# Patient Record
Sex: Female | Born: 1975 | Race: White | Hispanic: No | Marital: Single
Health system: Southern US, Academic
[De-identification: ages and names within clinical notes are randomized; demographics above are authoritative.]

## PROBLEM LIST (undated history)

## (undated) ENCOUNTER — Encounter: Attending: Infectious Disease | Primary: Infectious Disease

## (undated) ENCOUNTER — Ambulatory Visit: Payer: Medicaid (Managed Care)

## (undated) ENCOUNTER — Encounter

## (undated) ENCOUNTER — Ambulatory Visit: Payer: PRIVATE HEALTH INSURANCE | Attending: Internal Medicine | Primary: Internal Medicine

## (undated) ENCOUNTER — Ambulatory Visit

## (undated) ENCOUNTER — Ambulatory Visit: Attending: Social Worker | Primary: Social Worker

## (undated) ENCOUNTER — Encounter: Attending: Internal Medicine | Primary: Internal Medicine

## (undated) ENCOUNTER — Encounter
Attending: Student in an Organized Health Care Education/Training Program | Primary: Student in an Organized Health Care Education/Training Program

## (undated) ENCOUNTER — Ambulatory Visit
Attending: Rehabilitative and Restorative Service Providers" | Primary: Rehabilitative and Restorative Service Providers"

## (undated) ENCOUNTER — Ambulatory Visit: Attending: Foot & Ankle Surgery | Primary: Foot & Ankle Surgery

## (undated) ENCOUNTER — Ambulatory Visit: Attending: Internal Medicine | Primary: Internal Medicine

## (undated) ENCOUNTER — Telehealth: Attending: Internal Medicine | Primary: Internal Medicine

## (undated) ENCOUNTER — Telehealth

## (undated) ENCOUNTER — Ambulatory Visit: Attending: Registered" | Primary: Registered"

## (undated) ENCOUNTER — Ambulatory Visit: Payer: PRIVATE HEALTH INSURANCE | Attending: Adult Health | Primary: Adult Health

## (undated) ENCOUNTER — Encounter: Attending: Nurse Practitioner | Primary: Nurse Practitioner

## (undated) ENCOUNTER — Encounter: Attending: Registered" | Primary: Registered"

## (undated) ENCOUNTER — Telehealth
Attending: Student in an Organized Health Care Education/Training Program | Primary: Student in an Organized Health Care Education/Training Program

## (undated) ENCOUNTER — Ambulatory Visit: Attending: Vascular Surgery | Primary: Vascular Surgery

## (undated) ENCOUNTER — Encounter: Attending: Dermatology | Primary: Dermatology

## (undated) ENCOUNTER — Ambulatory Visit
Payer: Medicaid (Managed Care) | Attending: Rehabilitative and Restorative Service Providers" | Primary: Rehabilitative and Restorative Service Providers"

## (undated) ENCOUNTER — Ambulatory Visit: Payer: PRIVATE HEALTH INSURANCE

## (undated) ENCOUNTER — Encounter: Attending: Diagnostic Radiology | Primary: Diagnostic Radiology

## (undated) ENCOUNTER — Encounter
Attending: Rehabilitative and Restorative Service Providers" | Primary: Rehabilitative and Restorative Service Providers"

## (undated) ENCOUNTER — Encounter: Attending: Obstetrics & Gynecology | Primary: Obstetrics & Gynecology

## (undated) ENCOUNTER — Ambulatory Visit
Payer: BLUE CROSS/BLUE SHIELD | Attending: Student in an Organized Health Care Education/Training Program | Primary: Student in an Organized Health Care Education/Training Program

## (undated) ENCOUNTER — Ambulatory Visit
Payer: PRIVATE HEALTH INSURANCE | Attending: Rehabilitative and Restorative Service Providers" | Primary: Rehabilitative and Restorative Service Providers"

## (undated) ENCOUNTER — Encounter: Attending: Medical | Primary: Medical

## (undated) ENCOUNTER — Encounter: Payer: Medicaid (Managed Care) | Attending: Internal Medicine | Primary: Internal Medicine

## (undated) ENCOUNTER — Encounter: Attending: Adult Health | Primary: Adult Health

## (undated) ENCOUNTER — Encounter: Attending: Rheumatology | Primary: Rheumatology

## (undated) ENCOUNTER — Ambulatory Visit
Attending: Student in an Organized Health Care Education/Training Program | Primary: Student in an Organized Health Care Education/Training Program

## (undated) ENCOUNTER — Ambulatory Visit: Payer: BLUE CROSS/BLUE SHIELD | Attending: Internal Medicine | Primary: Internal Medicine

## (undated) ENCOUNTER — Ambulatory Visit: Payer: PRIVATE HEALTH INSURANCE | Attending: Obstetrics & Gynecology | Primary: Obstetrics & Gynecology

## (undated) ENCOUNTER — Ambulatory Visit
Payer: PRIVATE HEALTH INSURANCE | Attending: Student in an Organized Health Care Education/Training Program | Primary: Student in an Organized Health Care Education/Training Program

## (undated) ENCOUNTER — Ambulatory Visit: Payer: PRIVATE HEALTH INSURANCE | Attending: Rheumatology | Primary: Rheumatology

## (undated) ENCOUNTER — Inpatient Hospital Stay: Payer: Medicaid (Managed Care)

## (undated) ENCOUNTER — Ambulatory Visit: Attending: Rheumatology | Primary: Rheumatology

## (undated) ENCOUNTER — Institutional Professional Consult (permissible substitution): Attending: Internal Medicine | Primary: Internal Medicine

## (undated) ENCOUNTER — Ambulatory Visit: Payer: Medicaid (Managed Care) | Attending: Internal Medicine | Primary: Internal Medicine

## (undated) ENCOUNTER — Telehealth: Attending: Vascular Surgery | Primary: Vascular Surgery

## (undated) ENCOUNTER — Ambulatory Visit: Attending: Obstetrics & Gynecology | Primary: Obstetrics & Gynecology

## (undated) ENCOUNTER — Encounter: Attending: Psychiatry | Primary: Psychiatry

## (undated) ENCOUNTER — Encounter: Payer: Medicaid (Managed Care) | Attending: Psychiatry | Primary: Psychiatry

## (undated) ENCOUNTER — Ambulatory Visit
Payer: BLUE CROSS/BLUE SHIELD | Attending: Physical Medicine & Rehabilitation | Primary: Physical Medicine & Rehabilitation

## (undated) ENCOUNTER — Ambulatory Visit: Payer: BLUE CROSS/BLUE SHIELD

## (undated) ENCOUNTER — Inpatient Hospital Stay

## (undated) ENCOUNTER — Encounter: Attending: Vascular Surgery | Primary: Vascular Surgery

## (undated) ENCOUNTER — Telehealth: Attending: Ambulatory Care | Primary: Ambulatory Care

## (undated) DIAGNOSIS — K219 Gastro-esophageal reflux disease without esophagitis: Secondary | ICD-10-CM

## (undated) DIAGNOSIS — S83519A Sprain of anterior cruciate ligament of unspecified knee, initial encounter: Secondary | ICD-10-CM

## (undated) DIAGNOSIS — K529 Noninfective gastroenteritis and colitis, unspecified: Secondary | ICD-10-CM

## (undated) DIAGNOSIS — M359 Systemic involvement of connective tissue, unspecified: Secondary | ICD-10-CM

## (undated) DIAGNOSIS — R519 Headache, unspecified: Secondary | ICD-10-CM

## (undated) DIAGNOSIS — K5792 Diverticulitis of intestine, part unspecified, without perforation or abscess without bleeding: Secondary | ICD-10-CM

## (undated) DIAGNOSIS — R51 Headache: Secondary | ICD-10-CM

## (undated) DIAGNOSIS — F419 Anxiety disorder, unspecified: Secondary | ICD-10-CM

## (undated) DIAGNOSIS — S83209A Unspecified tear of unspecified meniscus, current injury, unspecified knee, initial encounter: Secondary | ICD-10-CM

## (undated) DIAGNOSIS — I7300 Raynaud's syndrome without gangrene: Secondary | ICD-10-CM

## (undated) DIAGNOSIS — F329 Major depressive disorder, single episode, unspecified: Secondary | ICD-10-CM

## (undated) DIAGNOSIS — M1990 Unspecified osteoarthritis, unspecified site: Secondary | ICD-10-CM

## (undated) DIAGNOSIS — Q796 Ehlers-Danlos syndrome, unspecified: Secondary | ICD-10-CM

## (undated) DIAGNOSIS — F32A Depression, unspecified: Secondary | ICD-10-CM

## (undated) DIAGNOSIS — I499 Cardiac arrhythmia, unspecified: Secondary | ICD-10-CM

## (undated) DIAGNOSIS — F410 Panic disorder [episodic paroxysmal anxiety] without agoraphobia: Secondary | ICD-10-CM

## (undated) HISTORY — PX: FOOT SURGERY: SHX648

## (undated) HISTORY — PX: TUBAL LIGATION: SHX77

---
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
## 1997-05-11 ENCOUNTER — Inpatient Hospital Stay (HOSPITAL_COMMUNITY): Admission: AD | Admit: 1997-05-11 | Discharge: 1997-05-11 | Payer: Self-pay | Admitting: Podiatrist

## 1997-06-02 ENCOUNTER — Other Ambulatory Visit: Admission: RE | Admit: 1997-06-02 | Discharge: 1997-06-02 | Payer: Self-pay | Admitting: Obstetrics and Gynecology

## 1997-10-14 ENCOUNTER — Emergency Department (HOSPITAL_COMMUNITY): Admission: EM | Admit: 1997-10-14 | Discharge: 1997-10-14 | Payer: Self-pay | Admitting: Emergency Medicine

## 1997-12-09 ENCOUNTER — Inpatient Hospital Stay (HOSPITAL_COMMUNITY): Admission: AD | Admit: 1997-12-09 | Discharge: 1997-12-09 | Payer: Self-pay | Admitting: Obstetrics and Gynecology

## 1997-12-16 ENCOUNTER — Inpatient Hospital Stay (HOSPITAL_COMMUNITY): Admission: AD | Admit: 1997-12-16 | Discharge: 1997-12-18 | Payer: Self-pay | Admitting: Obstetrics and Gynecology

## 1997-12-19 ENCOUNTER — Inpatient Hospital Stay (HOSPITAL_COMMUNITY): Admission: AD | Admit: 1997-12-19 | Discharge: 1997-12-19 | Payer: Self-pay | Admitting: Obstetrics and Gynecology

## 1998-01-29 ENCOUNTER — Other Ambulatory Visit: Admission: RE | Admit: 1998-01-29 | Discharge: 1998-01-29 | Payer: Self-pay | Admitting: Obstetrics and Gynecology

## 1999-02-18 ENCOUNTER — Other Ambulatory Visit: Admission: RE | Admit: 1999-02-18 | Discharge: 1999-02-18 | Payer: Self-pay | Admitting: Obstetrics and Gynecology

## 2007-07-09 ENCOUNTER — Inpatient Hospital Stay: Payer: Self-pay | Admitting: Internal Medicine

## 2007-08-08 ENCOUNTER — Emergency Department: Payer: Self-pay | Admitting: Emergency Medicine

## 2007-10-29 ENCOUNTER — Emergency Department: Payer: Self-pay | Admitting: Emergency Medicine

## 2008-04-04 ENCOUNTER — Emergency Department (HOSPITAL_COMMUNITY): Admission: EM | Admit: 2008-04-04 | Discharge: 2008-04-04 | Payer: Self-pay | Admitting: Emergency Medicine

## 2008-07-07 ENCOUNTER — Emergency Department: Payer: Self-pay | Admitting: Emergency Medicine

## 2009-01-31 ENCOUNTER — Emergency Department: Payer: Self-pay | Admitting: Emergency Medicine

## 2009-05-11 ENCOUNTER — Emergency Department: Payer: Self-pay | Admitting: Emergency Medicine

## 2010-02-18 ENCOUNTER — Emergency Department: Payer: Self-pay | Admitting: Emergency Medicine

## 2010-05-05 LAB — POCT I-STAT, CHEM 8
Glucose, Bld: 82 mg/dL (ref 70–99)
HCT: 44 % (ref 36.0–46.0)
Hemoglobin: 15 g/dL (ref 12.0–15.0)
Potassium: 3.9 mEq/L (ref 3.5–5.1)
Sodium: 140 mEq/L (ref 135–145)

## 2010-05-05 LAB — DIFFERENTIAL
Basophils Relative: 0 % (ref 0–1)
Eosinophils Absolute: 0.1 10*3/uL (ref 0.0–0.7)
Monocytes Relative: 5 % (ref 3–12)
Neutrophils Relative %: 76 % (ref 43–77)

## 2010-05-05 LAB — CBC
MCHC: 34.7 g/dL (ref 30.0–36.0)
MCV: 89.9 fL (ref 78.0–100.0)
Platelets: 168 10*3/uL (ref 150–400)
RDW: 12.7 % (ref 11.5–15.5)

## 2010-05-05 LAB — URINALYSIS, ROUTINE W REFLEX MICROSCOPIC
Bilirubin Urine: NEGATIVE
Ketones, ur: NEGATIVE mg/dL
Nitrite: NEGATIVE
Protein, ur: NEGATIVE mg/dL
Urobilinogen, UA: 1 mg/dL (ref 0.0–1.0)

## 2010-05-05 LAB — POCT CARDIAC MARKERS: CKMB, poc: <1 ng/mL — ABNORMAL LOW (ref 1.0–8.0)

## 2010-06-25 ENCOUNTER — Emergency Department: Payer: Self-pay | Admitting: Internal Medicine

## 2011-07-26 ENCOUNTER — Emergency Department: Payer: Self-pay

## 2012-08-16 ENCOUNTER — Emergency Department: Payer: Self-pay | Admitting: Emergency Medicine

## 2012-08-16 LAB — URINALYSIS, COMPLETE
Glucose,UR: NEGATIVE mg/dL (ref 0–75)
Leukocyte Esterase: NEGATIVE
Nitrite: NEGATIVE
Ph: 5 (ref 4.5–8.0)
Specific Gravity: 1.013 (ref 1.003–1.030)
Squamous Epithelial: 10

## 2012-08-16 LAB — CBC
HCT: 41.3 % (ref 35.0–47.0)
MCHC: 35 g/dL (ref 32.0–36.0)
MCV: 87 fL (ref 80–100)
RBC: 4.75 10*6/uL (ref 3.80–5.20)
RDW: 13.2 % (ref 11.5–14.5)
WBC: 14.6 10*3/uL — ABNORMAL HIGH (ref 3.6–11.0)

## 2012-08-16 LAB — COMPREHENSIVE METABOLIC PANEL
Alkaline Phosphatase: 106 U/L (ref 50–136)
BUN: 7 mg/dL (ref 7–18)
Bilirubin,Total: 0.7 mg/dL (ref 0.2–1.0)
Calcium, Total: 9.2 mg/dL (ref 8.5–10.1)
Chloride: 105 mmol/L (ref 98–107)
Co2: 29 mmol/L (ref 21–32)
Creatinine: 0.66 mg/dL (ref 0.60–1.30)
EGFR (African American): >60
EGFR (Non-African Amer.): >60
Glucose: 90 mg/dL (ref 65–99)
Potassium: 3.7 mmol/L (ref 3.5–5.1)
SGPT (ALT): 18 U/L (ref 12–78)
Sodium: 138 mmol/L (ref 136–145)

## 2012-11-17 ENCOUNTER — Emergency Department: Payer: Self-pay | Admitting: Emergency Medicine

## 2013-02-27 ENCOUNTER — Emergency Department: Payer: Self-pay | Admitting: Emergency Medicine

## 2013-02-27 LAB — COMPREHENSIVE METABOLIC PANEL
ALBUMIN: 3.9 g/dL (ref 3.4–5.0)
ALK PHOS: 101 U/L
Anion Gap: 5 — ABNORMAL LOW (ref 7–16)
BILIRUBIN TOTAL: 0.4 mg/dL (ref 0.2–1.0)
BUN: 10 mg/dL (ref 7–18)
CALCIUM: 8.9 mg/dL (ref 8.5–10.1)
CO2: 28 mmol/L (ref 21–32)
Chloride: 104 mmol/L (ref 98–107)
Creatinine: 0.65 mg/dL (ref 0.60–1.30)
EGFR (African American): >60
EGFR (Non-African Amer.): >60
Glucose: 89 mg/dL (ref 65–99)
OSMOLALITY: 272 (ref 275–301)
POTASSIUM: 4 mmol/L (ref 3.5–5.1)
SGOT(AST): 13 U/L — ABNORMAL LOW (ref 15–37)
SGPT (ALT): 19 U/L (ref 12–78)
SODIUM: 137 mmol/L (ref 136–145)
Total Protein: 7.7 g/dL (ref 6.4–8.2)

## 2013-02-27 LAB — CBC WITH DIFFERENTIAL/PLATELET
BASOS PCT: 0.7 %
Basophil #: 0.1 10*3/uL (ref 0.0–0.1)
EOS PCT: 1 %
Eosinophil #: 0.1 10*3/uL (ref 0.0–0.7)
HCT: 43.8 % (ref 35.0–47.0)
HGB: 14.8 g/dL (ref 12.0–16.0)
LYMPHS ABS: 2.5 10*3/uL (ref 1.0–3.6)
LYMPHS PCT: 25.1 %
MCH: 30.5 pg (ref 26.0–34.0)
MCHC: 33.9 g/dL (ref 32.0–36.0)
MCV: 90 fL (ref 80–100)
Monocyte #: 0.6 x10 3/mm (ref 0.2–0.9)
Monocyte %: 5.8 %
NEUTROS ABS: 6.7 10*3/uL — AB (ref 1.4–6.5)
NEUTROS PCT: 67.4 %
PLATELETS: 169 10*3/uL (ref 150–440)
RBC: 4.87 10*6/uL (ref 3.80–5.20)
RDW: 13.2 % (ref 11.5–14.5)
WBC: 9.9 10*3/uL (ref 3.6–11.0)

## 2013-02-27 LAB — URINALYSIS, COMPLETE
BLOOD: NEGATIVE
Bilirubin,UR: NEGATIVE
GLUCOSE, UR: NEGATIVE mg/dL (ref 0–75)
Ketone: NEGATIVE
NITRITE: NEGATIVE
PH: 5 (ref 4.5–8.0)
PROTEIN: NEGATIVE
RBC,UR: 3 /HPF (ref 0–5)
SPECIFIC GRAVITY: 1.009 (ref 1.003–1.030)

## 2013-04-18 ENCOUNTER — Encounter (HOSPITAL_COMMUNITY): Payer: Self-pay | Admitting: Emergency Medicine

## 2013-04-18 ENCOUNTER — Emergency Department (HOSPITAL_COMMUNITY)
Admission: EM | Admit: 2013-04-18 | Discharge: 2013-04-18 | Disposition: A | Discharge status: Home / Self Care | Payer: No Typology Code available for payment source | Source: Home / Self Care

## 2013-04-18 ENCOUNTER — Other Ambulatory Visit (HOSPITAL_COMMUNITY)
Admission: RE | Admit: 2013-04-18 | Discharge: 2013-04-18 | Disposition: A | Discharge status: Home / Self Care | Payer: No Typology Code available for payment source | Source: Ambulatory Visit | Attending: Emergency Medicine | Admitting: Emergency Medicine

## 2013-04-18 DIAGNOSIS — R109 Unspecified abdominal pain: Secondary | ICD-10-CM

## 2013-04-18 DIAGNOSIS — Z113 Encounter for screening for infections with a predominantly sexual mode of transmission: Secondary | ICD-10-CM | POA: Insufficient documentation

## 2013-04-18 HISTORY — DX: Ehlers-Danlos syndrome, unspecified: Q79.60

## 2013-04-18 HISTORY — DX: Diverticulitis of intestine, part unspecified, without perforation or abscess without bleeding: K57.92

## 2013-04-18 LAB — POCT I-STAT, CHEM 8
BUN: 13 mg/dL (ref 6–23)
CALCIUM ION: 1.06 mmol/L — AB (ref 1.12–1.23)
Chloride: 105 mEq/L (ref 96–112)
Creatinine, Ser: 0.6 mg/dL (ref 0.50–1.10)
GLUCOSE: 90 mg/dL (ref 70–99)
HEMATOCRIT: 44 % (ref 36.0–46.0)
HEMOGLOBIN: 15 g/dL (ref 12.0–15.0)
Potassium: 4 mEq/L (ref 3.7–5.3)
Sodium: 141 mEq/L (ref 137–147)
TCO2: 25 mmol/L (ref 0–100)

## 2013-04-18 LAB — POCT URINALYSIS DIP (DEVICE)
BILIRUBIN URINE: NEGATIVE
Glucose, UA: NEGATIVE mg/dL
KETONES UR: NEGATIVE mg/dL
LEUKOCYTES UA: NEGATIVE
NITRITE: NEGATIVE
PH: 5.5 (ref 5.0–8.0)
PROTEIN: NEGATIVE mg/dL
Specific Gravity, Urine: 1.025 (ref 1.005–1.030)
Urobilinogen, UA: 1 mg/dL (ref 0.0–1.0)

## 2013-04-18 LAB — POCT PREGNANCY, URINE: PREG TEST UR: NEGATIVE

## 2013-04-18 MED ORDER — CIPROFLOXACIN HCL 500 MG PO TABS: 500.0000 mg | ORAL_TABLET | Freq: Two times a day (BID) | ORAL | Status: DC

## 2013-04-18 MED ORDER — METRONIDAZOLE 500 MG PO TABS: 500.0000 mg | ORAL_TABLET | Freq: Three times a day (TID) | ORAL | Status: DC

## 2013-04-18 NOTE — Discharge Instructions (Signed)
Follow up with your primary care provider as needed for further evaluation and work up.

## 2013-04-18 NOTE — ED Notes (Signed)
Pt c/o lower abd pain/poss UTI onset 4 days Sxs include: constant abd pain, back pain, urinary freq and feeling nauseas Denies hematuria, vag d/c, inj/trauma, f/v/d Taking advil and tyle w/no relief Alert w/no signs of acute distress.

## 2013-04-18 NOTE — ED Provider Notes (Signed)
CSN: [id number]     Arrival date & time 04/18/13  1846 History   None    Chief Complaint  Patient presents with  . Abdominal Pain   (Consider location/radiation/quality/duration/timing/severity/associated sxs/prior Treatment)  HPI  The patient is a 38 year old female presenting tonight with reports of an uncomfortable or incomplete sensation following urination for the past few days. In addition the patient states that she has a history of diverticulitis.  Patient states that she is uncertain as to whether or not her discomfort is related to diverticulitis or the beginning of a urinary tract infection.  Past Medical History  Diagnosis Date  . Diverticulitis   . Ehlers-Danlos disease    Past Surgical History  Procedure Laterality Date  . Tubal ligation Bilateral   . Foot surgery     No family history on file. History  Substance Use Topics  . Smoking status: Never Smoker   . Smokeless tobacco: Not on file  . Alcohol Use: Yes   OB History   Grav Para Term Preterm Abortions TAB SAB Ect Mult Living                 Review of Systems  Constitutional: Negative.  Negative for fever, chills, diaphoresis and fatigue.  HENT: Negative.   Eyes: Negative.   Respiratory: Negative.  Negative for cough, chest tightness and shortness of breath.   Cardiovascular: Negative.  Negative for chest pain and leg swelling.  Gastrointestinal: Positive for nausea, abdominal pain, diarrhea and constipation. Negative for vomiting, blood in stool, abdominal distention and anal bleeding.  Endocrine: Negative.   Genitourinary: Positive for frequency. Negative for dysuria, urgency, hematuria, flank pain, decreased urine volume, vaginal bleeding, vaginal discharge, genital sores, vaginal pain, menstrual problem, pelvic pain and dyspareunia.       The patient reports intermittent burning sensation with urination at the end of voiding. In addition, the patient states that she has the sensation of incomplete  emptying at times.  Musculoskeletal: Negative.   Skin: Negative.   Allergic/Immunologic:       The patient states he was diagnosed with a connective tissue disorder at age 4.  Neurological: Negative.   Hematological: Negative.   Psychiatric/Behavioral: Negative.     Allergies  Septra  Home Medications   Current Outpatient Rx  Name  Route  Sig  Dispense  Refill  . omeprazole (PRILOSEC) 20 MG capsule   Oral   Take 20 mg by mouth daily.         . ciprofloxacin (CIPRO) 500 MG tablet   Oral   Take 1 tablet (500 mg total) by mouth 2 (two) times daily.   28 tablet   0   . metroNIDAZOLE (FLAGYL) 500 MG tablet   Oral   Take 1 tablet (500 mg total) by mouth 3 (three) times daily.   21 tablet   0    BP 156/96  Pulse 68  Temp(Src) 97.5 F (36.4 C) (Oral)  Resp 18  SpO2 98%  LMP 04/07/2013  Physical Exam  Nursing note and vitals reviewed. Constitutional: She appears well-developed and well-nourished. No distress.  Cardiovascular: Normal rate, normal heart sounds and intact distal pulses.  Exam reveals no gallop and no friction rub.   No murmur heard. The patient's rhythm is regularly irregular.  Pulmonary/Chest: Effort normal and breath sounds normal. No respiratory distress. She has no wheezes. She has no rales. She exhibits no tenderness.  Abdominal: Soft. Bowel sounds are normal. She exhibits no distension and no mass. There  is no tenderness. There is no rebound and no guarding.  No organomegaly noted upon examination. Examination limited by body habitus.  Genitourinary:  The patient denies vaginal discharge itching or discomfort.  The patient states she's had consensual sexual relationship but has not been tested for sexually-transmitted infection for 15 years.  Musculoskeletal:  See HPI.  Skin: Skin is warm and dry. No rash noted. She is not diaphoretic. No erythema. No pallor.  Skin appears thin and fragile.  Consistent with diagnosis of Ehler's Danlos.     ED  Course  Procedures (including critical care time) Labs Review Labs Reviewed  POCT URINALYSIS DIP (DEVICE) - Abnormal; Notable for the following:    Hgb urine dipstick TRACE (*)    All other components within normal limits  POCT I-STAT, CHEM 8 - Abnormal; Notable for the following:    Calcium, Ion 1.06 (*)    All other components within normal limits  POCT PREGNANCY, URINE  URINE CYTOLOGY ANCILLARY ONLY   Imaging Review No results found.  Gonorrhea and Chlamydia results are pending.  MDM   1. Abdominal pain    Meds ordered this encounter  Medications  . omeprazole (PRILOSEC) 20 MG capsule    Sig: Take 20 mg by mouth daily.  . ciprofloxacin (CIPRO) 500 MG tablet    Sig: Take 1 tablet (500 mg total) by mouth 2 (two) times daily.    Dispense:  28 tablet    Refill:  0  . metroNIDAZOLE (FLAGYL) 500 MG tablet    Sig: Take 1 tablet (500 mg total) by mouth 3 (three) times daily.    Dispense:  21 tablet    Refill:  0       Weber Cooksatherine Ruhama Lehew, NP 04/18/13 2155

## 2013-04-19 NOTE — ED Provider Notes (Signed)
Medical screening examination/treatment/procedure(s) were performed by non-physician practitioner and as supervising physician I was immediately available for consultation/collaboration.  Lillan Mccreadie, M.D.  Aleisha Paone C Vanessa Alesi, MD 04/19/13 0843 

## 2013-04-21 LAB — URINE CYTOLOGY ANCILLARY ONLY
CHLAMYDIA, DNA PROBE: NEGATIVE
Neisseria Gonorrhea: NEGATIVE

## 2014-01-30 ENCOUNTER — Emergency Department: Payer: Self-pay | Admitting: Emergency Medicine

## 2014-01-30 LAB — COMPREHENSIVE METABOLIC PANEL
ALBUMIN: 3.6 g/dL (ref 3.4–5.0)
ALK PHOS: 96 U/L
Anion Gap: 6 — ABNORMAL LOW (ref 7–16)
BUN: 5 mg/dL — AB (ref 7–18)
Bilirubin,Total: 0.5 mg/dL (ref 0.2–1.0)
CALCIUM: 8.9 mg/dL (ref 8.5–10.1)
Chloride: 107 mmol/L (ref 98–107)
Co2: 27 mmol/L (ref 21–32)
Creatinine: 0.77 mg/dL (ref 0.60–1.30)
EGFR (African American): >60
EGFR (Non-African Amer.): >60
Glucose: 98 mg/dL (ref 65–99)
OSMOLALITY: 277 (ref 275–301)
POTASSIUM: 4.2 mmol/L (ref 3.5–5.1)
SGOT(AST): 24 U/L (ref 15–37)
SGPT (ALT): 28 U/L
Sodium: 140 mmol/L (ref 136–145)
TOTAL PROTEIN: 7.3 g/dL (ref 6.4–8.2)

## 2014-01-30 LAB — URINALYSIS, COMPLETE
BILIRUBIN, UR: NEGATIVE
Bacteria: NONE SEEN
Glucose,UR: NEGATIVE mg/dL (ref 0–75)
Ketone: NEGATIVE
NITRITE: NEGATIVE
PH: 7 (ref 4.5–8.0)
PROTEIN: NEGATIVE
RBC,UR: <1 /HPF (ref 0–5)
Specific Gravity: 1.002 (ref 1.003–1.030)
WBC UR: 1 /HPF (ref 0–5)

## 2014-01-30 LAB — CBC WITH DIFFERENTIAL/PLATELET
BASOS ABS: 0 10*3/uL (ref 0.0–0.1)
Basophil %: 0.6 %
Eosinophil #: 0.1 10*3/uL (ref 0.0–0.7)
Eosinophil %: 0.8 %
HCT: 44.3 % (ref 35.0–47.0)
HGB: 14.8 g/dL (ref 12.0–16.0)
LYMPHS ABS: 1.7 10*3/uL (ref 1.0–3.6)
LYMPHS PCT: 26.3 %
MCH: 30.3 pg (ref 26.0–34.0)
MCHC: 33.4 g/dL (ref 32.0–36.0)
MCV: 91 fL (ref 80–100)
MONOS PCT: 6.2 %
Monocyte #: 0.4 x10 3/mm (ref 0.2–0.9)
Neutrophil #: 4.3 10*3/uL (ref 1.4–6.5)
Neutrophil %: 66.1 %
PLATELETS: 197 10*3/uL (ref 150–440)
RBC: 4.9 10*6/uL (ref 3.80–5.20)
RDW: 12.9 % (ref 11.5–14.5)
WBC: 6.5 10*3/uL (ref 3.6–11.0)

## 2014-01-30 LAB — LIPASE, BLOOD: Lipase: 116 U/L (ref 73–393)

## 2014-02-01 LAB — URINE CULTURE

## 2015-07-13 ENCOUNTER — Emergency Department: Payer: No Typology Code available for payment source

## 2015-07-13 ENCOUNTER — Encounter: Payer: Self-pay | Admitting: Emergency Medicine

## 2015-07-13 ENCOUNTER — Emergency Department
Admission: EM | Admit: 2015-07-13 | Discharge: 2015-07-13 | Disposition: A | Discharge status: Home / Self Care | Payer: No Typology Code available for payment source | Attending: Emergency Medicine | Admitting: Emergency Medicine

## 2015-07-13 DIAGNOSIS — I890 Lymphedema, not elsewhere classified: Secondary | ICD-10-CM

## 2015-07-13 DIAGNOSIS — R609 Edema, unspecified: Secondary | ICD-10-CM

## 2015-07-13 DIAGNOSIS — M7989 Other specified soft tissue disorders: Secondary | ICD-10-CM | POA: Insufficient documentation

## 2015-07-13 DIAGNOSIS — L02415 Cutaneous abscess of right lower limb: Secondary | ICD-10-CM | POA: Insufficient documentation

## 2015-07-13 DIAGNOSIS — Z7982 Long term (current) use of aspirin: Secondary | ICD-10-CM | POA: Insufficient documentation

## 2015-07-13 LAB — BASIC METABOLIC PANEL
Anion gap: 10 (ref 5–15)
BUN: 13 mg/dL (ref 6–20)
CALCIUM: 8.8 mg/dL — AB (ref 8.9–10.3)
CHLORIDE: 103 mmol/L (ref 101–111)
CO2: 24 mmol/L (ref 22–32)
CREATININE: 0.76 mg/dL (ref 0.44–1.00)
GFR calc non Af Amer: >60 mL/min (ref >60)
Glucose, Bld: 108 mg/dL — ABNORMAL HIGH (ref 65–99)
Potassium: 3.3 mmol/L — ABNORMAL LOW (ref 3.5–5.1)
SODIUM: 137 mmol/L (ref 135–145)

## 2015-07-13 LAB — CBC
HCT: 39.3 % (ref 35.0–47.0)
Hemoglobin: 13.6 g/dL (ref 12.0–16.0)
MCH: 31.2 pg (ref 26.0–34.0)
MCHC: 34.6 g/dL (ref 32.0–36.0)
MCV: 90.3 fL (ref 80.0–100.0)
PLATELETS: 199 10*3/uL (ref 150–440)
RBC: 4.36 MIL/uL (ref 3.80–5.20)
RDW: 14.1 % (ref 11.5–14.5)
WBC: 14.3 10*3/uL — AB (ref 3.6–11.0)

## 2015-07-13 MED ORDER — OXYCODONE-ACETAMINOPHEN 5-325 MG PO TABS
2.0000 | ORAL_TABLET | Freq: Once | ORAL | Status: AC
  Administered 2015-07-13: 2 via ORAL
  Filled 2015-07-13: qty 2

## 2015-07-13 MED ORDER — LIDOCAINE HCL (PF) 1 % IJ SOLN
INTRAMUSCULAR | Status: AC
  Filled 2015-07-13: qty 10

## 2015-07-13 MED ORDER — LIDOCAINE HCL (PF) 1 % IJ SOLN: 5.0000 mL | Freq: Once | INTRAMUSCULAR | Status: DC

## 2015-07-13 MED ORDER — LIDOCAINE HCL 2 % IJ SOLN
10.0000 mL | Freq: Once | INTRAMUSCULAR | Status: AC
  Administered 2015-07-13: 200 mg via INTRADERMAL
  Filled 2015-07-13: qty 10

## 2015-07-13 MED ORDER — LIDOCAINE HCL 2 % IJ SOLN
10.0000 mL | Freq: Once | INTRAMUSCULAR | Status: DC
  Filled 2015-07-13: qty 10

## 2015-07-13 NOTE — ED Notes (Signed)
Patient transported to X-ray 

## 2015-07-13 NOTE — Discharge Instructions (Signed)
Keep leg elevated.   Wear compression stockings.   Expect drainage from the wound, change dressing when it gets soaked   Return to ED for wound check in 2 days in the morning.   Return to ED sooner if you have severe pain, fevers, purulent discharge from wound

## 2015-07-13 NOTE — ED Provider Notes (Signed)
CSN: [id number]     Arrival date & time 07/13/15  0557 History   First MD Initiated Contact with Patient 07/13/15 0759     Chief Complaint  Patient presents with  . Leg Swelling     (Consider location/radiation/quality/duration/timing/severity/associated sxs/prior Treatment) The history is provided by the patient.  Felicity Coyermanda P Wingard is a 40 y.o. female hx of ehler's danlos syndrome, diverticulitis here with R leg swelling. States that she fell and hit her leg about 5 weeks ago. For the last week she noticed worsening swelling of the right lower leg. She states that since yesterday the leg swelling has been much worse. She has been on her feet a lot. She states that there is an area that became more red and swollen right below the knee. She denies any knee pain or any knee infections the past. She states that she does have a history of cellulitis in the past But does not remember having MRSA. Denies any fevers or chills. States that she has been walking on it.    Past Medical History  Diagnosis Date  . Diverticulitis   . Ehlers-Danlos disease    Past Surgical History  Procedure Laterality Date  . Tubal ligation Bilateral   . Foot surgery     History reviewed. No pertinent family history. Social History  Substance Use Topics  . Smoking status: Never Smoker   . Smokeless tobacco: None  . Alcohol Use: Yes   OB History    No data available     Review of Systems  Musculoskeletal:       R leg pain   All other systems reviewed and are negative.     Allergies  Morphine and related and Septra  Home Medications   Prior to Admission medications   Medication Sig Start Date End Date Taking? Authorizing Provider  aspirin EC 325 MG tablet Take 325 mg by mouth at bedtime as needed for mild pain or moderate pain.   Yes Historical Provider, MD  ibuprofen (ADVIL,MOTRIN) 200 MG tablet Take 400 mg by mouth every 6 (six) hours as needed for mild pain or moderate pain.   Yes Historical  Provider, MD  omeprazole (PRILOSEC) 20 MG capsule Take 20 mg by mouth 2 (two) times daily.    Yes Historical Provider, MD   BP 134/90 mmHg  Pulse 108  Temp(Src) 97.7 F (36.5 C) (Oral)  Resp 18  Ht 5\' 6"  (1.676 m)  Wt 321 lb (145.605 kg)  BMI 51.84 kg/m2  SpO2 97%  LMP 07/09/2015 (Exact Date) Physical Exam  Constitutional: She is oriented to person, place, and time. She appears well-developed and well-nourished.  Overweight   HENT:  Head: Normocephalic.  Mouth/Throat: Oropharynx is clear and moist.  Eyes: Conjunctivae are normal. Pupils are equal, round, and reactive to light.  Neck: Normal range of motion. Neck supple.  Cardiovascular: Normal rate, regular rhythm and normal heart sounds.   Pulmonary/Chest: Effort normal and breath sounds normal. No respiratory distress. She has no wheezes. She has no rales.  Abdominal: Soft. Bowel sounds are normal. She exhibits no distension. There is no tenderness. There is no rebound.  Musculoskeletal: Normal range of motion.  R anterior tibia with mild erythema and swelling, + fluctuance. Nl ROM R knee. 2+ pulses    Neurological: She is alert and oriented to person, place, and time. No cranial nerve deficit. Coordination normal.  Skin: Skin is warm and dry. There is erythema.  Psychiatric: She has a normal mood and  affect. Her behavior is normal. Judgment and thought content normal.  Nursing note and vitals reviewed.   ED Course  Procedures (including critical care time)  INCISION AND DRAINAGE Performed by: Silverio Lay, Carlas Vandyne Consent: Verbal consent obtained. Risks and benefits: risks, benefits and alternatives were discussed Type: abscess  Body area: R shin  Anesthesia: local infiltration  Incision was made with a scalpel.  Local anesthetic: lidocaine 2 % no epinephrine  Anesthetic total: 10 ml  Complexity: complex Blunt dissection  Drainage: serosanguinous  Drainage amount: copious (about 30 cc)  Packing material:  none  Patient tolerance: Patient tolerated the procedure well with no immediate complications.     Labs Review Labs Reviewed  BASIC METABOLIC PANEL - Abnormal; Notable for the following:    Potassium 3.3 (*)    Glucose, Bld 108 (*)    Calcium 8.8 (*)    All other components within normal limits  CBC - Abnormal; Notable for the following:    WBC 14.3 (*)    All other components within normal limits    Imaging Review Dg Tibia/fibula Right  07/13/2015  CLINICAL DATA:  Fall. EXAM: RIGHT TIBIA AND FIBULA - 2 VIEW COMPARISON:  No recent . FINDINGS: No acute bony or joint abnormality. No focal abnormality. Venous calcifications noted. IMPRESSION: No acute bony or joint abnormality.  Venous calcifications noted . Electronically Signed   By: Maisie Fus  Register   On: 07/13/2015 08:59   US Venous Img Lower Unilateral Right  07/13/2015  CLINICAL DATA:  Right lower extremity swelling and redness. EXAM: RIGHT LOWER EXTREMITY VENOUS DOPPLER ULTRASOUND TECHNIQUE: Gray-scale sonography with graded compression, as well as color Doppler and duplex ultrasound were performed to evaluate the lower extremity deep venous systems from the level of the common femoral vein and including the common femoral, femoral, profunda femoral, popliteal and calf veins including the posterior tibial, peroneal and gastrocnemius veins when visible. The superficial great saphenous vein was also interrogated. Spectral Doppler was utilized to evaluate flow at rest and with distal augmentation maneuvers in the common femoral, femoral and popliteal veins. COMPARISON:  04/05/2010 FINDINGS: Contralateral Common Femoral Vein: Respiratory phasicity is normal and symmetric with the symptomatic side. No evidence of thrombus. Normal compressibility. Common Femoral Vein: No evidence of thrombus. Normal compressibility, respiratory phasicity and response to augmentation. Saphenofemoral Junction: No evidence of thrombus. Normal compressibility and  flow on color Doppler imaging. Profunda Femoral Vein: No evidence of thrombus. Normal compressibility and flow on color Doppler imaging. Femoral Vein: No evidence of thrombus. Normal compressibility, respiratory phasicity and response to augmentation. Popliteal Vein: No evidence of thrombus. Normal compressibility, respiratory phasicity and response to augmentation. Calf Veins: No evidence of thrombus. Normal compressibility and flow on color Doppler imaging. Superficial Great Saphenous Vein: No evidence of thrombus. Normal compressibility and flow on color Doppler imaging. Venous Reflux:  None. Other Findings: Within the area of redness and swelling in the right anterior upper calf, there is a subcutaneous edema with large pockets of fluid seen in the subcutaneous tissue and along the fascial planes. IMPRESSION: No evidence of deep venous thrombosis. Right anterior calf subcutaneous edema with large pockets of fluid in the subcutaneous tissue and along the fascial planes. This may represent posttraumatic changes or, in the absence of history of trauma, may be seen with cellulitis/fasciitis. Electronically Signed   By: Ted Mcalpine M.D.   On: 07/13/2015 07:36   I have personally reviewed and evaluated these images and lab results as part of my medical decision-making.  EKG Interpretation None      MDM   Final diagnoses:  Swelling   TENASIA AULL is a 40 y.o. female here with R anterior tibial swelling and erythema. Consider abscess vs cellulitis vs lymphedema. Will get DVT study, superficial [place].   9:53 AM US showed no DVT but there is subcutaneous edema with large pockets of fluid. WBC 14. I and D performed and large amount serosanguinous drainage came out and no purulent discharge came out. I think likely dependent lymphedema. I applied compression dressing. Told her to wear compression stockings and keep leg elevated. Discussed empiric abx vs 2 day follow up in the ED and she rather hold  abx and come for wound check in 2 days. Stable for dc.     Richardean Canal, MD 07/13/15 [phone]

## 2015-07-13 NOTE — ED Notes (Signed)
Pt arrived to the ED accompanied by her daughter for complaints of right leg selling. Pt is AOx4 in no apparent distress with noticeable right leg swelling and redness.

## 2015-07-13 NOTE — ED Notes (Signed)
Pt returned from X-ray.  

## 2015-07-14 ENCOUNTER — Encounter: Payer: Self-pay | Admitting: Emergency Medicine

## 2015-07-14 ENCOUNTER — Emergency Department
Admission: EM | Admit: 2015-07-14 | Discharge: 2015-07-14 | Disposition: A | Discharge status: Home / Self Care | Payer: Self-pay | Attending: Emergency Medicine | Admitting: Emergency Medicine

## 2015-07-14 DIAGNOSIS — Z7982 Long term (current) use of aspirin: Secondary | ICD-10-CM | POA: Insufficient documentation

## 2015-07-14 DIAGNOSIS — L03115 Cellulitis of right lower limb: Secondary | ICD-10-CM | POA: Insufficient documentation

## 2015-07-14 DIAGNOSIS — I890 Lymphedema, not elsewhere classified: Secondary | ICD-10-CM | POA: Insufficient documentation

## 2015-07-14 MED ORDER — CEPHALEXIN 500 MG PO CAPS: 500.0000 mg | ORAL_CAPSULE | Freq: Three times a day (TID) | ORAL | Status: DC

## 2015-07-14 NOTE — ED Provider Notes (Signed)
[facility] Emergency Department Provider Note  ____________________________________________  Time seen: Approximately 11:08 AM  I have reviewed the triage vital signs and the nursing notes.   HISTORY  Chief Complaint Wound Check    HPI Barbara Hawkins is a 40 y.o. female , NAD, presents to the emergency department for follow-up of an incision and drainage that occurred yesterday. Patient was seen in this emergency department yesterday and reports to right leg swelling and pain that she's had for approximately 5 weeks. DVT study was completed and was negative for DVT but increased fluid was noted about the lower leg. It was deemed that the patient had lymphedema and she would stay in compression wrapping and elevate the leg to decrease the swelling. Patient notes that today there has been redness about the lower leg and increasing pain around the incision and drainage site. Denies any abdominal pain, nausea, vomiting, fever, chills, body aches, chest pain, shortness breath or difficulty breathing. Has been following directives in regards to no weightbearing and keeping the leg elevated since being discharged from this emergency department yesterday.   Past Medical History  Diagnosis Date  . Diverticulitis   . Ehlers-Danlos disease     There are no active problems to display for this patient.   Past Surgical History  Procedure Laterality Date  . Tubal ligation Bilateral   . Foot surgery      Current Outpatient Rx  Name  Route  Sig  Dispense  Refill  . aspirin EC 325 MG tablet   Oral   Take 325 mg by mouth at bedtime as needed for mild pain or moderate pain.         . cephALEXin (KEFLEX) 500 MG capsule   Oral   Take 1 capsule (500 mg total) by mouth 3 (three) times daily.   21 capsule   0   . ibuprofen (ADVIL,MOTRIN) 200 MG tablet   Oral   Take 400 mg by mouth every 6 (six) hours as needed for mild pain or moderate pain.         Marland Kitchen. omeprazole  (PRILOSEC) 20 MG capsule   Oral   Take 20 mg by mouth 2 (two) times daily.            Allergies Morphine and related and Septra  No family history on file.  Social History Social History  Substance Use Topics  . Smoking status: Never Smoker   . Smokeless tobacco: None  . Alcohol Use: Yes     Review of Systems  Constitutional: No fever/chills, fatigue Eyes: No visual changes.  Cardiovascular: No chest pain. Respiratory: No cough. No shortness of breath. No wheezing.  Gastrointestinal: No abdominal pain.  No nausea, vomiting.   Musculoskeletal: Positive right lower leg pain. Negative for back pain.  Skin: Postive redness right lower leg.  Negative for rash. Neurological: Negative for headaches, focal weakness or numbness. No tingling.  10-point ROS otherwise negative.   ____________________________________________   PHYSICAL EXAM:  VITAL SIGNS: ED Triage Vitals  Enc Vitals Group     BP 07/14/15 1051 125/90 mmHg     Pulse Rate 07/14/15 1051 89     Resp --      Temp 07/14/15 1051 98.5 F (36.9 C)     Temp Source 07/14/15 1051 Oral     SpO2 07/14/15 1051 95 %     Weight 07/14/15 1051 320 lb (145.151 kg)     Height 07/14/15 1051 5\' 6"  (1.676 m)  Head Cir --      Peak Flow --      Pain Score 07/14/15 1052 7     Pain Loc --      Pain Edu? --      Excl. in GC? --      Constitutional: Alert and oriented. Well appearing and in no acute distress. Obese.  Eyes: Conjunctivae are normal.  Head: Atraumatic. Cardiovascular:  Good peripheral circulation with 2+ pulses noted in the right lower extremity. Respiratory: Normal respiratory effort without tachypnea or retractions.  Musculoskeletal: Mild tenderness to palpation medial to the incision site noted about the superior, anterior lower leg. No active oozing or weeping is noted. Good granulation tissue noted underneath the incision site. Trace surrounding erythema is noted about the anterior right lower leg.  Compartments are soft. No lower extremity tenderness nor edema.  No joint effusions. Neurologic:  Normal speech and language. No gross focal neurologic deficits are appreciated.  Skin:  Skin is warm, dry and intact. No rash noted. Psychiatric: Mood and affect are normal. Speech and behavior are normal. Patient exhibits appropriate insight and judgement.   ____________________________________________   LABS  None ____________________________________________  EKG  None ____________________________________________  RADIOLOGY  None ____________________________________________    PROCEDURES  Procedure(s) performed: None      Medications - No data to display   ____________________________________________   INITIAL IMPRESSION / ASSESSMENT AND PLAN / ED COURSE  Pertinent labs & imaging results that were available during my care of the patient were reviewed by me and considered in my medical decision making (see chart for details).  Labs, imaging reports as well as provider notes were reviewed from the patient's ER visit 07/13/2015.  Patient's diagnosis is consistent with lymphedema and cellulitis of right lower extremity. Patient will be discharged home with prescriptions for Keflex to take as directed. Patient is to continue to follow the guidelines as discussed from her visit yesterday to include limited weightbearing about the right lower extremity as well as keeping the right lower extremity elevated to decrease swelling. Patient is to follow up with one of the local outpatient community clinics in 48 hours for wound recheck. If she is unable to gain an appointment for recheck she may return to this emergency department. Patient is given ED precautions to return to the ED for any worsening or new symptoms.    ____________________________________________  FINAL CLINICAL IMPRESSION(S) / ED DIAGNOSES  Final diagnoses:  Lymphedema  Cellulitis of right lower extremity       NEW MEDICATIONS STARTED DURING THIS VISIT:  New Prescriptions   CEPHALEXIN (KEFLEX) 500 MG CAPSULE    Take 1 capsule (500 mg total) by mouth 3 (three) times daily.         Hope Pigeon, PA-C 07/14/15 1117

## 2015-07-14 NOTE — Discharge Instructions (Signed)
Lymphedema Lymphedema is swelling that is caused by the abnormal collection of lymph under the skin. Lymph is fluid from the tissues in your body that travels in the lymphatic system. This system is part of the immune system and includes lymph nodes and lymph vessels. The lymph vessels collect and carry the excess fluid, fats, proteins, and wastes from the tissues of the body to the bloodstream. This system also works to clean and remove bacteria and waste products from the body. Lymphedema occurs when the lymphatic system is blocked. When the lymph vessels or lymph nodes are blocked or damaged, lymph does not drain properly, causing an abnormal buildup of lymph. This leads to swelling in the arms or legs. Lymphedema cannot be cured by medicines, but various methods can be used to help reduce the swelling. CAUSES There are two types of lymphedema. Primary lymphedema is caused by the absence or abnormality of the lymph vessel at birth. Secondary lymphedema is more common. It occurs when the lymph vessel is damaged or blocked. Common causes of lymph vessel blockage include:  Skin infection, such as cellulitis.  Infection by parasites (filariasis).  Injury.  Cancer.  Radiation therapy.  Formation of scar tissue.  Surgery. SYMPTOMS Symptoms of this condition include:  Swelling of the arm or leg.  A heavy or tight feeling in the arm or leg.  Swelling of the feet, toes, or fingers. Shoes or rings may fit more tightly than before.  Redness of the skin over the affected area.  Limited movement of the affected limb.  Sensitivity to touch or discomfort in the affected limb. DIAGNOSIS This condition may be diagnosed with:  A physical exam.  Medical history.  Imaging tests, such as:  Lymphoscintigraphy. In this test, a low dose of a radioactive substance is injected to trace the flow of lymph through the lymph vessels.  MRI.  CT scan.  Duplex ultrasound. This test uses sound waves  to produce images of the vessels and the blood flow on a screen.  Lymphangiography. In this test, a contrast dye is injected into the lymph vessel to help show blockages. TREATMENT Treatment for this condition may depend on the cause. Treatment may include:  Exercise. Certain exercises can help fluid move out of the affected limb.  Massage. Gentle massage of the affected limb can help move the fluid out of the area.  Compression. Various methods may be used to apply pressure to the affected limb in order to reduce the swelling.  Wearing compression stockings or sleeves on the affected limb.  Bandaging the affected limb.  Using an external pump that is attached to a sleeve that alternates between applying pressure and releasing pressure.  Surgery. This is usually only done for severe cases. For example, surgery may be done if you have trouble moving the limb or if the swelling does not get better with other treatments. If an underlying condition is causing the lymphedema, treatment for that condition is needed. For example, antibiotic medicines may be used to treat an infection. HOME CARE INSTRUCTIONS Activities  Exercise regularly as directed by your health care provider.  Do not sit with your legs crossed.  When possible, keep the affected limb raised (elevated) above the level of your heart.  Avoid carrying things with an arm that is affected by lymphedema.  Remember that the affected area is more likely to become injured or infected.  Take these steps to help prevent infection:  Keep the affected area clean and dry.  Protect  your skin from cuts. For example, you should use gloves while cooking or gardening. Do not walk barefoot. If you shave the affected area, use an Neurosurgeon. General Instructions  Take medicines only as directed by your health care provider.  Eat a healthy diet that includes a lot of fruits and vegetables.  Do not wear tight clothes, shoes, or  jewelry.  Do not use heating pads over the affected area.  Avoid having blood pressure checked on the affected limb.  Keep all follow-up visits as directed by your health care provider. This is important. SEEK MEDICAL CARE IF:  You continue to have swelling in your limb.  You have a fever.  You have a cut that does not heal.  You have redness or pain in the affected area.  You have new swelling in your limb that comes on suddenly.  You develop purplish spots or sores (lesions) on your limb. SEEK IMMEDIATE MEDICAL CARE IF:  You have a skin rash.  You have chills or sweats.  You have shortness of breath.   This information is not intended to replace advice given to you by your health care provider. Make sure you discuss any questions you have with your health care provider.   Document Released: 11/06/2006 Document Revised: 05/26/2014 Document Reviewed: 12/17/2013 Elsevier Interactive Patient Education 2016 [employer]  Cellulitis Cellulitis is an infection of the skin and the tissue under the skin. The infected area is usually red and tender. This happens most often in the arms and lower legs. HOME CARE   Take your antibiotic medicine as told. Finish the medicine even if you start to feel better.  Keep the infected arm or leg raised (elevated).  Put a warm cloth on the area up to 4 times per day.  Only take medicines as told by your doctor.  Keep all doctor visits as told. GET HELP IF:  You see red streaks on the skin coming from the infected area.  Your red area gets bigger or turns a dark color.  Your bone or joint under the infected area is painful after the skin heals.  Your infection comes back in the same area or different area.  You have a puffy (swollen) bump in the infected area.  You have new symptoms.  You have a fever. GET HELP RIGHT AWAY IF:   You feel very sleepy.  You throw up (vomit) or have watery poop (diarrhea).  You feel sick and  have muscle aches and pains.   This information is not intended to replace advice given to you by your health care provider. Make sure you discuss any questions you have with your health care provider.   Document Released: 06/28/2007 Document Revised: 09/30/2014 Document Reviewed: 03/27/2011 Elsevier Interactive Patient Education [employer]

## 2015-07-14 NOTE — ED Notes (Signed)
Patient presents to the ED with right leg redness and pain since Monday.  Patient was seen in ED yesterday and states redness has increased since then.  Patient was not started on antibiotics yesterday.  Patient states, "the doctor said he was thinking about putting me on antibiotics but decided not to, I think maybe I need them."  Patient is in no obvious distress at this time.  Leg is wrapped in an ace bandage.  Patient has had swelling and pain to her right leg since falling about 1 month ago.

## 2015-07-21 ENCOUNTER — Emergency Department
Admission: EM | Admit: 2015-07-21 | Discharge: 2015-07-21 | Disposition: A | Discharge status: Home / Self Care | Payer: Self-pay | Attending: Emergency Medicine | Admitting: Emergency Medicine

## 2015-07-21 ENCOUNTER — Encounter: Payer: Self-pay | Admitting: Emergency Medicine

## 2015-07-21 DIAGNOSIS — Z7982 Long term (current) use of aspirin: Secondary | ICD-10-CM | POA: Insufficient documentation

## 2015-07-21 DIAGNOSIS — L03115 Cellulitis of right lower limb: Secondary | ICD-10-CM | POA: Insufficient documentation

## 2015-07-21 DIAGNOSIS — R600 Localized edema: Secondary | ICD-10-CM

## 2015-07-21 DIAGNOSIS — I890 Lymphedema, not elsewhere classified: Secondary | ICD-10-CM | POA: Insufficient documentation

## 2015-07-21 MED ORDER — DOXYCYCLINE HYCLATE 50 MG PO CAPS: 100.0000 mg | ORAL_CAPSULE | Freq: Two times a day (BID) | ORAL | Status: DC

## 2015-07-21 MED ORDER — OXYCODONE-ACETAMINOPHEN 5-325 MG PO TABS
1.0000 | ORAL_TABLET | ORAL | Status: DC | PRN
Start: 2015-07-21 — End: 2015-09-28

## 2015-07-21 MED ORDER — IBUPROFEN 800 MG PO TABS: 800.0000 mg | ORAL_TABLET | Freq: Three times a day (TID) | ORAL | Status: DC | PRN

## 2015-07-21 MED ORDER — OXYCODONE-ACETAMINOPHEN 5-325 MG PO TABS
1.0000 | ORAL_TABLET | Freq: Once | ORAL | Status: AC
  Administered 2015-07-21: 1 via ORAL
  Filled 2015-07-21: qty 1

## 2015-07-21 MED ORDER — DOXYCYCLINE HYCLATE 100 MG PO TABS
100.0000 mg | ORAL_TABLET | Freq: Once | ORAL | Status: AC
  Administered 2015-07-21: 100 mg via ORAL
  Filled 2015-07-21: qty 1

## 2015-07-21 NOTE — ED Notes (Signed)
Pt in with co abscess to right lower leg. Was here last week for the same and had it drained and put on antibiotics. States area of redness has decreased but feels like it needs to be drained again due to increased swelling.,

## 2015-07-21 NOTE — ED Provider Notes (Signed)
[facility] Emergency Department Provider Note   ____________________________________________  Time seen: Approximately 5:46 AM  I have reviewed the triage vital signs and the nursing notes.   HISTORY  Chief Complaint Abscess    HPI Barbara Hawkins is a 40 y.o. female who returns to the ED from home with a chief complain of pain, redness and swelling to her right lower leg. Patient fell 6 weeks ago, striking her right leg and left thigh. In that time she has had several ED visits for pain, redness and swelling about her right lower leg.On her 6/20 visit she had an ultrasound demonstrating pockets of subcutaneous edema. On that visit she had incision and drainage performed with serosanguineous drainage but no purulent discharge. 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 Patient returned for wound check the following day and was placed on Keflex for cellulitis. She is finishing her course of Keflex today and feels that the fluid has reaccumulated along with the redness. She could not find compression stockings so has not been compressing the area. Denies associated fever, chills, chest pain, shortness of breath, abdominal pain, nausea, vomiting, or diarrhea. Nothing makes her symptoms better or worse.   Past Medical History  Diagnosis Date  . Diverticulitis   . Ehlers-Danlos disease     There are no active problems to display for this patient.   Past Surgical History  Procedure Laterality Date  . Tubal ligation Bilateral   . Foot surgery      Current Outpatient Rx  Name  Route  Sig  Dispense  Refill  . aspirin EC 325 MG tablet   Oral   Take 325 mg by mouth at bedtime as needed for mild pain or moderate pain.         . cephALEXin (KEFLEX) 500 MG capsule   Oral   Take 1 capsule (500 mg total) by mouth 3 (three) times daily.   21 capsule   0   . ibuprofen (ADVIL,MOTRIN) 200 MG  tablet   Oral   Take 400 mg by mouth every 6 (six) hours as needed for mild pain or moderate pain.         Marland Kitchen. omeprazole (PRILOSEC) 20 MG capsule   Oral   Take 20 mg by mouth 2 (two) times daily.            Allergies Morphine and related and Septra  History reviewed. No pertinent family history.  Social History Social History  Substance Use Topics  . Smoking status: Never Smoker   . Smokeless tobacco: None  . Alcohol Use: Yes    Review of Systems  Constitutional: No fever/chills. Eyes: No visual changes. ENT: No sore throat. Cardiovascular: Denies chest pain. Respiratory: Denies shortness of breath. Gastrointestinal: No abdominal pain.  No nausea, no vomiting.  No diarrhea.  No constipation. Genitourinary: Negative for dysuria. Musculoskeletal: Positive for right leg pain and swelling. Negative for back pain. Skin: Negative for rash. Neurological: Negative for headaches, focal weakness or numbness.  10-point ROS otherwise negative.  ____________________________________________   PHYSICAL EXAM:  VITAL SIGNS: ED Triage Vitals  Enc Vitals Group     BP 07/21/15 0153 124/81 mmHg     Pulse Rate 07/21/15 0153 89     Resp 07/21/15 0153 18     Temp 07/21/15 0153 98.3 F (36.8 C)     Temp Source 07/21/15 0153 Oral     SpO2 07/21/15 0153  98 %     Weight 07/21/15 0153 319 lb (144.697 kg)     Height 07/21/15 0153 5\' 6"  (1.676 m)     Head Cir --      Peak Flow --      Pain Score 07/21/15 0154 4     Pain Loc --      Pain Edu? --      Excl. in GC? --     Constitutional: Alert and oriented. Well appearing and in no acute distress. Eyes: Conjunctivae are normal. PERRL. EOMI. Head: Atraumatic. Nose: No congestion/rhinnorhea. Mouth/Throat: Mucous membranes are moist.  Oropharynx non-erythematous. Neck: No stridor.   Cardiovascular: Normal rate, regular rhythm. Grossly normal heart sounds.  Good peripheral circulation. Respiratory: Normal respiratory effort.  No  retractions. Lungs CTAB. Gastrointestinal: Morbidly obese. Soft and nontender. No distention. No abdominal bruits. No CVA tenderness. Musculoskeletal: Edematous legs bilaterally. Right anterior tibia below knee with palm-sized area of boggy swelling. There is an overlying scab where she had I&D performed previously. Area is mildly red without warmth. Full range of motion knee without pain. Calf is supple without evidence for compartment syndrome. Symmetrically warm limb without evidence for ischemia. 2+ distal pulses. Brisk, less than 5 second capillary refill. Neurologic:  Normal speech and language. No gross focal neurologic deficits are appreciated.  Skin:  Skin is warm, dry and intact. No rash noted. Psychiatric: Mood and affect are normal. Speech and behavior are normal.  ____________________________________________   LABS (all labs ordered are listed, but only abnormal results are displayed)  Labs Reviewed - No data to display ____________________________________________  EKG  None ____________________________________________  RADIOLOGY  None ____________________________________________   PROCEDURES  Procedure(s) performed: None  Critical Care performed: No  ____________________________________________   INITIAL IMPRESSION / ASSESSMENT AND PLAN / ED COURSE  Pertinent labs & imaging results that were available during my care of the patient were reviewed by me and considered in my medical decision making (see chart for details).  40 year old female who returns for increased swelling, pain and redness to right leg. I have reviewed the patient's prior visits. Feel the reaccumulation of fluid is secondary to subcutaneous edema and I have low suspicion for abscess. I did offer to aspirate the fluid but discussed with patient that I anticipate it would re-collect. She decided to hold off on aspiration. Will place patient on doxycycline for MRSA coverage and apply a compressive  dressing. Will refer patient to wound care clinic to evaluate lymphedema as patient also has pockets of fluid to her left thigh. Will also refer patient to general surgery for evaluation of potential fluid evacuation. Strict return precautions given. Patient verbalizes understanding and agrees with plan of care. ____________________________________________   FINAL CLINICAL IMPRESSION(S) / ED DIAGNOSES  Final diagnoses:  Localized edema  Cellulitis of right lower extremity  Lymphedema      NEW MEDICATIONS STARTED DURING THIS VISIT:  New Prescriptions   No medications on file     Note:  This document was prepared using Dragon voice recognition software and may include unintentional dictation errors.    Irean HongJade J Sung, MD 07/21/15 [phone]

## 2015-07-21 NOTE — Discharge Instructions (Signed)
1. Take antibiotic as prescribed (doxycycline 100 mg twice daily 10 days). 2. You may take pain medicines as needed (Motrin/Percocet). 3. You may remove compressive dressing to bathe or sleep. 4. Return to the ER for worsening symptoms, fever, persistent vomiting or other concerns.  Lymphedema Lymphedema is swelling that is caused by the abnormal collection of lymph under the skin. Lymph is fluid from the tissues in your body that travels in the lymphatic system. This system is part of the immune system and includes lymph nodes and lymph vessels. The lymph vessels collect and carry the excess fluid, fats, proteins, and wastes from the tissues of the body to the bloodstream. This system also works to clean and remove bacteria and waste products from the body. Lymphedema occurs when the lymphatic system is blocked. When the lymph vessels or lymph nodes are blocked or damaged, lymph does not drain properly, causing an abnormal buildup of lymph. This leads to swelling in the arms or legs. Lymphedema cannot be cured by medicines, but various methods can be used to help reduce the swelling. CAUSES There are two types of lymphedema. Primary lymphedema is caused by the absence or abnormality of the lymph vessel at birth. Secondary lymphedema is more common. It occurs when the lymph vessel is damaged or blocked. Common causes of lymph vessel blockage include:  Skin infection, such as cellulitis.  Infection by parasites (filariasis).  Injury.  Cancer.  Radiation therapy.  Formation of scar tissue.  Surgery. SYMPTOMS Symptoms of this condition include:  Swelling of the arm or leg.  A heavy or tight feeling in the arm or leg.  Swelling of the feet, toes, or fingers. Shoes or rings may fit more tightly than before.  Redness of the skin over the affected area.  Limited movement of the affected limb.  Sensitivity to touch or discomfort in the affected limb. DIAGNOSIS This condition may be  diagnosed with:  A physical exam.  Medical history.  Imaging tests, such as:  Lymphoscintigraphy. In this test, a low dose of a radioactive substance is injected to trace the flow of lymph through the lymph vessels.  MRI.  CT scan.  Duplex ultrasound. This test uses sound waves to produce images of the vessels and the blood flow on a screen.  Lymphangiography. In this test, a contrast dye is injected into the lymph vessel to help show blockages. TREATMENT Treatment for this condition may depend on the cause. Treatment may include:  Exercise. Certain exercises can help fluid move out of the affected limb.  Massage. Gentle massage of the affected limb can help move the fluid out of the area.  Compression. Various methods may be used to apply pressure to the affected limb in order to reduce the swelling.  Wearing compression stockings or sleeves on the affected limb.  Bandaging the affected limb.  Using an external pump that is attached to a sleeve that alternates between applying pressure and releasing pressure.  Surgery. This is usually only done for severe cases. For example, surgery may be done if you have trouble moving the limb or if the swelling does not get better with other treatments. If an underlying condition is causing the lymphedema, treatment for that condition is needed. For example, antibiotic medicines may be used to treat an infection. HOME CARE INSTRUCTIONS Activities  Exercise regularly as directed by your health care provider.  Do not sit with your legs crossed.  When possible, keep the affected limb raised (elevated) above the level of  your heart.  Avoid carrying things with an arm that is affected by lymphedema.  Remember that the affected area is more likely to become injured or infected.  Take these steps to help prevent infection:  Keep the affected area clean and dry.  Protect your skin from cuts. For example, you should use gloves while  cooking or gardening. Do not walk barefoot. If you shave the affected area, use an Neurosurgeonelectric razor. General Instructions  Take medicines only as directed by your health care provider.  Eat a healthy diet that includes a lot of fruits and vegetables.  Do not wear tight clothes, shoes, or jewelry.  Do not use heating pads over the affected area.  Avoid having blood pressure checked on the affected limb.  Keep all follow-up visits as directed by your health care provider. This is important. SEEK MEDICAL CARE IF:  You continue to have swelling in your limb.  You have a fever.  You have a cut that does not heal.  You have redness or pain in the affected area.  You have new swelling in your limb that comes on suddenly.  You develop purplish spots or sores (lesions) on your limb. SEEK IMMEDIATE MEDICAL CARE IF:  You have a skin rash.  You have chills or sweats.  You have shortness of breath.   This information is not intended to replace advice given to you by your health care provider. Make sure you discuss any questions you have with your health care provider.   Document Released: 11/06/2006 Document Revised: 05/26/2014 Document Reviewed: 12/17/2013 Elsevier Interactive Patient Education 2016 [employer]  Cellulitis Cellulitis is an infection of the skin and the tissue beneath it. The infected area is usually red and tender. Cellulitis occurs most often in the arms and lower legs.  CAUSES  Cellulitis is caused by bacteria that enter the skin through cracks or cuts in the skin. The most common types of bacteria that cause cellulitis are staphylococci and streptococci. SIGNS AND SYMPTOMS   Redness and warmth.  Swelling.  Tenderness or pain.  Fever. DIAGNOSIS  Your health care provider can usually determine what is wrong based on a physical exam. Blood tests may also be done. TREATMENT  Treatment usually involves taking an antibiotic medicine. HOME CARE INSTRUCTIONS     Take your antibiotic medicine as directed by your health care provider. Finish the antibiotic even if you start to feel better.  Keep the infected arm or leg elevated to reduce swelling.  Apply a warm cloth to the affected area up to 4 times per day to relieve pain.  Take medicines only as directed by your health care provider.  Keep all follow-up visits as directed by your health care provider. SEEK MEDICAL CARE IF:   You notice red streaks coming from the infected area.  Your red area gets larger or turns dark in color.  Your bone or joint underneath the infected area becomes painful after the skin has healed.  Your infection returns in the same area or another area.  You notice a swollen bump in the infected area.  You develop new symptoms.  You have a fever. SEEK IMMEDIATE MEDICAL CARE IF:   You feel very sleepy.  You develop vomiting or diarrhea.  You have a general ill feeling (malaise) with muscle aches and pains.   This information is not intended to replace advice given to you by your health care provider. Make sure you discuss any questions you have with your  health care provider.   Document Released: 10/19/2004 Document Revised: 09/30/2014 Document Reviewed: 03/27/2011 Elsevier Interactive Patient Education [employer]

## 2015-08-16 ENCOUNTER — Emergency Department (HOSPITAL_COMMUNITY): Payer: Self-pay

## 2015-08-16 ENCOUNTER — Emergency Department (HOSPITAL_COMMUNITY)
Admission: EM | Admit: 2015-08-16 | Discharge: 2015-08-16 | Disposition: A | Discharge status: Home / Self Care | Payer: Self-pay | Attending: Emergency Medicine | Admitting: Emergency Medicine

## 2015-08-16 ENCOUNTER — Encounter (HOSPITAL_COMMUNITY): Payer: Self-pay

## 2015-08-16 ENCOUNTER — Emergency Department
Admission: EM | Admit: 2015-08-16 | Discharge: 2015-08-16 | Disposition: A | Discharge status: Other Acute Inpatient Hospital | Payer: Self-pay | Attending: Emergency Medicine | Admitting: Emergency Medicine

## 2015-08-16 DIAGNOSIS — S0012XA Contusion of left eyelid and periocular area, initial encounter: Secondary | ICD-10-CM | POA: Insufficient documentation

## 2015-08-16 DIAGNOSIS — Y999 Unspecified external cause status: Secondary | ICD-10-CM | POA: Insufficient documentation

## 2015-08-16 DIAGNOSIS — Y939 Activity, unspecified: Secondary | ICD-10-CM | POA: Insufficient documentation

## 2015-08-16 DIAGNOSIS — Z7982 Long term (current) use of aspirin: Secondary | ICD-10-CM | POA: Insufficient documentation

## 2015-08-16 DIAGNOSIS — S060X9A Concussion with loss of consciousness of unspecified duration, initial encounter: Secondary | ICD-10-CM | POA: Insufficient documentation

## 2015-08-16 DIAGNOSIS — T07XXXA Unspecified multiple injuries, initial encounter: Secondary | ICD-10-CM

## 2015-08-16 DIAGNOSIS — S8012XA Contusion of left lower leg, initial encounter: Secondary | ICD-10-CM | POA: Insufficient documentation

## 2015-08-16 DIAGNOSIS — S8010XA Contusion of unspecified lower leg, initial encounter: Secondary | ICD-10-CM | POA: Insufficient documentation

## 2015-08-16 DIAGNOSIS — Y92410 Unspecified street and highway as the place of occurrence of the external cause: Secondary | ICD-10-CM | POA: Insufficient documentation

## 2015-08-16 DIAGNOSIS — Z791 Long term (current) use of non-steroidal anti-inflammatories (NSAID): Secondary | ICD-10-CM | POA: Insufficient documentation

## 2015-08-16 DIAGNOSIS — S060X1A Concussion with loss of consciousness of 30 minutes or less, initial encounter: Secondary | ICD-10-CM | POA: Insufficient documentation

## 2015-08-16 DIAGNOSIS — S0083XA Contusion of other part of head, initial encounter: Secondary | ICD-10-CM | POA: Insufficient documentation

## 2015-08-16 DIAGNOSIS — R410 Disorientation, unspecified: Secondary | ICD-10-CM | POA: Insufficient documentation

## 2015-08-16 DIAGNOSIS — Z79899 Other long term (current) drug therapy: Secondary | ICD-10-CM | POA: Insufficient documentation

## 2015-08-16 DIAGNOSIS — Y9389 Activity, other specified: Secondary | ICD-10-CM | POA: Insufficient documentation

## 2015-08-16 DIAGNOSIS — R112 Nausea with vomiting, unspecified: Secondary | ICD-10-CM | POA: Insufficient documentation

## 2015-08-16 LAB — I-STAT BETA HCG BLOOD, ED (MC, WL, AP ONLY)

## 2015-08-16 LAB — COMPREHENSIVE METABOLIC PANEL
ALK PHOS: 60 U/L (ref 38–126)
ALT: 13 U/L — ABNORMAL LOW (ref 14–54)
ANION GAP: 9 (ref 5–15)
AST: 37 U/L (ref 15–41)
Albumin: 3.6 g/dL (ref 3.5–5.0)
BILIRUBIN TOTAL: 0.4 mg/dL (ref 0.3–1.2)
BUN: 11 mg/dL (ref 6–20)
CALCIUM: 8.7 mg/dL — AB (ref 8.9–10.3)
CO2: 21 mmol/L — ABNORMAL LOW (ref 22–32)
Chloride: 104 mmol/L (ref 101–111)
Creatinine, Ser: 0.86 mg/dL (ref 0.44–1.00)
GLUCOSE: 121 mg/dL — AB (ref 65–99)
POTASSIUM: 3.5 mmol/L (ref 3.5–5.1)
Sodium: 134 mmol/L — ABNORMAL LOW (ref 135–145)
TOTAL PROTEIN: 6.4 g/dL — AB (ref 6.5–8.1)

## 2015-08-16 LAB — CBC WITH DIFFERENTIAL/PLATELET
BASOS ABS: 0.1 10*3/uL (ref 0–0.1)
BASOS PCT: 0 %
EOS PCT: 0 %
Eosinophils Absolute: 0 10*3/uL (ref 0–0.7)
HEMATOCRIT: 32.2 % — AB (ref 35.0–47.0)
Hemoglobin: 10.7 g/dL — ABNORMAL LOW (ref 12.0–16.0)
LYMPHS PCT: 12 %
Lymphs Abs: 1.6 10*3/uL (ref 1.0–3.6)
MCH: 29.8 pg (ref 26.0–34.0)
MCHC: 33.2 g/dL (ref 32.0–36.0)
MCV: 89.9 fL (ref 80.0–100.0)
MONO ABS: 0.9 10*3/uL (ref 0.2–0.9)
Monocytes Relative: 6 %
NEUTROS ABS: 10.9 10*3/uL — AB (ref 1.4–6.5)
Neutrophils Relative %: 82 %
PLATELETS: 177 10*3/uL (ref 150–440)
RBC: 3.58 MIL/uL — AB (ref 3.80–5.20)
RDW: 14.1 % (ref 11.5–14.5)
WBC: 13.5 10*3/uL — AB (ref 3.6–11.0)

## 2015-08-16 LAB — PROTIME-INR
INR: 1.09
Prothrombin Time: 14.3 seconds (ref 11.4–15.0)

## 2015-08-16 MED ORDER — HYDROCODONE-ACETAMINOPHEN 5-325 MG PO TABS: 1.0000 | ORAL_TABLET | Freq: Four times a day (QID) | ORAL | 0 refills | Status: DC | PRN

## 2015-08-16 MED ORDER — CYCLOBENZAPRINE HCL 10 MG PO TABS: 10.0000 mg | ORAL_TABLET | Freq: Two times a day (BID) | ORAL | 0 refills | Status: DC | PRN
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
## 2015-08-16 NOTE — ED Notes (Signed)
EDP at bedside  
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
## 2015-08-16 NOTE — ED Notes (Signed)
[facility] at bedside 
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
## 2015-08-16 NOTE — ED Notes (Signed)
Report given to Dustin at [facility] 

## 2015-08-16 NOTE — ED Notes (Signed)
Pt. Wheeled to bathroom. Pt. Tolerated well.
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
## 2015-09-10 ENCOUNTER — Emergency Department: Payer: Self-pay

## 2015-09-10 ENCOUNTER — Emergency Department
Admission: EM | Admit: 2015-09-10 | Discharge: 2015-09-10 | Disposition: A | Discharge status: Home / Self Care | Payer: Self-pay | Attending: Emergency Medicine | Admitting: Emergency Medicine

## 2015-09-10 DIAGNOSIS — L539 Erythematous condition, unspecified: Secondary | ICD-10-CM

## 2015-09-10 DIAGNOSIS — L03116 Cellulitis of left lower limb: Secondary | ICD-10-CM | POA: Insufficient documentation

## 2015-09-10 DIAGNOSIS — M79659 Pain in unspecified thigh: Secondary | ICD-10-CM

## 2015-09-10 DIAGNOSIS — Z7982 Long term (current) use of aspirin: Secondary | ICD-10-CM | POA: Insufficient documentation

## 2015-09-10 DIAGNOSIS — L97129 Non-pressure chronic ulcer of left thigh with unspecified severity: Secondary | ICD-10-CM | POA: Insufficient documentation

## 2015-09-10 LAB — COMPREHENSIVE METABOLIC PANEL
ALT: 22 U/L (ref 14–54)
AST: 25 U/L (ref 15–41)
Albumin: 3.3 g/dL — ABNORMAL LOW (ref 3.5–5.0)
Alkaline Phosphatase: 104 U/L (ref 38–126)
Anion gap: 8 (ref 5–15)
BUN: 9 mg/dL (ref 6–20)
CHLORIDE: 105 mmol/L (ref 101–111)
CO2: 25 mmol/L (ref 22–32)
CREATININE: 0.82 mg/dL (ref 0.44–1.00)
Calcium: 8.6 mg/dL — ABNORMAL LOW (ref 8.9–10.3)
GFR calc Af Amer: >60 mL/min (ref >60)
GFR calc non Af Amer: >60 mL/min (ref >60)
Glucose, Bld: 99 mg/dL (ref 65–99)
Potassium: 3.6 mmol/L (ref 3.5–5.1)
SODIUM: 138 mmol/L (ref 135–145)
Total Bilirubin: 0.8 mg/dL (ref 0.3–1.2)
Total Protein: 7.3 g/dL (ref 6.5–8.1)

## 2015-09-10 LAB — CBC
HCT: 30.6 % — ABNORMAL LOW (ref 35.0–47.0)
Hemoglobin: 10.1 g/dL — ABNORMAL LOW (ref 12.0–16.0)
MCH: 28.8 pg (ref 26.0–34.0)
MCHC: 32.9 g/dL (ref 32.0–36.0)
MCV: 87.4 fL (ref 80.0–100.0)
PLATELETS: 326 10*3/uL (ref 150–440)
RBC: 3.5 MIL/uL — AB (ref 3.80–5.20)
RDW: 16 % — ABNORMAL HIGH (ref 11.5–14.5)
WBC: 5.6 10*3/uL (ref 3.6–11.0)

## 2015-09-10 MED ORDER — CLINDAMYCIN PHOSPHATE 900 MG/50ML IV SOLN
900.0000 mg | Freq: Once | INTRAVENOUS | Status: AC
  Administered 2015-09-10: 900 mg via INTRAVENOUS

## 2015-09-10 MED ORDER — CLINDAMYCIN HCL 300 MG PO CAPS: 300.0000 mg | ORAL_CAPSULE | Freq: Three times a day (TID) | ORAL | 0 refills | Status: AC

## 2015-09-10 MED ORDER — CLINDAMYCIN PHOSPHATE 900 MG/50ML IV SOLN
INTRAVENOUS | Status: AC
  Filled 2015-09-10: qty 50

## 2015-09-10 NOTE — ED Notes (Signed)
Patient transported to Ultrasound 

## 2015-09-10 NOTE — ED Triage Notes (Signed)
Patient ambulatory to triage with steady gait, without difficulty or distress noted; pt reports "sore to back of left thigh" for last couple weeks

## 2015-09-10 NOTE — ED Notes (Signed)
Nurse attempted to start IV twice with no success. Butch, RN attempting IV at this time.

## 2015-09-10 NOTE — ED Notes (Addendum)
Pt wound bed measuring 3 cm proximal, 4 cm distal, 5 cm lateral, 4 cm medial Area of redness surrounding wound measuring 6 cm proximal, 7 cm distal, 7 lateral, 6 medial Necrotic tissue noted on wound.

## 2015-09-10 NOTE — ED Notes (Signed)
"  I had a blister that popped." Pt stating that the area was looking good until yesterday.  Pt stating that she bumped it and bloody/clear liquid came out. Pt stating she used neosporin and bandaging on the site to her left posterior thigh. Area is red, pussy, and deeper per pt and now has a smell.
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
## 2015-09-10 NOTE — ED Notes (Signed)
Non-stick dressing applied to posterior left thigh. Secured with tape.

## 2015-09-13 ENCOUNTER — Encounter: Payer: No Typology Code available for payment source | Attending: Surgery | Admitting: Surgery

## 2015-09-13 DIAGNOSIS — Q796 Ehlers-Danlos syndrome: Secondary | ICD-10-CM | POA: Insufficient documentation

## 2015-09-13 DIAGNOSIS — L97112 Non-pressure chronic ulcer of right thigh with fat layer exposed: Secondary | ICD-10-CM | POA: Diagnosis not present

## 2015-09-13 DIAGNOSIS — S71112A Laceration without foreign body, left thigh, initial encounter: Secondary | ICD-10-CM | POA: Diagnosis present

## 2015-09-13 DIAGNOSIS — S71102A Unspecified open wound, left thigh, initial encounter: Secondary | ICD-10-CM | POA: Diagnosis not present

## 2015-09-13 DIAGNOSIS — Z6841 Body Mass Index (BMI) 40.0 and over, adult: Secondary | ICD-10-CM | POA: Insufficient documentation

## 2015-09-13 DIAGNOSIS — R600 Localized edema: Secondary | ICD-10-CM | POA: Insufficient documentation

## 2015-09-13 NOTE — Progress Notes (Signed)
DEZTINEE, LOHMEYER ([id number]) Visit Report for 09/13/2015 Allergy List Details Patient Name: Barbara Hawkins, Barbara Hawkins. Date of Service: 09/13/2015 2:30 PM Medical Record Number: [id number] Patient Account Number: [id number] Date of Birth/Sex: 1975-10-27 (40 y.o. Female) Treating RN: Carolyne Fiscal, Debi Primary Care Physician: PATIENT, NO Other Clinician: Referring Physician: Treating Physician/Extender: Frann Rider in Treatment: 0 Allergies Active Allergies Septra Reaction: swelling, itching, hives, SOB Severity: Severe morphine Reaction: itching, phebitis Severity: Severe Allergy Notes Electronic Signature(s) Signed: 09/13/2015 4:58:13 PM By: Alric Quan Entered By: Alric Quan on 09/13/2015 15:18:43 Mccauslin, Korianna Hawkins. ([id number]) -------------------------------------------------------------------------------- Arrival Information Details Patient Name: Barbara Spruce Hawkins. Date of Service: 09/13/2015 2:30 PM Medical Record Number: [id number] Patient Account Number: [id number] Date of Birth/Sex: 1975-06-06 (40 y.o. Female) Treating RN: Carolyne Fiscal, Debi Primary Care Physician: PATIENT, NO Other Clinician: Referring Physician: Treating Physician/Extender: Frann Rider in Treatment: 0 Visit Information Patient Arrived: Ambulatory Arrival Time: 14:56 Accompanied By: self Transfer Assistance: None Patient Identification Verified: Yes Secondary Verification Process Yes Completed: Patient Requires Transmission-Based No Precautions: Patient Has Alerts: No Electronic Signature(s) Signed: 09/13/2015 4:58:13 PM By: Alric Quan Entered By: Alric Quan on 09/13/2015 14:56:17 Barbara Hawkins, Barbara Hawkins. ([id number]) -------------------------------------------------------------------------------- Clinic Level of Care Assessment Details Patient Name: Barbara Spruce Hawkins. Date of Service: 09/13/2015 2:30 PM Medical Record Number: [id number] Patient Account Number: [id number] Date of  Birth/Sex: February 01, 1975 (40 y.o. Female) Treating RN: Carolyne Fiscal, Debi Primary Care Physician: PATIENT, NO Other Clinician: Referring Physician: Treating Physician/Extender: Frann Rider in Treatment: 0 Clinic Level of Care Assessment Items TOOL 1 Quantity Score X - Use when EandM and Procedure is performed on INITIAL visit 1 0 ASSESSMENTS - Nursing Assessment / Reassessment X - General Physical Exam (combine w/ comprehensive assessment (listed just 1 20 below) when performed on new pt. evals) X - Comprehensive Assessment (HX, ROS, Risk Assessments, Wounds Hx, etc.) 1 25 ASSESSMENTS - Wound and Skin Assessment / Reassessment _0  - Dermatologic / Skin Assessment (not related to wound area) 0 ASSESSMENTS - Ostomy and/or Continence Assessment and Care _1  - Incontinence Assessment and Management 0 _2  - Ostomy Care Assessment and Management (repouching, etc.) 0 PROCESS - Coordination of Care _3  - Simple Patient / Family Education for ongoing care 0 X - Complex (extensive) Patient / Family Education for ongoing care 1 20 X - Staff obtains Programmer, systems, Records, Test Results / Process Orders 1 10 _4  - Staff telephones HHA, Nursing Homes / Clarify orders / etc 0 _5  - Routine Transfer to another Facility (non-emergent condition) 0 _6  - Routine Hospital Admission (non-emergent condition) 0 X - New Admissions / Biomedical engineer / Ordering NPWT, Apligraf, etc. 1 15 _7  - Emergency Hospital Admission (emergent condition) 0 PROCESS - Special Needs _8  - Pediatric / Minor Patient Management 0 _9  - Isolation Patient Management 0 Barbara Hawkins, Barbara Hawkins. ([id number]) _10  - Hearing / Language / Visual special needs 0 _11  - Assessment of Community assistance (transportation, D/C planning, etc.) 0 _12  - Additional assistance / Altered mentation 0 _13  - Support Surface(s) Assessment (bed, cushion, seat, etc.) 0 INTERVENTIONS - Miscellaneous _14  - External ear exam 0 _15  - Patient Transfer (multiple staff /  Civil Service fast streamer / Similar devices) 0 _16  - Simple Staple / Suture removal (25 or less) 0 _17  - Complex Staple / Suture removal (26 or more) 0 _18  - Hypo/Hyperglycemic Management (do not check if billed separately) 0 _19  - Ankle / Brachial Index (ABI) - do not check if billed separately 0 Has the patient been seen at the hospital within the  last three years: Yes Total Score: 90 Level Of Care: New/Established - Level 3 Electronic Signature(s) Signed: 09/13/2015 4:58:13 PM By: Alric Quan Entered By: Alric Quan on 09/13/2015 16:22:58 Barbara Hawkins, Barbara Hawkins. ([id number]) -------------------------------------------------------------------------------- Lower Extremity Assessment Details Patient Name: Barbara Hawkins, Barbara Hawkins. Date of Service: 09/13/2015 2:30 PM Medical Record Number: [id number] Patient Account Number: [id number] Date of Birth/Sex: 22-Jun-1975 (40 y.o. Female) Treating RN: Carolyne Fiscal, Debi Primary Care Physician: PATIENT, NO Other Clinician: Referring Physician: Treating Physician/Extender: Frann Rider in Treatment: 0 Vascular Assessment Pulses: Posterior Tibial Dorsalis Pedis Palpable: [Left:Yes] Extremity colors, hair growth, and conditions: Extremity Color: [Left:Mottled] Temperature of Extremity: [Left:Warm] Capillary Refill: [Left:< 3 seconds] Toe Nail Assessment Left: Right: Thick: No Discolored: No Deformed: No Improper Length and Hygiene: No Electronic Signature(s) Signed: 09/13/2015 4:58:13 PM By: Alric Quan Entered By: Alric Quan on 09/13/2015 15:18:38 Barbara Hawkins, Barbara Hawkins. ([id number]) -------------------------------------------------------------------------------- Multi Wound Chart Details Patient Name: Barbara Spruce Hawkins. Date of Service: 09/13/2015 2:30 PM Medical Record Number: [id number] Patient Account Number: [id number] Date of Birth/Sex: 14-Jan-1976 (39 y.o. Female) Treating RN: Carolyne Fiscal, Debi Primary Care Physician: PATIENT, NO Other  Clinician: Referring Physician: Treating Physician/Extender: Frann Rider in Treatment: 0 Vital Signs Height(in): 66 Pulse(bpm): 104 Weight(lbs): 303.4 Blood Pressure 123/104 (mmHg): Body Mass Index(BMI): 49 Temperature(F): 97.4 Respiratory Rate 20 (breaths/min): Photos: [1:No Photos] [N/A:N/A] Wound Location: [1:Left Upper Leg - Anterior] [N/A:N/A] Wounding Event: [1:Gradually Appeared] [N/A:N/A] Primary Etiology: [1:To be determined] [N/A:N/A] Date Acquired: [1:08/22/2015] [N/A:N/A] Weeks of Treatment: [1:0] [N/A:N/A] Wound Status: [1:Open] [N/A:N/A] Measurements L x W x D 4x4x2 [N/A:N/A] (cm) Area (cm) : [1:12.566] [N/A:N/A] Volume (cm) : [1:25.133] [N/A:N/A] Classification: [1:Partial Thickness] [N/A:N/A] Exudate Amount: [1:Large] [N/A:N/A] Exudate Type: [1:Purulent] [N/A:N/A] Exudate Color: [1:yellow, brown, green] [N/A:N/A] Wound Margin: [1:Distinct, outline attached] [N/A:N/A] Granulation Amount: [1:None Present (0%)] [N/A:N/A] Necrotic Amount: [1:Large (67-100%)] [N/A:N/A] Necrotic Tissue: [1:Eschar, Adherent Slough] [N/A:N/A] Exposed Structures: [1:Fascia: No Fat: No Tendon: No Muscle: No Joint: No Bone: No Limited to Skin Breakdown] [N/A:N/A] Periwound Skin Texture: Edema: Yes [N/A:N/A] Periwound Skin [1:Moist: Yes] [N/A:N/A] Moisture: Barbara Hawkins, Barbara Hawkins. ([id number]) Periwound Skin Color: No Abnormalities Noted N/A N/A Temperature: No Abnormality N/A N/A Tenderness on Yes N/A N/A Palpation: Wound Preparation: Ulcer Cleansing: N/A N/A Rinsed/Irrigated with Saline Topical Anesthetic Applied: Other: lidocaine 4% Treatment Notes Electronic Signature(s) Signed: 09/13/2015 4:58:13 PM By: Alric Quan Entered By: Alric Quan on 09/13/2015 15:29:38 Barbara Hawkins, Barbara Hawkins ([id number]) -------------------------------------------------------------------------------- [place] Details Patient Name: Barbara Spruce Hawkins. Date of Service:  09/13/2015 2:30 PM Medical Record Number: [id number] Patient Account Number: [id number] Date of Birth/Sex: 03/07/1975 (40 y.o. Female) Treating RN: Carolyne Fiscal, Debi Primary Care Physician: PATIENT, NO Other Clinician: Referring Physician: Treating Physician/Extender: Frann Rider in Treatment: 0 Active Inactive Abuse / Safety / Falls / Self Care Management Nursing Diagnoses: Potential for falls Goals: Patient will remain injury free Date Initiated: 09/13/2015 Goal Status: Active Interventions: Assess fall risk on admission and as needed Notes: Nutrition Nursing Diagnoses: Imbalanced nutrition Goals: Patient/caregiver agrees to and verbalizes understanding of need to use nutritional supplements and/or vitamins as prescribed Date Initiated: 09/13/2015 Goal Status: Active Interventions: Assess patient nutrition upon admission and as needed per policy Notes: Orientation to the Wound Care Program Nursing Diagnoses: Knowledge deficit related to the wound healing center program Goals: Patient/caregiver will verbalize understanding of the Barbara Hawkins, Barbara Crystal. ([id number]) Date Initiated: 09/13/2015 Goal Status: Active Interventions: Provide education on orientation to the wound center Notes: Pain, Acute or Chronic Nursing Diagnoses: Pain, acute or chronic:  actual or potential Potential alteration in comfort, pain Goals: Patient/caregiver will verbalize adequate pain control between visits Date Initiated: 09/13/2015 Goal Status: Active Patient/caregiver will verbalize comfort level met Date Initiated: 09/13/2015 Goal Status: Active Interventions: Assess comfort goal upon admission Notes: Wound/Skin Impairment Nursing Diagnoses: Impaired tissue integrity Goals: Ulcer/skin breakdown will have a volume reduction of 30% by week 4 Date Initiated: 09/13/2015 Goal Status: Active Ulcer/skin breakdown will have a volume reduction of 50% by week  8 Date Initiated: 09/13/2015 Goal Status: Active Ulcer/skin breakdown will have a volume reduction of 80% by week 12 Date Initiated: 09/13/2015 Goal Status: Active Interventions: Assess patient/caregiver ability to obtain necessary supplies Assess ulceration(s) every visit Barbara Hawkins, Barbara Hawkins ([id number]) Notes: Electronic Signature(s) Signed: 09/13/2015 4:58:13 PM By: Alric Quan Entered By: Alric Quan on 09/13/2015 15:29:28 Barbara Hawkins, Barbara Hawkins. ([id number]) -------------------------------------------------------------------------------- Pain Assessment Details Patient Name: Barbara Spruce Hawkins. Date of Service: 09/13/2015 2:30 PM Medical Record Number: [id number] Patient Account Number: [id number] Date of Birth/Sex: 04-09-1975 (40 y.o. Female) Treating RN: Carolyne Fiscal, Debi Primary Care Physician: PATIENT, NO Other Clinician: Referring Physician: Treating Physician/Extender: Frann Rider in Treatment: 0 Active Problems Location of Pain Severity and Description of Pain Patient Has Paino Yes Site Locations Pain Location: Pain in Ulcers With Dressing Change: Yes Duration of the Pain. Constant / Intermittento Constant Rate the pain. Current Pain Level: 3 Barbara Hawkins Pain Level: 7 Least Pain Level: 1 Character of Pain Describe the Pain: Burning, Shooting Pain Management and Medication Current Pain Management: Electronic Signature(s) Signed: 09/13/2015 4:58:13 PM By: Alric Quan Entered By: Alric Quan on 09/13/2015 14:56:48 Barbara Hawkins, Barbara Hawkins. ([id number]) -------------------------------------------------------------------------------- Wound Assessment Details Patient Name: Cwik, Lilley Hawkins. Date of Service: 09/13/2015 2:30 PM Medical Record Number: [id number] Patient Account Number: [id number] Date of Birth/Sex: 1975-01-27 (40 y.o. Female) Treating RN: Carolyne Fiscal, Debi Primary Care Physician: PATIENT, NO Other Clinician: Referring Physician: Treating Physician/Extender:  Frann Rider in Treatment: 0 Wound Status Wound Number: 1 Primary Etiology: To be determined Wound Location: Left Upper Leg - Anterior Wound Status: Open Wounding Event: Gradually Appeared Date Acquired: 08/22/2015 Weeks Of Treatment: 0 Clustered Wound: No Photos Photo Uploaded By: Alric Quan on 09/13/2015 15:52:19 Wound Measurements Length: (cm) 4 % Reduction Width: (cm) 4 % Reduction Depth: (cm) 2 Area: (cm) 12.566 Volume: (cm) 25.133 in Area: in Volume: Wound Description Classification: Partial Thickness Wound Margin: Distinct, outline attached Exudate Amount: Large Exudate Type: Purulent Exudate Color: yellow, brown, green Foul Odor After Cleansing: No Wound Bed Granulation Amount: None Present (0%) Exposed Structure Necrotic Amount: Large (67-100%) Fascia Exposed: No Necrotic Quality: Eschar, Adherent Slough Fat Layer Exposed: No Tendon Exposed: No Strike, Mella Hawkins. ([id number]) Muscle Exposed: No Joint Exposed: No Bone Exposed: No Limited to Skin Breakdown Periwound Skin Texture Texture Color No Abnormalities Noted: No No Abnormalities Noted: No Localized Edema: Yes Temperature / Pain Moisture Temperature: No Abnormality No Abnormalities Noted: No Tenderness on Palpation: Yes Moist: Yes Wound Preparation Ulcer Cleansing: Rinsed/Irrigated with Saline Topical Anesthetic Applied: Other: lidocaine 4%, Electronic Signature(s) Signed: 09/13/2015 4:58:13 PM By: Alric Quan Entered By: Alric Quan on 09/13/2015 15:16:53 Goers, Azarria Hawkins. ([id number]) -------------------------------------------------------------------------------- Vitals Details Patient Name: Barbara Spruce Hawkins. Date of Service: 09/13/2015 2:30 PM Medical Record Number: [id number] Patient Account Number: [id number] Date of Birth/Sex: 15-Jul-1975 (40 y.o. Female) Treating RN: Carolyne Fiscal, Debi Primary Care Physician: PATIENT, NO Other Clinician: Referring Physician: Treating  Physician/Extender: Frann Rider in Treatment: 0 Vital Signs Time Taken: 14:56 Temperature (F): 97.4 Height (in): 66 Pulse (bpm): 104 Source: Stated  Respiratory Rate (breaths/min): 20 Weight (lbs): 303.4 Blood Pressure (mmHg): 123/104 Source: Measured Reference Range: 80 - 120 mg / dl Body Mass Index (BMI): 49 Electronic Signature(s) Signed: 09/13/2015 4:58:13 PM By: Alric Quan Entered By: Alric Quan on 09/13/2015 14:59:41

## 2015-09-13 NOTE — Progress Notes (Signed)
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 S71.102A Unspecified open wound, left thigh, initial encount Modifier: 25 l encounter er Quantity: 1 CPT4 Code: [id number] Ald[place], Kasidy Description: 11042 - WC PHYS SUBQ TISS 20 SQ CM ICD-10 Description Diagnosis E66.01 Morbid (severe) obesity due to excess calories S71.112A Laceration without foreign body, left thigh, initia S71.102A Unspecified open wound, left thigh, initial encount  P. ([id number]) Modifier: l encounter er Quantity: 1 Electronic Signature(s) Signed: 09/13/2015 4:58:13 PM By: Alejandro Mulling Previous Signature: 09/13/2015 4:04:27 PM Version By: Evlyn Kanner MD, FACS Previous Signature: 09/13/2015 4:04:17 PM Version By: Evlyn Kanner MD, FACS Entered By: Alejandro Mulling on 09/13/2015 16:23:09

## 2015-09-13 NOTE — Progress Notes (Signed)
Barbara Hawkins, Barbara P. ([id number]) Visit Report for 09/13/2015 Abuse/Suicide Risk Screen Details Patient Name: Barbara Hawkins, Barbara P. Date of Service: 09/13/2015 2:30 PM Medical Record Number: [id number] Patient Account Number: [id number] Date of Birth/Sex: 01/25/1975 (40 y.o. Female) Treating RN: Ashok CordiaPinkerton, Debi Primary Care Physician: PATIENT, NO Other Clinician: Referring Physician: Treating Physician/Extender: Rudene ReBritto, Errol Weeks in Treatment: 0 Abuse/Suicide Risk Screen Items Answer ABUSE/SUICIDE RISK SCREEN: Has anyone close to you tried to hurt or harm you recentlyo No Do you feel uncomfortable with anyone in your familyo No Has anyone forced you do things that you didnot want to doo No Do you have any thoughts of harming yourselfo No Patient displays signs or symptoms of abuse and/or neglect. No Electronic Signature(s) Signed: 09/13/2015 4:58:13 PM By: Alejandro MullingPinkerton, Debra Entered By: Alejandro MullingPinkerton, Debra on 09/13/2015 15:06:50 Barbara Hawkins, Barbara Barbara P. ([id number]) -------------------------------------------------------------------------------- Activities of Daily Living Details Patient Name: [name], [name] Date of Service: 09/13/2015 2:30 PM Medical Record Number: [id number] Patient Account Number: [id number] Date of Birth/Sex: 01/25/1975 (40 y.o. Female) Treating RN: Ashok CordiaPinkerton, Debi Primary Care Physician: PATIENT, NO Other Clinician: Referring Physician: Treating Physician/Extender: Rudene ReBritto, Errol Weeks in Treatment: 0 Activities of Daily Living Items Answer Activities of Daily Living (Please select one for each item) Drive Automobile Not Able Take Medications Completely Able Use Telephone Completely Able Care for Appearance Completely Able Use Toilet Completely Able Bath / Shower Completely Able Dress Self Completely Able Feed Self Completely Able Walk Completely Able Get In / Out Bed Completely Able Housework Completely Able Prepare Meals Completely Able Handle Money Completely  Able Shop for Self Completely Able Electronic Signature(s) Signed: 09/13/2015 4:58:13 PM By: Alejandro MullingPinkerton, Debra Entered By: Alejandro MullingPinkerton, Debra on 09/13/2015 15:07:17 Barbara Hawkins, Barbara P. ([id number]) -------------------------------------------------------------------------------- Education Assessment Details Patient Name: Barbara Hawkins, Barbara P. Date of Service: 09/13/2015 2:30 PM Medical Record Number: [id number] Patient Account Number: [id number] Date of Birth/Sex: 01/25/1975 (40 y.o. Female) Treating RN: Ashok CordiaPinkerton, Debi Primary Care Physician: PATIENT, NO Other Clinician: Referring Physician: Treating Physician/Extender: Rudene ReBritto, Errol Weeks in Treatment: 0 Primary Learner Assessed: Patient Learning Preferences/Education Level/Primary Language Learning Preference: Explanation, Printed Material Highest Education Level: High School Preferred Language: English Cognitive Barrier Assessment/Beliefs Language Barrier: No Translator Needed: No Memory Deficit: No Emotional Barrier: No Cultural/Religious Beliefs Affecting Medical No Care: Physical Barrier Assessment Impaired Vision: Yes Glasses Impaired Hearing: No Decreased Hand dexterity: No Knowledge/Comprehension Assessment Knowledge Level: High Comprehension Level: High Ability to understand written High instructions: Ability to understand verbal High instructions: Motivation Assessment Anxiety Level: Calm Cooperation: Cooperative Education Importance: Acknowledges Need Interest in Health Problems: Asks Questions Perception: Coherent Willingness to Engage in Self- High Management Activities: Readiness to Engage in Self- High Management Activities: Electronic Signature(s) Barbara Hawkins, Barbara P. ([id number]) Signed: 09/13/2015 4:58:13 PM By: Alejandro MullingPinkerton, Debra Entered By: Alejandro MullingPinkerton, Debra on 09/13/2015 15:07:45 Barbara Hawkins, Barbara Demetrius CharityP. ([id number]) -------------------------------------------------------------------------------- Fall Risk Assessment  Details Patient Name: Barbara Hawkins, Barbara P. Date of Service: 09/13/2015 2:30 PM Medical Record Number: [id number] Patient Account Number: [id number] Date of Birth/Sex: 01/25/1975 (40 y.o. Female) Treating RN: Ashok CordiaPinkerton, Debi Primary Care Physician: PATIENT, NO Other Clinician: Referring Physician: Treating Physician/Extender: Rudene ReBritto, Errol Weeks in Treatment: 0 Fall Risk Assessment Items Have you had 2 or more falls in the last 12 monthso 0 Yes Have you had any fall that resulted in injury in the last 12 monthso 0 No FALL RISK ASSESSMENT: History of falling - immediate or within 3 months 25 Yes Secondary diagnosis 15 Yes Ambulatory aid None/bed rest/wheelchair/nurse 0 No Crutches/cane/walker 0 No Furniture 0 No IV Access/Saline Lock 0 No  Gait/Training Normal/bed rest/immobile 0 No Weak 10 Yes Impaired 0 No Mental Status Oriented to own ability 0 Yes Electronic Signature(s) Signed: 09/13/2015 4:58:13 PM By: Alejandro MullingPinkerton, Debra Entered By: Alejandro MullingPinkerton, Debra on 09/13/2015 15:08:51 Barbara Hawkins, Barbara P. ([id number]) -------------------------------------------------------------------------------- Nutrition Risk Assessment Details Patient Name: Barbara Hawkins, Barbara P. Date of Service: 09/13/2015 2:30 PM Medical Record Number: [id number] Patient Account Number: [id number] Date of Birth/Sex: 07-May-1975 (40 y.o. Female) Treating RN: Ashok CordiaPinkerton, Debi Primary Care Physician: PATIENT, NO Other Clinician: Referring Physician: Treating Physician/Extender: Rudene ReBritto, Errol Weeks in Treatment: 0 Height (in): 66 Weight (lbs): 303.4 Body Mass Index (BMI): 49 Nutrition Risk Assessment Items NUTRITION RISK SCREEN: I have an illness or condition that made me change the kind and/or 0 No amount of food I eat I eat fewer than two meals per day 0 No I eat few fruits and vegetables, or milk products 0 No I have three or more drinks of beer, liquor or wine almost every day 0 No I have tooth or mouth problems that make it  hard for me to eat 0 No I don't always have enough money to buy the food I need 0 No I eat alone most of the time 0 No I take three or more different prescribed or over-the-counter drugs a 1 Yes day Without wanting to, I have lost or gained 10 pounds in the last six 0 No months I am not always physically able to shop, cook and/or feed myself 0 No Nutrition Protocols Good Risk Protocol Moderate Risk Protocol Electronic Signature(s) Signed: 09/13/2015 4:58:13 PM By: Alejandro MullingPinkerton, Debra Entered By: Alejandro MullingPinkerton, Debra on 09/13/2015 15:09:34

## 2015-09-20 ENCOUNTER — Encounter: Payer: No Typology Code available for payment source | Admitting: Surgery

## 2015-09-20 DIAGNOSIS — S71112A Laceration without foreign body, left thigh, initial encounter: Secondary | ICD-10-CM | POA: Diagnosis not present

## 2015-09-20 NOTE — Progress Notes (Signed)
JASHA, HODZIC ([id number]) Visit Report for 09/20/2015 Arrival Information Details Patient Name: Barbara Hawkins, Barbara Hawkins. Date of Service: 09/20/2015 12:45 PM Medical Record Number: [id number] Patient Account Number: [id number] Date of Birth/Sex: 11/14/1975 (40 y.o. Female) Treating RN: Carolyne Fiscal, Debi Primary Care Physician: PATIENT, NO Other Clinician: Referring Physician: Treating Physician/Extender: Frann Rider in Treatment: 1 Visit Information History Since Last Visit All ordered tests and consults were completed: No Patient Arrived: Ambulatory Added or deleted any medications: No Arrival Time: 13:00 Any new allergies or adverse reactions: No Accompanied By: daughter Had a fall or experienced change in No Transfer Assistance: None activities of daily living that may affect Patient Identification Verified: Yes risk of falls: Secondary Verification Process Yes Signs or symptoms of abuse/neglect since last No Completed: visito Patient Requires Transmission-Based No Hospitalized since last visit: No Precautions: Pain Present Now: Yes Patient Has Alerts: No Electronic Signature(s) Signed: 09/20/2015 4:32:54 PM By: Alric Quan Entered By: Alric Quan on 09/20/2015 13:03:16 Vandyke, Irlene P. ([id number]) -------------------------------------------------------------------------------- Clinic Level of Care Assessment Details Patient Name: Barbara Spruce P. Date of Service: 09/20/2015 12:45 PM Medical Record Number: [id number] Patient Account Number: [id number] Date of Birth/Sex: Sep 09, 1975 (40 y.o. Female) Treating RN: Carolyne Fiscal, Debi Primary Care Physician: PATIENT, NO Other Clinician: Referring Physician: Treating Physician/Extender: Frann Rider in Treatment: 1 Clinic Level of Care Assessment Items TOOL 4 Quantity Score X - Use when only an EandM is performed on FOLLOW-UP visit 1 0 ASSESSMENTS - Nursing Assessment / Reassessment X - Reassessment of  Co-morbidities (includes updates in patient status) 1 10 X - Reassessment of Adherence to Treatment Plan 1 5 ASSESSMENTS - Wound and Skin Assessment / Reassessment []  - Simple Wound Assessment / Reassessment - one wound 0 X - Complex Wound Assessment / Reassessment - multiple wounds 1 5 []  - Dermatologic / Skin Assessment (not related to wound area) 0 ASSESSMENTS - Focused Assessment []  - Circumferential Edema Measurements - multi extremities 0 []  - Nutritional Assessment / Counseling / Intervention 0 []  - Lower Extremity Assessment (monofilament, tuning fork, pulses) 0 []  - Peripheral Arterial Disease Assessment (using hand held doppler) 0 ASSESSMENTS - Ostomy and/or Continence Assessment and Care []  - Incontinence Assessment and Management 0 []  - Ostomy Care Assessment and Management (repouching, etc.) 0 PROCESS - Coordination of Care []  - Simple Patient / Family Education for ongoing care 0 X - Complex (extensive) Patient / Family Education for ongoing care 1 20 X - Staff obtains Programmer, systems, Records, Test Results / Process Orders 1 10 []  - Staff telephones HHA, Nursing Homes / Clarify orders / etc 0 []  - Routine Transfer to another Facility (non-emergent condition) 0 Folger, Mayumi P. ([id number]) []  - Routine Hospital Admission (non-emergent condition) 0 []  - New Admissions / Biomedical engineer / Ordering NPWT, Apligraf, etc. 0 []  - Emergency Hospital Admission (emergent condition) 0 X - Simple Discharge Coordination 1 10 []  - Complex (extensive) Discharge Coordination 0 PROCESS - Special Needs []  - Pediatric / Minor Patient Management 0 []  - Isolation Patient Management 0 []  - Hearing / Language / Visual special needs 0 []  - Assessment of Community assistance (transportation, D/C planning, etc.) 0 []  - Additional assistance / Altered mentation 0 []  - Support Surface(s) Assessment (bed, cushion, seat, etc.) 0 INTERVENTIONS - Wound Cleansing / Measurement []  - Simple Wound  Cleansing - one wound 0 X - Complex Wound Cleansing - multiple wounds 1 5 X - Wound Imaging (photographs - any number of wounds) 1 5 []  - Wound Tracing (  instead of photographs) 0 []  - Simple Wound Measurement - one wound 0 X - Complex Wound Measurement - multiple wounds 1 5 INTERVENTIONS - Wound Dressings []  - Small Wound Dressing one or multiple wounds 0 X - Medium Wound Dressing one or multiple wounds 1 15 []  - Large Wound Dressing one or multiple wounds 0 X - Application of Medications - topical 1 5 []  - Application of Medications - injection 0 INTERVENTIONS - Miscellaneous []  - External ear exam 0 Chappelle, Yitta P. ([id number]) []  - Specimen Collection (cultures, biopsies, blood, body fluids, etc.) 0 []  - Specimen(s) / Culture(s) sent or taken to Lab for analysis 0 []  - Patient Transfer (multiple staff / Harrel Lemon Lift / Similar devices) 0 []  - Simple Staple / Suture removal (25 or less) 0 []  - Complex Staple / Suture removal (26 or more) 0 []  - Hypo / Hyperglycemic Management (close monitor of Blood Glucose) 0 []  - Ankle / Brachial Index (ABI) - do not check if billed separately 0 X - Vital Signs 1 5 Has the patient been seen at the hospital within the last three years: Yes Total Score: 100 Level Of Care: New/Established - Level 3 Electronic Signature(s) Signed: 09/20/2015 4:32:54 PM By: Alric Quan Entered By: Alric Quan on 09/20/2015 16:11:45 Gipson, Geralda P. ([id number]) -------------------------------------------------------------------------------- Encounter Discharge Information Details Patient Name: Barbara Spruce P. Date of Service: 09/20/2015 12:45 PM Medical Record Number: [id number] Patient Account Number: [id number] Date of Birth/Sex: 1975/05/25 (40 y.o. Female) Treating RN: Carolyne Fiscal, Debi Primary Care Physician: PATIENT, NO Other Clinician: Referring Physician: Treating Physician/Extender: Frann Rider in Treatment: 1 Encounter Discharge Information  Items Discharge Pain Level: 0 Discharge Condition: Stable Ambulatory Status: Ambulatory Discharge Destination: Home Transportation: Private Auto Accompanied By: daughter Schedule Follow-up Appointment: Yes Medication Reconciliation completed and provided to Patient/Care Yes Ashleymarie Granderson: Provided on Clinical Summary of Care: 09/20/2015 Form Type Recipient Paper Patient AW Electronic Signature(s) Signed: 09/20/2015 1:35:31 PM By: Ruthine Dose Entered By: Ruthine Dose on 09/20/2015 13:35:31 Semrad, Adahlia P. ([id number]) -------------------------------------------------------------------------------- Lower Extremity Assessment Details Patient Name: Barbara Spruce P. Date of Service: 09/20/2015 12:45 PM Medical Record Number: [id number] Patient Account Number: [id number] Date of Birth/Sex: 13-Jan-1976 (40 y.o. Female) Treating RN: Ahmed Prima Primary Care Physician: PATIENT, NO Other Clinician: Referring Physician: Treating Physician/Extender: Frann Rider in Treatment: 1 Electronic Signature(s) Signed: 09/20/2015 4:32:54 PM By: Alric Quan Entered By: Alric Quan on 09/20/2015 13:05:25 Gulas, Veola P. ([id number]) -------------------------------------------------------------------------------- Multi Wound Chart Details Patient Name: Barbara Spruce P. Date of Service: 09/20/2015 12:45 PM Medical Record Number: [id number] Patient Account Number: [id number] Date of Birth/Sex: September 28, 1975 (40 y.o. Female) Treating RN: Carolyne Fiscal, Debi Primary Care Physician: PATIENT, NO Other Clinician: Referring Physician: Treating Physician/Extender: Frann Rider in Treatment: 1 Vital Signs Height(in): 66 Pulse(bpm): 93 Weight(lbs): 303.4 Blood Pressure 154/88 (mmHg): Body Mass Index(BMI): 49 Temperature(F): 97.8 Respiratory Rate 20 (breaths/min): Photos: [1:No Photos] [N/A:N/A] Wound Location: [1:Left Upper Leg - Anterior] [N/A:N/A] Wounding Event: [1:Gradually  Appeared] [N/A:N/A] Primary Etiology: [1:To be determined] [N/A:N/A] Date Acquired: [1:08/22/2015] [N/A:N/A] Weeks of Treatment: [1:1] [N/A:N/A] Wound Status: [1:Open] [N/A:N/A] Measurements L x W x D 5.3x6.2x0.7 [N/A:N/A] (cm) Area (cm) : [1:25.808] [N/A:N/A] Volume (cm) : [1:18.066] [N/A:N/A] % Reduction in Area: [1:-105.40%] [N/A:N/A] % Reduction in Volume: 28.10% [N/A:N/A] Classification: [1:Partial Thickness] [N/A:N/A] Exudate Amount: [1:Large] [N/A:N/A] Exudate Type: [1:Serous] [N/A:N/A] Exudate Color: [1:amber] [N/A:N/A] Wound Margin: [1:Distinct, outline attached] [N/A:N/A] Granulation Amount: [1:None Present (0%)] [N/A:N/A] Necrotic Amount: [1:Large (67-100%)] [N/A:N/A] Necrotic Tissue: [1:Eschar, Adherent Slough] [N/A:N/A] Exposed  Structures: [1:Fascia: No Fat: No Tendon: No Muscle: No Joint: No Bone: No Limited to Skin Breakdown] [N/A:N/A] Epithelialization: [1:None] [N/A:N/A] Periwound Skin Texture: Edema: Yes N/A N/A Periwound Skin Moist: Yes N/A N/A Moisture: Periwound Skin Color: No Abnormalities Noted N/A N/A Temperature: No Abnormality N/A N/A Tenderness on Yes N/A N/A Palpation: Wound Preparation: Ulcer Cleansing: N/A N/A Rinsed/Irrigated with Saline Topical Anesthetic Applied: Other: lidocaine 4% Treatment Notes Electronic Signature(s) Signed: 09/20/2015 4:32:54 PM By: Alric Quan Entered By: Alric Quan on 09/20/2015 13:11:27 Placzek, Shanvi Mamie Nick ([id number]) -------------------------------------------------------------------------------- [place] Details Patient Name: Barbara Spruce P. Date of Service: 09/20/2015 12:45 PM Medical Record Number: [id number] Patient Account Number: [id number] Date of Birth/Sex: 1975-10-13 (40 y.o. Female) Treating RN: Carolyne Fiscal, Debi Primary Care Physician: PATIENT, NO Other Clinician: Referring Physician: Treating Physician/Extender: Frann Rider in Treatment: 1 Active  Inactive Abuse / Safety / Falls / Self Care Management Nursing Diagnoses: Potential for falls Goals: Patient will remain injury free Date Initiated: 09/13/2015 Goal Status: Active Interventions: Assess fall risk on admission and as needed Notes: Nutrition Nursing Diagnoses: Imbalanced nutrition Goals: Patient/caregiver agrees to and verbalizes understanding of need to use nutritional supplements and/or vitamins as prescribed Date Initiated: 09/13/2015 Goal Status: Active Interventions: Assess patient nutrition upon admission and as needed per policy Notes: Orientation to the Wound Care Program Nursing Diagnoses: Knowledge deficit related to the wound healing center program Goals: Patient/caregiver will verbalize understanding of the [place], [place]. ([id number]) Date Initiated: 09/13/2015 Goal Status: Active Interventions: Provide education on orientation to the wound center Notes: Pain, Acute or Chronic Nursing Diagnoses: Pain, acute or chronic: actual or potential Potential alteration in comfort, pain Goals: Patient/caregiver will verbalize adequate pain control between visits Date Initiated: 09/13/2015 Goal Status: Active Patient/caregiver will verbalize comfort level met Date Initiated: 09/13/2015 Goal Status: Active Interventions: Assess comfort goal upon admission Notes: Wound/Skin Impairment Nursing Diagnoses: Impaired tissue integrity Goals: Ulcer/skin breakdown will have a volume reduction of 30% by week 4 Date Initiated: 09/13/2015 Goal Status: Active Ulcer/skin breakdown will have a volume reduction of 50% by week 8 Date Initiated: 09/13/2015 Goal Status: Active Ulcer/skin breakdown will have a volume reduction of 80% by week 12 Date Initiated: 09/13/2015 Goal Status: Active Interventions: Assess patient/caregiver ability to obtain necessary supplies Assess ulceration(s) every visit ELIORA, NIENHUIS  ([id number]) Notes: Electronic Signature(s) Signed: 09/20/2015 4:32:54 PM By: Alric Quan Entered By: Alric Quan on 09/20/2015 13:11:22 Omara, Faviola P. ([id number]) -------------------------------------------------------------------------------- Pain Assessment Details Patient Name: Barbara Spruce P. Date of Service: 09/20/2015 12:45 PM Medical Record Number: [id number] Patient Account Number: [id number] Date of Birth/Sex: [birth year] (40 y.o. Female) Treating RN: Carolyne Fiscal, Debi Primary Care Physician: PATIENT, NO Other Clinician: Referring Physician: Treating Physician/Extender: Frann Rider in Treatment: 1 Active Problems Location of Pain Severity and Description of Pain Patient Has Paino Yes Site Locations Pain Location: Pain in Ulcers With Dressing Change: Yes Duration of the Pain. Constant / Intermittento Constant Rate the pain. Current Pain Level: 4 Worst Pain Level: 10 Least Pain Level: 2 Character of Pain Describe the Pain: Burning, Tender Pain Management and Medication Current Pain Management: Electronic Signature(s) Signed: 09/20/2015 4:32:54 PM By: Alric Quan Entered By: Alric Quan on 09/20/2015 13:04:41 Brunette, Magdeline P. ([id number]) -------------------------------------------------------------------------------- Patient/Caregiver Education Details Patient Name: Barbara Spruce P. Date of Service: 09/20/2015 12:45 PM Medical Record Number: [id number] Patient Account Number: [id number] Date of Birth/Gender: 04/01/1975 (40 y.o. Female) Treating RN: Carolyne Fiscal, Debi Primary Care Physician: PATIENT, NO Other Clinician: Referring Physician: Treating  Physician/Extender: Frann Rider in Treatment: 1 Education Assessment Education Provided To: Patient Education Topics Provided Wound/Skin Impairment: Handouts: Other: change dressing as ordered Methods: Demonstration, Explain/Verbal Responses: State content correctly Electronic  Signature(s) Signed: 09/20/2015 4:32:54 PM By: Alric Quan Entered By: Alric Quan on 09/20/2015 13:20:05 Meetze, Rickie P. ([id number]) -------------------------------------------------------------------------------- Wound Assessment Details Patient Name: Northway, Lanaysia P. Date of Service: 09/20/2015 12:45 PM Medical Record Number: [id number] Patient Account Number: [id number] Date of Birth/Sex: [birth year] (40 y.o. Female) Treating RN: Carolyne Fiscal, Debi Primary Care Physician: PATIENT, NO Other Clinician: Referring Physician: Treating Physician/Extender: Frann Rider in Treatment: 1 Wound Status Wound Number: 1 Primary Etiology: To be determined Wound Location: Left Upper Leg - Anterior Wound Status: Open Wounding Event: Gradually Appeared Date Acquired: 08/22/2015 Weeks Of Treatment: 1 Clustered Wound: No Photos Photo Uploaded By: Alric Quan on 09/20/2015 15:41:13 Wound Measurements Length: (cm) 5.3 Width: (cm) 6.2 Depth: (cm) 0.7 Area: (cm) 25.808 Volume: (cm) 18.066 % Reduction in Area: -105.4% % Reduction in Volume: 28.1% Epithelialization: None Tunneling: No Undermining: No Wound Description Classification: Partial Thickness Wound Margin: Distinct, outline attached Exudate Amount: Large Exudate Type: Serous Exudate Color: amber Foul Odor After Cleansing: No Wound Bed Granulation Amount: None Present (0%) Exposed Structure Necrotic Amount: Large (67-100%) Fascia Exposed: No Necrotic Quality: Eschar, Adherent Slough Fat Layer Exposed: No Tendon Exposed: No Apperson, Gricel P. ([id number]) Muscle Exposed: No Joint Exposed: No Bone Exposed: No Limited to Skin Breakdown Periwound Skin Texture Texture Color No Abnormalities Noted: No No Abnormalities Noted: No Localized Edema: Yes Temperature / Pain Moisture Temperature: No Abnormality No Abnormalities Noted: No Tenderness on Palpation: Yes Moist: Yes Wound Preparation Ulcer Cleansing:  Rinsed/Irrigated with Saline Topical Anesthetic Applied: Other: lidocaine 4%, Treatment Notes Wound #1 (Left, Anterior Upper Leg) 1. Cleansed with: Clean wound with Normal Saline 2. Anesthetic Topical Lidocaine 4% cream to wound bed prior to debridement 3. Peri-wound Care: Skin Prep 4. Dressing Applied: Aquacel Ag 5. Secondary Dressing Applied Bordered Foam Dressing Electronic Signature(s) Signed: 09/20/2015 4:32:54 PM By: Alric Quan Entered By: Alric Quan on 09/20/2015 13:09:47 Agredano, Marlaine PMarland Kitchen ([id number]) -------------------------------------------------------------------------------- Vitals Details Patient Name: Barbara Spruce P. Date of Service: 09/20/2015 12:45 PM Medical Record Number: [id number] Patient Account Number: [id number] Date of Birth/Sex: 1975-09-04 (40 y.o. Female) Treating RN: Carolyne Fiscal, Debi Primary Care Physician: PATIENT, NO Other Clinician: Referring Physician: Treating Physician/Extender: Frann Rider in Treatment: 1 Vital Signs Time Taken: 13:04 Temperature (F): 97.8 Height (in): 66 Pulse (bpm): 93 Weight (lbs): 303.4 Respiratory Rate (breaths/min): 20 Body Mass Index (BMI): 49 Blood Pressure (mmHg): 154/88 Reference Range: 80 - 120 mg / dl Electronic Signature(s) Signed: 09/20/2015 4:32:54 PM By: Alric Quan Entered By: Alric Quan on 09/20/2015 13:05:19
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
## 2015-09-26 ENCOUNTER — Inpatient Hospital Stay: Payer: Self-pay | Admitting: Anesthesiology

## 2015-09-26 ENCOUNTER — Encounter: Payer: Self-pay | Admitting: Emergency Medicine

## 2015-09-26 ENCOUNTER — Inpatient Hospital Stay
Admission: EM | Admit: 2015-09-26 | Discharge: 2015-09-28 | DRG: 571 | Disposition: A | Discharge status: Home / Self Care | Payer: Self-pay | Attending: General Surgery | Admitting: General Surgery

## 2015-09-26 ENCOUNTER — Emergency Department: Payer: Self-pay

## 2015-09-26 ENCOUNTER — Encounter
Admission: EM | Disposition: A | Discharge status: Home / Self Care | Payer: Self-pay | Source: Home / Self Care | Attending: General Surgery

## 2015-09-26 DIAGNOSIS — I96 Gangrene, not elsewhere classified: Secondary | ICD-10-CM | POA: Diagnosis present

## 2015-09-26 DIAGNOSIS — Q796 Ehlers-Danlos syndrome: Secondary | ICD-10-CM

## 2015-09-26 DIAGNOSIS — L02419 Cutaneous abscess of limb, unspecified: Principal | ICD-10-CM | POA: Diagnosis present

## 2015-09-26 DIAGNOSIS — D649 Anemia, unspecified: Secondary | ICD-10-CM | POA: Diagnosis present

## 2015-09-26 DIAGNOSIS — L0390 Cellulitis, unspecified: Secondary | ICD-10-CM

## 2015-09-26 DIAGNOSIS — T148XXA Other injury of unspecified body region, initial encounter: Secondary | ICD-10-CM

## 2015-09-26 DIAGNOSIS — L03116 Cellulitis of left lower limb: Secondary | ICD-10-CM

## 2015-09-26 DIAGNOSIS — Z9889 Other specified postprocedural states: Secondary | ICD-10-CM

## 2015-09-26 DIAGNOSIS — Z885 Allergy status to narcotic agent status: Secondary | ICD-10-CM

## 2015-09-26 DIAGNOSIS — L0291 Cutaneous abscess, unspecified: Secondary | ICD-10-CM

## 2015-09-26 DIAGNOSIS — Z881 Allergy status to other antibiotic agents status: Secondary | ICD-10-CM

## 2015-09-26 DIAGNOSIS — I10 Essential (primary) hypertension: Secondary | ICD-10-CM | POA: Diagnosis present

## 2015-09-26 DIAGNOSIS — Z823 Family history of stroke: Secondary | ICD-10-CM

## 2015-09-26 DIAGNOSIS — L089 Local infection of the skin and subcutaneous tissue, unspecified: Secondary | ICD-10-CM

## 2015-09-26 DIAGNOSIS — Z9851 Tubal ligation status: Secondary | ICD-10-CM

## 2015-09-26 DIAGNOSIS — Z79899 Other long term (current) drug therapy: Secondary | ICD-10-CM

## 2015-09-26 DIAGNOSIS — S71102A Unspecified open wound, left thigh, initial encounter: Secondary | ICD-10-CM | POA: Diagnosis present

## 2015-09-26 DIAGNOSIS — Z7982 Long term (current) use of aspirin: Secondary | ICD-10-CM

## 2015-09-26 DIAGNOSIS — S71102D Unspecified open wound, left thigh, subsequent encounter: Secondary | ICD-10-CM

## 2015-09-26 HISTORY — PX: APPLICATION OF WOUND VAC: SHX5189

## 2015-09-26 HISTORY — PX: INCISION AND DRAINAGE ABSCESS: SHX5864

## 2015-09-26 LAB — COMPREHENSIVE METABOLIC PANEL
ALT: 12 U/L — ABNORMAL LOW (ref 14–54)
ANION GAP: 7 (ref 5–15)
AST: 17 U/L (ref 15–41)
Albumin: 3.7 g/dL (ref 3.5–5.0)
Alkaline Phosphatase: 76 U/L (ref 38–126)
BILIRUBIN TOTAL: 0.6 mg/dL (ref 0.3–1.2)
BUN: 12 mg/dL (ref 6–20)
CHLORIDE: 107 mmol/L (ref 101–111)
CO2: 22 mmol/L (ref 22–32)
Calcium: 9 mg/dL (ref 8.9–10.3)
Creatinine, Ser: 0.69 mg/dL (ref 0.44–1.00)
Glucose, Bld: 96 mg/dL (ref 65–99)
POTASSIUM: 3.9 mmol/L (ref 3.5–5.1)
Sodium: 136 mmol/L (ref 135–145)
TOTAL PROTEIN: 7.2 g/dL (ref 6.5–8.1)

## 2015-09-26 LAB — CBC WITH DIFFERENTIAL/PLATELET
BASOS ABS: 0.1 10*3/uL (ref 0–0.1)
Basophils Relative: 1 %
Eosinophils Absolute: 0.1 10*3/uL (ref 0–0.7)
Eosinophils Relative: 1 %
HCT: 34.6 % — ABNORMAL LOW (ref 35.0–47.0)
HEMOGLOBIN: 11.5 g/dL — AB (ref 12.0–16.0)
LYMPHS ABS: 1.9 10*3/uL (ref 1.0–3.6)
LYMPHS PCT: 22 %
MCH: 28.4 pg (ref 26.0–34.0)
MCHC: 33.3 g/dL (ref 32.0–36.0)
MCV: 85.4 fL (ref 80.0–100.0)
Monocytes Absolute: 0.6 10*3/uL (ref 0.2–0.9)
Monocytes Relative: 6 %
NEUTROS PCT: 70 %
Neutro Abs: 6.3 10*3/uL (ref 1.4–6.5)
PLATELETS: 269 10*3/uL (ref 150–440)
RBC: 4.05 MIL/uL (ref 3.80–5.20)
RDW: 16.3 % — ABNORMAL HIGH (ref 11.5–14.5)
WBC: 8.9 10*3/uL (ref 3.6–11.0)

## 2015-09-26 LAB — POCT PREGNANCY, URINE: PREG TEST UR: NEGATIVE

## 2015-09-26 SURGERY — INCISION AND DRAINAGE, ABSCESS
Anesthesia: General | Laterality: Left

## 2015-09-26 MED ORDER — PHENYLEPHRINE HCL 10 MG/ML IJ SOLN
INTRAMUSCULAR | Status: DC | PRN
  Administered 2015-09-26 (×7): 100 ug via INTRAVENOUS

## 2015-09-26 MED ORDER — MIDAZOLAM HCL 2 MG/2ML IJ SOLN
INTRAMUSCULAR | Status: DC | PRN
  Administered 2015-09-26: 3 mg via INTRAVENOUS

## 2015-09-26 MED ORDER — FENTANYL CITRATE (PF) 100 MCG/2ML IJ SOLN
INTRAMUSCULAR | Status: AC
  Administered 2015-09-26: 25 ug via INTRAVENOUS
  Filled 2015-09-26: qty 2

## 2015-09-26 MED ORDER — ONDANSETRON HCL 4 MG/2ML IJ SOLN
INTRAMUSCULAR | Status: DC | PRN
  Administered 2015-09-26: 4 mg via INTRAVENOUS

## 2015-09-26 MED ORDER — VANCOMYCIN HCL IN DEXTROSE 1-5 GM/200ML-% IV SOLN
1000.0000 mg | Freq: Once | INTRAVENOUS | Status: AC
  Administered 2015-09-26: 1000 mg via INTRAVENOUS
  Filled 2015-09-26: qty 200

## 2015-09-26 MED ORDER — LABETALOL HCL 5 MG/ML IV SOLN
10.0000 mg | Freq: Once | INTRAVENOUS | Status: AC
  Administered 2015-09-26: 10 mg via INTRAVENOUS
  Filled 2015-09-26: qty 4

## 2015-09-26 MED ORDER — OXYCODONE HCL 5 MG/5ML PO SOLN: 5.0000 mg | Freq: Once | ORAL | Status: DC | PRN

## 2015-09-26 MED ORDER — LACTATED RINGERS IV SOLN
INTRAVENOUS | Status: DC | PRN
  Administered 2015-09-26: 11:00:00 via INTRAVENOUS

## 2015-09-26 MED ORDER — OXYCODONE HCL 5 MG PO TABS
5.0000 mg | ORAL_TABLET | ORAL | Status: DC | PRN
  Administered 2015-09-26 – 2015-09-28 (×9): 5 mg via ORAL
  Filled 2015-09-26 (×9): qty 1

## 2015-09-26 MED ORDER — WHITE PETROLATUM GEL
Status: AC
  Administered 2015-09-26: 13:00:00
  Filled 2015-09-26: qty 5

## 2015-09-26 MED ORDER — ROCURONIUM BROMIDE 100 MG/10ML IV SOLN
INTRAVENOUS | Status: DC | PRN
  Administered 2015-09-26: 40 mg via INTRAVENOUS
  Administered 2015-09-26: 10 mg via INTRAVENOUS

## 2015-09-26 MED ORDER — PANTOPRAZOLE SODIUM 40 MG PO TBEC
40.0000 mg | DELAYED_RELEASE_TABLET | Freq: Every day | ORAL | Status: DC
  Administered 2015-09-26 – 2015-09-28 (×3): 40 mg via ORAL
  Filled 2015-09-26 (×3): qty 1

## 2015-09-26 MED ORDER — LACTATED RINGERS IV SOLN: INTRAVENOUS | Status: DC

## 2015-09-26 MED ORDER — PIPERACILLIN-TAZOBACTAM 3.375 G IVPB 30 MIN: 3.3750 g | Freq: Three times a day (TID) | INTRAVENOUS | Status: DC

## 2015-09-26 MED ORDER — PROMETHAZINE HCL 25 MG/ML IJ SOLN: 6.2500 mg | INTRAMUSCULAR | Status: DC | PRN

## 2015-09-26 MED ORDER — LACTATED RINGERS IV SOLN
INTRAVENOUS | Status: DC
  Administered 2015-09-26: 15:00:00 via INTRAVENOUS

## 2015-09-26 MED ORDER — DEXAMETHASONE SODIUM PHOSPHATE 10 MG/ML IJ SOLN
INTRAMUSCULAR | Status: DC | PRN
  Administered 2015-09-26: 10 mg via INTRAVENOUS

## 2015-09-26 MED ORDER — FENTANYL CITRATE (PF) 100 MCG/2ML IJ SOLN
25.0000 ug | INTRAMUSCULAR | Status: DC | PRN
  Administered 2015-09-26 (×2): 25 ug via INTRAVENOUS

## 2015-09-26 MED ORDER — OXYCODONE HCL 5 MG PO TABS: 5.0000 mg | ORAL_TABLET | Freq: Once | ORAL | Status: DC | PRN

## 2015-09-26 MED ORDER — LIDOCAINE HCL (CARDIAC) 20 MG/ML IV SOLN
INTRAVENOUS | Status: DC | PRN
  Administered 2015-09-26: 100 mg via INTRAVENOUS

## 2015-09-26 MED ORDER — FENTANYL CITRATE (PF) 100 MCG/2ML IJ SOLN
INTRAMUSCULAR | Status: DC | PRN
  Administered 2015-09-26: 2 ug via INTRAVENOUS
  Administered 2015-09-26: 1 ug via INTRAVENOUS

## 2015-09-26 MED ORDER — SUCCINYLCHOLINE CHLORIDE 20 MG/ML IJ SOLN
INTRAMUSCULAR | Status: DC | PRN
  Administered 2015-09-26: 120 mg via INTRAVENOUS

## 2015-09-26 MED ORDER — PROPOFOL 10 MG/ML IV BOLUS
INTRAVENOUS | Status: DC | PRN
  Administered 2015-09-26: 180 mg via INTRAVENOUS

## 2015-09-26 MED ORDER — PIPERACILLIN-TAZOBACTAM 3.375 G IVPB
3.3750 g | Freq: Three times a day (TID) | INTRAVENOUS | Status: DC
  Administered 2015-09-26 – 2015-09-28 (×6): 3.375 g via INTRAVENOUS
  Filled 2015-09-26 (×8): qty 50

## 2015-09-26 MED ORDER — EPHEDRINE SULFATE 50 MG/ML IJ SOLN
INTRAMUSCULAR | Status: DC | PRN
  Administered 2015-09-26 (×2): 10 mg via INTRAVENOUS

## 2015-09-26 MED ORDER — MEPERIDINE HCL 25 MG/ML IJ SOLN: 6.2500 mg | INTRAMUSCULAR | Status: DC | PRN

## 2015-09-26 MED ORDER — IOPAMIDOL (ISOVUE-300) INJECTION 61%
100.0000 mL | Freq: Once | INTRAVENOUS | Status: AC | PRN
  Administered 2015-09-26: 100 mL via INTRAVENOUS

## 2015-09-26 SURGICAL SUPPLY — 35 items
BANDAGE ELASTIC 6 CLIP NS LF (GAUZE/BANDAGES/DRESSINGS) IMPLANT
BLADE SURG 15 STRL SS SAFETY (BLADE) ×2 IMPLANT
BNDG GAUZE 4.5X4.1 6PLY STRL (MISCELLANEOUS) IMPLANT
CANISTER SUCT 1200ML W/VALVE (MISCELLANEOUS) ×2 IMPLANT
CHLORAPREP W/TINT 26ML (MISCELLANEOUS) ×2 IMPLANT
DRAIN PENROSE 1/4X12 LTX (DRAIN) ×2 IMPLANT
DRAPE LAPAROTOMY 100X77 ABD (DRAPES) ×2 IMPLANT
DRESSING TELFA 4X3 1S ST N-ADH (GAUZE/BANDAGES/DRESSINGS) IMPLANT
DRSG TEGADERM 4X4.75 (GAUZE/BANDAGES/DRESSINGS) IMPLANT
ELECT REM PT RETURN 9FT ADLT (ELECTROSURGICAL) ×2
ELECTRODE REM PT RTRN 9FT ADLT (ELECTROSURGICAL) ×1 IMPLANT
GAUZE SPONGE 4X4 12PLY STRL (GAUZE/BANDAGES/DRESSINGS) ×1 IMPLANT
GLOVE BIO SURGEON STRL SZ7 (GLOVE) ×2 IMPLANT
GOWN STRL REUS W/ TWL LRG LVL3 (GOWN DISPOSABLE) ×2 IMPLANT
GOWN STRL REUS W/TWL LRG LVL3 (GOWN DISPOSABLE) ×4
KIT DRSG VAC SLVR GRANUFM (MISCELLANEOUS) ×1 IMPLANT
KIT RM TURNOVER STRD PROC AR (KITS) ×2 IMPLANT
LABEL OR SOLS (LABEL) ×2 IMPLANT
NDL SAFETY 22GX1.5 (NEEDLE) ×2 IMPLANT
NDL SAFETY 25GX1.5 (NEEDLE) ×2 IMPLANT
NS IRRIG 1000ML POUR BTL (IV SOLUTION) ×2 IMPLANT
PACK BASIN MINOR ARMC (MISCELLANEOUS) ×2 IMPLANT
SOL PREP PVP 2OZ (MISCELLANEOUS) ×2
SOLUTION PREP PVP 2OZ (MISCELLANEOUS) ×1 IMPLANT
STRIP CLOSURE SKIN 1/2X4 (GAUZE/BANDAGES/DRESSINGS) IMPLANT
SUT ETHILON 3-0 FS-10 30 BLK (SUTURE)
SUT VIC AB 2-0 CT1 27 (SUTURE)
SUT VIC AB 2-0 CT1 TAPERPNT 27 (SUTURE) IMPLANT
SUT VIC AB 2-0 CT2 27 (SUTURE) IMPLANT
SUT VIC AB 4-0 FS2 27 (SUTURE) IMPLANT
SUTURE EHLN 3-0 FS-10 30 BLK (SUTURE) IMPLANT
SWAB CULTURE AMIES ANAERIB BLU (MISCELLANEOUS) ×2 IMPLANT
SWABSTK COMLB BENZOIN TINCTURE (MISCELLANEOUS) ×2 IMPLANT
SYR CONTROL 10ML (SYRINGE) ×2 IMPLANT
WND VAC CANISTER 500ML (MISCELLANEOUS) ×1 IMPLANT

## 2015-09-26 NOTE — ED Notes (Signed)
Dr. Zenda AlpersWebster in to examine pt.

## 2015-09-26 NOTE — ED Notes (Signed)
Patient daughter- contact information  [name]- [phone]

## 2015-09-26 NOTE — Anesthesia Postprocedure Evaluation (Signed)
Anesthesia Post Note  Patient: Barbara Hawkins  Procedure(s) Performed: Procedure(s) (LRB): INCISION AND DRAINAGE ABSCESS (Left) APPLICATION OF WOUND VAC (Left)  Patient location during evaluation: PACU Anesthesia Type: General Level of consciousness: awake and alert and oriented Pain management: pain level controlled Vital Signs Assessment: post-procedure vital signs reviewed and stable Respiratory status: spontaneous breathing, nonlabored ventilation and respiratory function stable Cardiovascular status: blood pressure returned to baseline and stable Postop Assessment: no signs of nausea or vomiting Anesthetic complications: no    Last Vitals:  Vitals:   09/26/15 0839 09/26/15 0930  BP: (!) 153/90 (!) 149/79  Pulse: 96 94  Resp:    Temp:      Last Pain:  Vitals:   09/26/15 1230  TempSrc:   PainSc: 4                  Korinne Greenstein

## 2015-09-26 NOTE — ED Notes (Signed)
Report to angela, rn. 

## 2015-09-26 NOTE — Brief Op Note (Signed)
09/26/2015  12:51 PM  PATIENT:  Barbara Hawkins  40 y.o. female  PRE-OPERATIVE DIAGNOSIS:  necrotic wound left thigh,fluid pockets both legs  POST-OPERATIVE DIAGNOSIS:  same  PROCEDURE:  Procedure(s): INCISION AND DRAINAGE ABSCESS (Left) APPLICATION OF WOUND VAC (Left)  SURGEON:  Surgeon(s) and Role:    * Seeplaputhur Wynona LunaG Sankar, MD - Primary  PHYSICIAN ASSISTANT:   ASSISTANTS: none   ANESTHESIA:   general  EBL:  Total I/O In: 500 [I.V.:500] Out: 10 [Blood:10]  BLOOD ADMINISTERED:none  DRAINS: wound vac   LOCAL MEDICATIONS USED:  NONE  SPECIMEN:  No Specimen  DISPOSITION OF SPECIMEN:  N/A  COUNTS:  YES  TOURNIQUET:  * No tourniquets in log *  DICTATION: .Dragon Dictation  PLAN OF CARE: Admit to inpatient   PATIENT DISPOSITION:  med surg   Delay start of Pharmacological VTE agent (>24hrs) due to surgical blood loss or risk of bleeding: not applicable

## 2015-09-26 NOTE — ED Provider Notes (Signed)
[facility] Emergency Department Provider Note   ____________________________________________   First MD Initiated Contact with Patient 09/26/15 [phone]     (approximate)  I have reviewed the triage vital signs and the nursing notes.   HISTORY  Chief Complaint Wound Check    HPI Barbara Hawkins is a 40 y.o. female who comes into the hospital today with a wound on the back of her leg. She reports that she is been getting treatment at the wound care center for this wound.The patient reports that her daughter cares for it between the time she go to the wound center. She says it is been looking bigger and more bruised. It also started draining quite a bit today. This wound started as a blister on the back of her left leg and then a scab. She noticed it 3 weeks ago. She was seen here in the emergency department and given antibiotics. The patient was then referred to the wound care center who debridement the area and has been caring for it weekly. The patient reports that it seemed to be getting more red and more bruised on Monday but the wound care center said there was nothing that they could do about it. It seems to have a big difference to it today as is been draining significantly and bleeding. The patient reports that she's had some increased pain with some burning around the edges of the wound. She also has a knot above the wound that hurts. She denies any fevers, nausea, vomiting and rates her pain a 3-4 out of 10 in intensity currently. The patient reports that she had not initially finished her clindamycin but she did resume taking clindamycin for her wound and finished it. The patient is here for evaluation. She was told that she may need surgery but initially did not consent due to her lack of insurance. She reports though that she just wants this taken care of.   Past Medical History:  Diagnosis Date  . Diverticulitis   . Ehlers-Danlos disease     There are  no active problems to display for this patient.   Past Surgical History:  Procedure Laterality Date  . FOOT SURGERY    . TUBAL LIGATION Bilateral     Prior to Admission medications   Medication Sig Start Date End Date Taking? Authorizing Provider  aspirin EC 81 MG tablet Take 81 mg by mouth as needed for mild pain or moderate pain.    Historical Provider, MD  cephALEXin (KEFLEX) 500 MG capsule Take 1 capsule (500 mg total) by mouth 3 (three) times daily. 07/14/15   Jami L Hagler, PA-C  cyclobenzaprine (FLEXERIL) 10 MG tablet Take 1 tablet (10 mg total) by mouth 2 (two) times daily as needed for muscle spasms. 08/16/15   Gwyneth SproutWhitney Plunkett, MD  doxycycline (VIBRAMYCIN) 50 MG capsule Take 2 capsules (100 mg total) by mouth 2 (two) times daily. 07/21/15   Irean HongJade J Sung, MD  HYDROcodone-acetaminophen (NORCO/VICODIN) 5-325 MG tablet Take 1-2 tablets by mouth every 6 (six) hours as needed. 08/16/15   Gwyneth SproutWhitney Plunkett, MD  ibuprofen (ADVIL,MOTRIN) 800 MG tablet Take 1 tablet (800 mg total) by mouth every 8 (eight) hours as needed for moderate pain. 07/21/15   Irean HongJade J Sung, MD  omeprazole (PRILOSEC) 20 MG capsule Take 20 mg by mouth daily.     Historical Provider, MD  oxyCODONE-acetaminophen (ROXICET) 5-325 MG tablet Take 1 tablet by mouth every 4 (four) hours as needed for severe pain. 07/21/15  Irean Hong, MD    Allergies Morphine and related and Septra [sulfamethoxazole-trimethoprim]  No family history on file.  Social History Social History  Substance Use Topics  . Smoking status: Never Smoker  . Smokeless tobacco: Never Used  . Alcohol use Yes    Review of Systems Constitutional: No fever/chills Eyes: No visual changes. ENT: No sore throat. Cardiovascular: Denies chest pain. Respiratory: Denies shortness of breath. Gastrointestinal: No abdominal pain.  No nausea, no vomiting.  No diarrhea.  No constipation. Genitourinary: Negative for dysuria. Musculoskeletal: Negative for back  pain. Skin:draining and open wound Neurological: Negative for headaches, focal weakness or numbness.  10-point ROS otherwise negative.  ____________________________________________   PHYSICAL EXAM:  VITAL SIGNS: ED Triage Vitals  Enc Vitals Group     BP 09/26/15 0105 (!) 151/104     Pulse Rate 09/26/15 0105 91     Resp 09/26/15 0105 18     Temp 09/26/15 0105 98.2 F (36.8 C)     Temp Source 09/26/15 0105 Oral     SpO2 09/26/15 0105 98 %     Weight 09/26/15 0105 (!) 304 lb (137.9 kg)     Height 09/26/15 0105 5\' 6"  (1.676 m)     Head Circumference --      Peak Flow --      Pain Score 09/26/15 0106 6     Pain Loc --      Pain Edu? --      Excl. in GC? --     Constitutional: Alert and oriented. Well appearing and in moderate distress. Eyes: Conjunctivae are normal. PERRL. EOMI. Head: Atraumatic. Nose: No congestion/rhinnorhea. Mouth/Throat: Mucous membranes are moist.  Oropharynx non-erythematous. Cardiovascular: Normal rate, regular rhythm. Grossly normal heart sounds.  Good peripheral circulation. Respiratory: Normal respiratory effort.  No retractions. Lungs CTAB. Gastrointestinal: Soft and nontender. No distention. Positive bowel sounds Musculoskeletal: No lower extremity tenderness nor edema.  Neurologic:  Normal speech and language.  Skin:  Erythema to left posterior thigh large open wound approximately 7 x 6 cm with some drainage noted to the distal posterior thigh tenderness to palpation Psychiatric: Mood and affect are normal.   ____________________________________________   LABS (all labs ordered are listed, but only abnormal results are displayed)  Labs Reviewed  COMPREHENSIVE METABOLIC PANEL - Abnormal; Notable for the following:       Result Value   ALT 12 (*)    All other components within normal limits  CBC WITH DIFFERENTIAL/PLATELET - Abnormal; Notable for the following:    Hemoglobin 11.5 (*)    HCT 34.6 (*)    RDW 16.3 (*)    All other  components within normal limits   ____________________________________________  EKG  none ____________________________________________  RADIOLOGY  CT left lower extremity ____________________________________________   PROCEDURES  Procedure(s) performed: None  Procedures  Critical Care performed: No  ____________________________________________   INITIAL IMPRESSION / ASSESSMENT AND PLAN / ED COURSE  Pertinent labs & imaging results that were available during my care of the patient were reviewed by me and considered in my medical decision making (see chart for details).  This is a 40 year old female who comes into the hospital today with draining wound. I will give the patient a dose of vancomycin and I will send the patient for a CT scan to evaluate for possible necrotizing fasciitis or deeper infection. The patient will be reassessed once I received the results of the CT.  Clinical Course  Value Comment By Time  CT FEMUR LEFT  W CONTRAST 1. Multiple pockets of fluid within the proximal lateral thigh, and lateral to the left hip, and also along the soft tissues of the posterior lower thigh, with diffuse surrounding soft tissue inflammation. Posterior fluid collections demonstrate peripheral enhancement, concerning for evolving abscess, with prominent overlying soft tissue defect at the posterior lower thigh, and mild associated soft tissue air. Would correlate for any evidence of infection with a gas producing organism. 2. Prominent 7.2 cm collection of fluid along the posterior proximal lower leg, incompletely imaged on this study. 3. Left femur appears intact.   Rebecka Apley, MD 09/03 [phone]   I contacted Dr. Evette Cristal who reports that he will come into the hospital for evaluation of the patient's wound. The patient's care will be signed out the Dr. Roxan Hockey who will follow up surgery's recommendations  ____________________________________________   FINAL  CLINICAL IMPRESSION(S) / ED DIAGNOSES  Final diagnoses:  Cellulitis  Cellulitis of left lower extremity  Wound infection (HCC)  Abscess      NEW MEDICATIONS STARTED DURING THIS VISIT:  New Prescriptions   No medications on file     Note:  This document was prepared using Dragon voice recognition software and may include unintentional dictation errors.    Rebecka Apley, MD 09/26/15 [phone]
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
## 2015-09-26 NOTE — Consult Note (Signed)
[facility] - [place] at [facility]   PATIENT NAME: Barbara Hawkins    MR#:  [id number]  DATE OF BIRTH:  [birth year]  DATE OF ADMISSION:  09/26/2015  PRIMARY CARE PHYSICIAN: No PCP Per Patient   CONSULT REQUESTING/REFERRING PHYSICIAN: Dr. Evette Cristal  REASON FOR CONSULT: Elevated blood pressure  CHIEF COMPLAINT:   Chief Complaint  Patient presents with  . Wound Check    HISTORY OF PRESENT ILLNESS:  Barbara Hawkins  is a 40 y.o. female with a known history of Ehler Danlos syndrome presents to the emergency room due to worsening swelling and pain in her left thigh and leg. Patient has had a wound for 4 weeks and was seen at the wound care center and treated with antibiotics. Due to no improvement patient presented to the emergency room. Here CT scan of the left lower extremity showed fluid collection with enhancement in left thigh and lower leg region. Here patient has been found to have elevated blood pressure in the 150s to 170 systolic. No history of hypertension. Patient mentions her blood pressure tends to run high when she is stressed or in pain and sick.  PAST MEDICAL HISTORY:   Past Medical History:  Diagnosis Date  . Diverticulitis   . Ehlers-Danlos disease     PAST SURGICAL HISTOIRY:   Past Surgical History:  Procedure Laterality Date  . FOOT SURGERY    . TUBAL LIGATION Bilateral     SOCIAL HISTORY:   Social History  Substance Use Topics  . Smoking status: Never Smoker  . Smokeless tobacco: Never Used  . Alcohol use Yes    FAMILY HISTORY:  No family history on file.  CVA in brother and mother  DRUG ALLERGIES:   Allergies  Allergen Reactions  . Morphine And Related Itching and Swelling  . Septra [Sulfamethoxazole-Trimethoprim] Hives and Swelling    REVIEW OF SYSTEMS:   Review of Systems  Constitutional: Positive for malaise/fatigue. Negative for chills and fever.  HENT: Negative for sore throat.   Eyes: Negative for blurred  vision, double vision and pain.  Respiratory: Negative for cough, hemoptysis, shortness of breath and wheezing.   Cardiovascular: Negative for chest pain, palpitations, orthopnea and leg swelling.  Gastrointestinal: Negative for abdominal pain, constipation, diarrhea, heartburn, nausea and vomiting.  Genitourinary: Negative for dysuria and hematuria.  Musculoskeletal: Positive for myalgias. Negative for back pain and joint pain.  Skin: Negative for rash.  Neurological: Positive for weakness. Negative for sensory change, speech change, focal weakness and headaches.  Endo/Heme/Allergies: Does not bruise/bleed easily.  Psychiatric/Behavioral: Negative for depression. The patient is not nervous/anxious.      MEDICATIONS AT HOME:   Prior to Admission medications   Medication Sig Start Date End Date Taking? Authorizing Provider  aspirin EC 81 MG tablet Take 81 mg by mouth as needed for mild pain or moderate pain.    Historical Provider, MD  cephALEXin (KEFLEX) 500 MG capsule Take 1 capsule (500 mg total) by mouth 3 (three) times daily. 07/14/15   Jami L Hagler, PA-C  cyclobenzaprine (FLEXERIL) 10 MG tablet Take 1 tablet (10 mg total) by mouth 2 (two) times daily as needed for muscle spasms. 08/16/15   Gwyneth Sprout, MD  doxycycline (VIBRAMYCIN) 50 MG capsule Take 2 capsules (100 mg total) by mouth 2 (two) times daily. 07/21/15   Irean Hong, MD  HYDROcodone-acetaminophen (NORCO/VICODIN) 5-325 MG tablet Take 1-2 tablets by mouth every 6 (six) hours as needed. 08/16/15   Gwyneth Sprout, MD  ibuprofen (ADVIL,MOTRIN) 800 MG tablet Take 1 tablet (800 mg total) by mouth every 8 (eight) hours as needed for moderate pain. 07/21/15   Irean HongJade J Sung, MD  omeprazole (PRILOSEC) 20 MG capsule Take 20 mg by mouth daily.     Historical Provider, MD  oxyCODONE-acetaminophen (ROXICET) 5-325 MG tablet Take 1 tablet by mouth every 4 (four) hours as needed for severe pain. 07/21/15   Irean HongJade J Sung, MD      VITAL SIGNS:   Blood pressure (!) 149/79, pulse 94, temperature 98.2 F (36.8 C), temperature source Oral, resp. rate 18, height 5\' 6"  (1.676 m), weight (!) 137.9 kg (304 lb), last menstrual period 08/30/2015, SpO2 100 %.  PHYSICAL EXAMINATION:  GENERAL:  40 y.o.-year-old patient lying in the bed with no acute distress.  EYES: Pupils equal, round, reactive to light and accommodation. No scleral icterus. Extraocular muscles intact.  HEENT: Head atraumatic, normocephalic. Oropharynx and nasopharynx clear.  NECK:  Supple, no jugular venous distention. No thyroid enlargement, no tenderness.  LUNGS: Normal breath sounds bilaterally, no wheezing, rales,rhonchi or crepitation. No use of accessory muscles of respiration.  CARDIOVASCULAR: S1, S2 normal. No murmurs, rubs, or gallops.  ABDOMEN: Soft, nontender, nondistended. Bowel sounds present. No organomegaly or mass.  EXTREMITIES: No pedal edema, cyanosis, or clubbing.  NEUROLOGIC: Cranial nerves II through XII are intact. Muscle strength 5/5 in all extremities. Sensation intact. Gait not checked.  PSYCHIATRIC: The patient is alert and oriented x 3.  SKIN: No obvious rash, lesion, or ulcer.   LABORATORY PANEL:   CBC  Recent Labs Lab 09/26/15 0112  WBC 8.9  HGB 11.5*  HCT 34.6*  PLT 269   ------------------------------------------------------------------------------------------------------------------  Chemistries   Recent Labs Lab 09/26/15 0112  NA 136  K 3.9  CL 107  CO2 22  GLUCOSE 96  BUN 12  CREATININE 0.69  CALCIUM 9.0  AST 17  ALT 12*  ALKPHOS 76  BILITOT 0.6   ------------------------------------------------------------------------------------------------------------------  Cardiac Enzymes No results for input(s): TROPONINI in the last 168 hours. ------------------------------------------------------------------------------------------------------------------  RADIOLOGY:  Ct Femur Left W Contrast  Result Date:  09/26/2015 CLINICAL DATA:  Worsening wound at the left posterior thigh, chronic onset, with large amount of drainage. Initial encounter. EXAM: CT OF THE LOWER LEFT EXTREMITY WITH CONTRAST TECHNIQUE: Multidetector CT imaging of the lower left extremity was performed according to the standard protocol following intravenous contrast administration. COMPARISON:  None. CONTRAST:  100mL ISOVUE-300 IOPAMIDOL (ISOVUE-300) INJECTION 61% FINDINGS: Bones/Joint/Cartilage The left femur appears intact. The left femoral head remains seated at the acetabulum. The left knee joint is unremarkable in appearance. No knee joint effusion is identified. Articular cartilage is not well assessed on CT. Ligaments Suboptimally assessed by CT. Muscles and Tendons The visualized musculature of the left femur and left buttocks is grossly unremarkable. The visualized left calf musculature is within normal limits. The visualized tendons are grossly unremarkable in appearance. Soft tissues Multiple pockets of fluid are seen tracking within the proximal lateral thigh and lateral to the left hip, and also along the soft tissues of the posterior lower thigh, with diffuse surrounding soft tissue inflammation. Fluid collections extend to the level of the fascia. Posterior fluid collections demonstrate peripheral enhancement, concerning for evolving abscess, with a prominent overlying soft tissue defect at the posterior lower thigh, and mild associated soft tissue air. Would correlate for any evidence of infection with a gas producing organism. There is also a prominent 7.2 cm collection of fluid along the posterior proximal lower leg,  incompletely imaged on this study. IMPRESSION: 1. Multiple pockets of fluid within the proximal lateral thigh, and lateral to the left hip, and also along the soft tissues of the posterior lower thigh, with diffuse surrounding soft tissue inflammation. Posterior fluid collections demonstrate peripheral enhancement,  concerning for evolving abscess, with prominent overlying soft tissue defect at the posterior lower thigh, and mild associated soft tissue air. Would correlate for any evidence of infection with a gas producing organism. 2. Prominent 7.2 cm collection of fluid along the posterior proximal lower leg, incompletely imaged on this study. 3. Left femur appears intact. Electronically Signed   By: Roanna Raider M.D.   On: 09/26/2015 06:36    EKG:  No orders found for this or any previous visit.  IMPRESSION AND PLAN:   * Elevated blood pressure without diagnosis of hypertension We'll give 1 dose of IV labetalol prior to her surgery. Monitor blood pressure. Will start Norvasc if blood pressure is consistently elevated. No oral medications at this time. Blood pressure likely elevated due to pain and stress.  * Left thigh and leg abscess Discussed with Dr. Evette Cristal. Low risk for surgery. Further management as per Dr. Evette Cristal.  * Mild anemia Needs monitoring  All the records are reviewed and case discussed with Consulting provider. Management plans discussed with the patient, family and they are in agreement.  TOTAL TIME TAKING CARE OF THIS PATIENT: 40 minutes.    Milagros Loll R M.D on 09/26/2015 at 11:09 AM  Between 7am to 6pm - Pager - [phone]  After 6pm go to [email or web address] - password [email or web address]  [facility]  Office  [phone]  CC: Primary care Physician: No PCP Per Patient  Note: This dictation was prepared with Dragon dictation along with smaller phrase technology. Any transcriptional errors that result from this process are unintentional.

## 2015-09-26 NOTE — Transfer of Care (Signed)
Immediate Anesthesia Transfer of Care Note  Patient: Barbara Hawkins  Procedure(s) Performed: Procedure(s): INCISION AND DRAINAGE ABSCESS (Left) APPLICATION OF WOUND VAC (Left)  Patient Location: PACU  Anesthesia Type:General  Level of Consciousness: awake and sedated  Airway & Oxygen Therapy: Patient Spontanous Breathing and Patient connected to face mask oxygen  Post-op Assessment: Report given to RN and Post -op Vital signs reviewed and stable  Post vital signs: Reviewed and stable  Last Vitals:  Vitals:   09/26/15 0839 09/26/15 0930  BP: (!) 153/90 (!) 149/79  Pulse: 96 94  Resp:    Temp:      Last Pain:  Vitals:   09/26/15 0106  TempSrc:   PainSc: 6          Complications: No apparent anesthesia complications

## 2015-09-26 NOTE — ED Notes (Signed)
Patient belongings- clothes, glasses, purse, and cell phone sent with patient daughter- Barbara Hawkins

## 2015-09-26 NOTE — ED Notes (Signed)
Dr. Evette CristalSankar currently at bedside assessing patient.

## 2015-09-26 NOTE — Anesthesia Preprocedure Evaluation (Addendum)
Anesthesia Evaluation  Patient identified by MRN, date of birth, ID band Patient awake    Reviewed: Allergy & Precautions, NPO status , Patient's Chart, lab work & pertinent test results  History of Anesthesia Complications Negative for: history of anesthetic complications  Airway Mallampati: II  TM Distance: >3 FB Neck ROM: Full    Dental no notable dental hx.    Pulmonary neg pulmonary ROS, neg sleep apnea, neg COPD,    breath sounds clear to auscultation- rhonchi (-) wheezing      Cardiovascular Exercise Tolerance: Good hypertension (not on medication), (-) CAD and (-) Past MI  Rhythm:Regular Rate:Normal - Systolic murmurs and - Diastolic murmurs    Neuro/Psych negative neurological ROS  negative psych ROS   GI/Hepatic Neg liver ROS, GERD  Medicated,  Endo/Other  neg diabetesMorbid obesity  Renal/GU negative Renal ROS     Musculoskeletal Ehlers danlos    Abdominal (+) + obese,   Peds  Hematology negative hematology ROS (+)   Anesthesia Other Findings   Reproductive/Obstetrics                            Anesthesia Physical Anesthesia Plan  ASA: II  Anesthesia Plan: General   Post-op Pain Management:    Induction: Intravenous  Airway Management Planned: Oral ETT  Additional Equipment:   Intra-op Plan:   Post-operative Plan: Extubation in OR  Informed Consent: I have reviewed the patients History and Physical, chart, labs and discussed the procedure including the risks, benefits and alternatives for the proposed anesthesia with the patient or authorized representative who has indicated his/her understanding and acceptance.   Dental advisory given  Plan Discussed with: CRNA and Anesthesiologist  Anesthesia Plan Comments:         Anesthesia Quick Evaluation

## 2015-09-26 NOTE — ED Notes (Signed)
Patient ambulated to the toilet without difficulty.

## 2015-09-26 NOTE — Anesthesia Procedure Notes (Signed)
Procedure Name: Intubation Date/Time: 09/26/2015 11:32 AM Performed by: Junious SilkNOLES, Yanelli Zapanta Pre-anesthesia Checklist: Patient identified, Patient being monitored, Timeout performed, Emergency Drugs available and Suction available Patient Re-evaluated:Patient Re-evaluated prior to inductionOxygen Delivery Method: Circle system utilized Preoxygenation: Pre-oxygenation with 100% oxygen Intubation Type: IV induction Ventilation: Mask ventilation without difficulty Laryngoscope Size: 3 and McGraph Grade View: Grade I Tube type: Oral Tube size: 7.0 mm Number of attempts: 1 Airway Equipment and Method: Stylet Placement Confirmation: ETT inserted through vocal cords under direct vision,  positive ETCO2 and breath sounds checked- equal and bilateral Secured at: 21 cm Tube secured with: Tape Dental Injury: Teeth and Oropharynx as per pre-operative assessment

## 2015-09-26 NOTE — ED Notes (Signed)
Wound is approx 9cm circular circumfrence, what appears to be fat is noted to wound bed. Pt with areas of dead skin noted around perimeter of wound. Redness noted around perimeter of wound.

## 2015-09-26 NOTE — ED Notes (Signed)
Patient transported to CT 

## 2015-09-26 NOTE — ED Notes (Signed)
Dr. Elpidio AnisSudini at bedside currently seeing patient

## 2015-09-26 NOTE — ED Notes (Signed)
Pt up to restroom to void. Pt updated on progress of ct results.

## 2015-09-26 NOTE — ED Triage Notes (Signed)
Patient has a wound to left posterior thigh that she has been seen for a wound care clinic. Patient reports that the wound was debrided on aug 21. Patient states that the wound has become worse and has started having a large amount of drainage. Patient states that he next appointment at the wound care clinic is sept 11.

## 2015-09-26 NOTE — Op Note (Signed)
Preop diagnosis: Necrotic open wound left posterior thigh, abscess versus hematoma versus seroma right medial calf and left posterior calf  Post op diagnosis: Necrotic wound with the subcutaneous fluid in the left thigh posterior. Seroma in the right medial calf and left posterior calf  Operation: 1. Debridement and drainage of the open wound in the left posterior thigh. 2. Ultrasound-guided aspiration of seroma on the right medial calf and left posterior calf  Surgeon: Kathreen CosierS. G. Sankar R  Assistant:     Anesthesia: Gen.  Complications: None  EBL: Minimal  Drains: Wound VAC  Description: Patient was put to sleep in the supine position the operating table and given the large body habitus the left leg was placed on a side arm operating table extension. Timeout was performed. Ultrasound probe was used to identify the smaller seroma cavity in the medial right calf this appeared to be primarily 10 fluid with some floating material. With ultrasound guidance and 16-gauge needle was utilized and about 35-40 mL of slightly dark thin fluid was aspirated with resolution of the seroma. Small part of the fluid was sent for culture. Using the ultrasound the posterior calf and posterior knee area was evaluated showing a large deep-seated seroma. Again with ultrasound guidance an 818-gauge spinal needle was used to enter the pocket and drained 205 mL of the predominantly serous fluid. Following this the left thigh and knee area were then prepped and draped as sterile field the open wound is about 7 cm in diameter located in the distal posterior left thigh- it was covered with the one PV necrotic fatty tissue. With the use seeing blunt and sharp dissection bulk of the subcutaneous fat that appeared devitalized or necrotic was debrided completely and some thin fluid was encountered in the deeper layers which were suctioned out. By probing didn't appear to be subcutaneous pockets extending above and below the wound in  the deep subcutaneous plane. A suction cannula was then used to free up all these loculations and drained this space all around. Part of the debrided tissue was sent for culture also. A wound VAC was then placed with an airtight seal. Patient was subsequently extubated and returned recovery room stable condition

## 2015-09-27 NOTE — Plan of Care (Signed)
Problem: Physical Regulation: Goal: Will remain free from infection Outcome: Progressing Pt receiving antibiotics  Problem: Activity: Goal: Risk for activity intolerance will decrease Outcome: Progressing Pt walking to bathroom, sitting in chair

## 2015-09-27 NOTE — Care Management Note (Addendum)
Case Management Note  Patient Details  Name: Barbara Hawkins MRN: [id number] Date of Birth: [birth year]  Subjective/Objective:      Left message on phone of Barbara Hawkins [phone] in Patient Financial services to please discuss Medicaid or other financial resources for uninsured Barbara Hawkins. [facility] home wound vac for Barbara Hawkins and arranging for a charity wound vac. Barbara KaufmannMelissa will speak with Barbara Hawkins today Barbara KaufmannMelissa has the home wound vac request form signed by Dr Tonita CongWoodham and [facility]. Barbara Hawkins qualifies for charity home health services with [facility].                Action/Plan:   Expected Discharge Date:                  Expected Discharge Plan:     In-House Referral:     Discharge planning Services     Post Acute Care Choice:    Choice offered to:     DME Arranged:    DME Agency:     HH Arranged:    HH Agency:     Status of Service:     If discussed at [employer] Length of Stay Meetings, dates discussed:    Additional Comments:  Barbara Hawkins A, RN 09/27/2015, 9:48 AM

## 2015-09-27 NOTE — Progress Notes (Signed)
1 Day Post-Op   Subjective:  40 year old female one day status post incision and drainage of left posterior calf wound with placement of wound VAC. Patient states she has done well. Tolerating her diet and oral medications.  Vital signs in last 24 hours: Temp:  [97.1 F (36.2 C)-98.8 F (37.1 C)] 98.2 F (36.8 C) (09/04 0424) Pulse Rate:  [66-86] 72 (09/04 0424) Resp:  [18-24] 20 (09/04 0424) BP: (101-126)/(51-79) 117/57 (09/04 0424) SpO2:  [96 %-99 %] 97 % (09/04 0424)    Intake/Output from previous day: 09/03 0701 - 09/04 0700 In: 1646 [P.O.:480; I.V.:1056; IV Piggyback:110] Out: 1210 [Urine:1200; Blood:10]  GI: soft, non-tender; bowel sounds normal; no masses,  no organomegaly  Skin: Left posterior calf wound with wound VAC in place. Good seal and device. No evidence of spreading infection or abscess. Evidence of resolving seromas distal this on her left calf and negatives" of her right calf.  Lab Results:  CBC  Recent Labs  09/26/15 0112  WBC 8.9  HGB 11.5*  HCT 34.6*  PLT 269   CMP     Component Value Date/Time   NA 136 09/26/2015 0112   NA 140 01/30/2014 1513   K 3.9 09/26/2015 0112   K 4.2 01/30/2014 1513   CL 107 09/26/2015 0112   CL 107 01/30/2014 1513   CO2 22 09/26/2015 0112   CO2 27 01/30/2014 1513   GLUCOSE 96 09/26/2015 0112   GLUCOSE 98 01/30/2014 1513   BUN 12 09/26/2015 0112   BUN 5 (L) 01/30/2014 1513   CREATININE 0.69 09/26/2015 0112   CREATININE 0.77 01/30/2014 1513   CALCIUM 9.0 09/26/2015 0112   CALCIUM 8.9 01/30/2014 1513   PROT 7.2 09/26/2015 0112   PROT 7.3 01/30/2014 1513   ALBUMIN 3.7 09/26/2015 0112   ALBUMIN 3.6 01/30/2014 1513   AST 17 09/26/2015 0112   AST 24 01/30/2014 1513   ALT 12 (L) 09/26/2015 0112   ALT 28 01/30/2014 1513   ALKPHOS 76 09/26/2015 0112   ALKPHOS 96 01/30/2014 1513   BILITOT 0.6 09/26/2015 0112   BILITOT 0.5 01/30/2014 1513   GFRNONAA >60 09/26/2015 0112   GFRNONAA >60 01/30/2014 1513   GFRNONAA >60  02/27/2013 1736   GFRAA >60 09/26/2015 0112   GFRAA >60 01/30/2014 1513   GFRAA >60 02/27/2013 1736   PT/INR No results for input(s): LABPROT, INR in the last 72 hours.  Studies/Results: Ct Femur Left W Contrast  Result Date: 09/26/2015 CLINICAL DATA:  Worsening wound at the left posterior thigh, chronic onset, with large amount of drainage. Initial encounter. EXAM: CT OF THE LOWER LEFT EXTREMITY WITH CONTRAST TECHNIQUE: Multidetector CT imaging of the lower left extremity was performed according to the standard protocol following intravenous contrast administration. COMPARISON:  None. CONTRAST:  100mL ISOVUE-300 IOPAMIDOL (ISOVUE-300) INJECTION 61% FINDINGS: Bones/Joint/Cartilage The left femur appears intact. The left femoral head remains seated at the acetabulum. The left knee joint is unremarkable in appearance. No knee joint effusion is identified. Articular cartilage is not well assessed on CT. Ligaments Suboptimally assessed by CT. Muscles and Tendons The visualized musculature of the left femur and left buttocks is grossly unremarkable. The visualized left calf musculature is within normal limits. The visualized tendons are grossly unremarkable in appearance. Soft tissues Multiple pockets of fluid are seen tracking within the proximal lateral thigh and lateral to the left hip, and also along the soft tissues of the posterior lower thigh, with diffuse surrounding soft tissue inflammation. Fluid collections extend to  the level of the fascia. Posterior fluid collections demonstrate peripheral enhancement, concerning for evolving abscess, with a prominent overlying soft tissue defect at the posterior lower thigh, and mild associated soft tissue air. Would correlate for any evidence of infection with a gas producing organism. There is also a prominent 7.2 cm collection of fluid along the posterior proximal lower leg, incompletely imaged on this study. IMPRESSION: 1. Multiple pockets of fluid within the  proximal lateral thigh, and lateral to the left hip, and also along the soft tissues of the posterior lower thigh, with diffuse surrounding soft tissue inflammation. Posterior fluid collections demonstrate peripheral enhancement, concerning for evolving abscess, with prominent overlying soft tissue defect at the posterior lower thigh, and mild associated soft tissue air. Would correlate for any evidence of infection with a gas producing organism. 2. Prominent 7.2 cm collection of fluid along the posterior proximal lower leg, incompletely imaged on this study. 3. Left femur appears intact. Electronically Signed   By: Roanna Raider M.D.   On: 09/26/2015 06:36    Assessment/Plan: 40 year old female one day status post incision, drainage, wound VAC placement to left lower extremity traumatic wound. Doing well. Discussed with the patient that we would attempt to do a bedside exchange of her wound VAC sponge tomorrow. Her length of stay would be determined by how that VAC change wet. If there is no evidence of need to return to the operating room she could likely be discharged home after that. Encourage ambulation, incentive spirometer usage, oral intake. Saline lock her IV today.   Ricarda Frame, MD FACS General Surgeon  09/27/2015

## 2015-09-28 ENCOUNTER — Encounter: Payer: Self-pay | Admitting: General Surgery

## 2015-09-28 MED ORDER — OXYCODONE HCL 5 MG PO TABS: 5.0000 mg | ORAL_TABLET | ORAL | 0 refills | Status: DC | PRN

## 2015-09-28 MED ORDER — HYDROMORPHONE HCL 1 MG/ML IJ SOLN
1.0000 mg | Freq: Once | INTRAMUSCULAR | Status: AC
  Administered 2015-09-28: 1 mg via INTRAVENOUS
  Filled 2015-09-28: qty 1

## 2015-09-28 MED ORDER — AMOXICILLIN-POT CLAVULANATE 875-125 MG PO TABS: 1.0000 | ORAL_TABLET | Freq: Two times a day (BID) | ORAL | 0 refills | Status: DC

## 2015-09-28 MED ORDER — HYDROMORPHONE HCL 1 MG/ML IJ SOLN: 1.0000 mg | Freq: Once | INTRAMUSCULAR | Status: DC

## 2015-09-28 NOTE — Discharge Instructions (Signed)
Vacuum-Assisted Closure Therapy Vacuum-assisted closure (VAC) therapy uses a device that removes fluid and germs from wounds to help them heal. It is used on wounds that cannot be closed with stitches. They often heal slowly. Vacuum-assisted therapy helps the wound stay clean and healthy while the open wound slowly grows back together. Vacuum-assisted closure therapy uses a bandage (dressing) that is made of foam. It is put inside the wound. Then, a drape is placed over the wound. This drape sticks to your skin to keep air out, and to protect the wound. A tube is hooked up to a small pump and is attached to the drape. The pump sucks out the fluid and germs. Vacuum-assisted closure therapy can also help reduce the bad smell that comes from the wound. HOW DOES IT WORK?  The vacuum pump pulls fluid through the foam dressing. The dressing may wrinkle during this process. The fluid goes into the tube and away from the wound. The fluid then goes into a container. The fluid in the container must be replaced if it is full or at least once a week, even if the container is not full. The pulling from the pump helps to close the wound and bring better circulation to the wound area. The foam dressing covers and protects the wound. It helps your wound heal faster.  HOW DOES IT FEEL?   You might feel a little pulling when the pump is on.  You might also feel a mild vibrating sensation.  You might feel some discomfort when the dressing is taken off. CAN I MOVE AROUND WITH VACUUM-ASSISTED CLOSURE THERAPY? Yes, it has a backup battery which is used when the machine is not plugged in, as long as the battery is working, you can move freely. WHAT ARE SOME THINGS I MUST KNOW?  Do not turn off the pump yourself, unless instructed to do so by your healthcare provider, such as for bathing.  Do not take off the dressing yourself, unless instructed to do so by your caregiver.  You can wash or shower with the dressing.  However, do not take the pump into the shower. Make sure the wound dressing is protected and covered with plastic. The wound area must stay dry.  Do not turn off the pump for more than 2 hours. If the pump is off for more than 2 hours, your nurse must change your dressing.  Check frequently that the machine is on, that the machine indicates the therapy is on, and that all clamps are open. THE ALARM IS SOUNDING! WHAT SHOULD I DO?   Stay calm.  Do not turn off the pump or do anything with the dressing.  Call your clinic or caregiver right away if the alarm goes off and you cannot fix the problem. Some reasons the alarm might go off include:  The fluid collection container is full.  The battery is low.  The dressing has a leak.  Explain to your caregiver what is happening. Follow the instructions you receive. WHEN SHOULD I CALL FOR HELP?   You have severe pain.  You have difficulty breathing.  You have bleeding that will not stop.  Your wound smells bad.  You have redness, swelling, or fluid leaking from your wound.  Your alarm goes off and you do not know what to do.  You have a fever.  Your wound itches severely.  Your dressing changes are often painful or bleeding often occurs.  You have diarrhea.  You have a sore throat.    You have a rash around the dressing or anywhere else on your body.  You feel nauseous.  You feel dizzy or weak.  The VAC machine has been off for more than 2 hours. HOW DO I GET READY TO GO HOME WITH A PUMP?  A trained caregiver will talk to you and answer your questions about your vacuum-assisted closure therapy before you go home. He or she will explain what to expect. A caregiver will come to your home to apply the pump and care for your wound. The at-home caregiver will be available for questions and will come back for the scheduled dressing changes, usually every 48-72 hours (or more often for severely infected wounds). Your at-home  caregiver will also come if you are having an unexpected problem. If you have questions or do not know what to do when you go home, talk to your healthcare provider.   This information is not intended to replace advice given to you by your health care provider. Make sure you discuss any questions you have with your health care provider.   Document Released: 12/23/2007 Document Revised: 09/11/2012 Document Reviewed: 12/23/2010 Elsevier Interactive Patient Education 2016 [employer] Inc[employer]  

## 2015-09-28 NOTE — Progress Notes (Signed)
Pt A and O x 4. VSS. Pt tolerating diet well. No complaints of pain or nausea. IV removed intact, prescriptions given. Pt voiced understanding of discharge instructions with no further questions. Pt received antibiotic from med management. Pt discharged via wheelchair with nurse. Wound Vac was removed and wet to dry dressing placed prior to discharge.

## 2015-09-28 NOTE — Progress Notes (Signed)
Per Dr. Tonita CongWoodham okat to place order for Dilaudid img IV once prior to change of wound vac

## 2015-09-28 NOTE — Care Management (Addendum)
Patient to discharge home today with wound vac.  Patient has been approved for [facility] services through [facility] for RN and wound vac.  Patient to discharge with wet to dry dressing.  Home health nurse to place wound vac morning of 09/29/15.  Vac dressings to be changed MWF.   Patient to discharge on Augmentin $25 coupon provided from [employer] however patient is not sure that she will have the funds to obtain them.  I have faxed the script to medication management, and they will be able to fill it at no cost to the patient.  Daughter to pick up at discharge. Marland Kitchen. RNCM signing off

## 2015-09-28 NOTE — Progress Notes (Signed)
2 Days Post-Op   Subjective:  40 year old female status post I&D of a left posterior thigh wound. Wound VAC in place. Patient without complaints other than pain to the area.  Vital signs in last 24 hours: Temp:  [97.4 F (36.3 C)-98.3 F (36.8 C)] 98.3 F (36.8 C) (09/05 0407) Pulse Rate:  [71-95] 77 (09/05 0407) Resp:  [16-20] 20 (09/05 0407) BP: (114-122)/(63-80) 118/63 (09/05 0407) SpO2:  [99 %-100 %] 100 % (09/05 0407)    Intake/Output from previous day: 09/04 0701 - 09/05 0700 In: 904.3 [P.O.:480; I.V.:293.6; IV Piggyback:130.7] Out: 1800 [Urine:1800]  GI: soft, non-tender; bowel sounds normal; no masses,  no organomegaly Skin: Wound VAC in place to left posterior thigh wound. Good seal. No evidence of spreading erythema or infection.  Lab Results:  CBC  Recent Labs  09/26/15 0112  WBC 8.9  HGB 11.5*  HCT 34.6*  PLT 269   CMP     Component Value Date/Time   NA 136 09/26/2015 0112   NA 140 01/30/2014 1513   K 3.9 09/26/2015 0112   K 4.2 01/30/2014 1513   CL 107 09/26/2015 0112   CL 107 01/30/2014 1513   CO2 22 09/26/2015 0112   CO2 27 01/30/2014 1513   GLUCOSE 96 09/26/2015 0112   GLUCOSE 98 01/30/2014 1513   BUN 12 09/26/2015 0112   BUN 5 (L) 01/30/2014 1513   CREATININE 0.69 09/26/2015 0112   CREATININE 0.77 01/30/2014 1513   CALCIUM 9.0 09/26/2015 0112   CALCIUM 8.9 01/30/2014 1513   PROT 7.2 09/26/2015 0112   PROT 7.3 01/30/2014 1513   ALBUMIN 3.7 09/26/2015 0112   ALBUMIN 3.6 01/30/2014 1513   AST 17 09/26/2015 0112   AST 24 01/30/2014 1513   ALT 12 (L) 09/26/2015 0112   ALT 28 01/30/2014 1513   ALKPHOS 76 09/26/2015 0112   ALKPHOS 96 01/30/2014 1513   BILITOT 0.6 09/26/2015 0112   BILITOT 0.5 01/30/2014 1513   GFRNONAA >60 09/26/2015 0112   GFRNONAA >60 01/30/2014 1513   GFRNONAA >60 02/27/2013 1736   GFRAA >60 09/26/2015 0112   GFRAA >60 01/30/2014 1513   GFRAA >60 02/27/2013 1736   PT/INR No results for input(s): LABPROT, INR in  the last 72 hours.  Studies/Results: No results found.  Assessment/Plan: 40 year old female status post I&D of a traumatic wound to the left posterior thigh. Wound VAC change to the bedside today without any difficulty. Wound bed with some evidence of fat necrosis but otherwise healthy-appearing tissues. No evidence of deep infection. Once home wound VAC is known to be available we will arrange for discharge home later today.   Ricarda Frameharles Ke[place], MD FACS General Surgeon  09/28/2015

## 2015-09-28 NOTE — Discharge Summary (Signed)
Patient ID: Barbara Hawkins MRN: [id number] DOB/AGE: Jun 20, 1975 40 y.o.  Admit date: 09/26/2015 Discharge date: 09/28/2015  Discharge Diagnoses:  Traumatic Wound to left posterior thigh  Procedures Performed: Incision and Drainage of left thigh wound  Discharged Condition: good  Hospital Course: Taken to OR for debridement and placement of wound vac. Tolerated procedure first vac change well. At time of discharge wound bed was a combination of healthy granulation tissue and fat that is starting to liquify. No evidence of infection at time of discharge.  Discharge Orders: Discharge home; Continue wound vac therapy M-W-F with outpatient wound vac.   Disposition: 01-Home or Self Care  Discharge Medications:   Medication List    STOP taking these medications   cephALEXin 500 MG capsule Commonly known as:  KEFLEX   HYDROcodone-acetaminophen 5-325 MG tablet Commonly known as:  NORCO/VICODIN   oxyCODONE-acetaminophen 5-325 MG tablet Commonly known as:  ROXICET     TAKE these medications   amoxicillin-clavulanate 875-125 MG tablet Commonly known as:  AUGMENTIN Take 1 tablet by mouth 2 (two) times daily.   aspirin EC 81 MG tablet Take 81 mg by mouth as needed for mild pain or moderate pain.   cyclobenzaprine 10 MG tablet Commonly known as:  FLEXERIL Take 1 tablet (10 mg total) by mouth 2 (two) times daily as needed for muscle spasms.   doxycycline 50 MG capsule Commonly known as:  VIBRAMYCIN Take 2 capsules (100 mg total) by mouth 2 (two) times daily.   ibuprofen 800 MG tablet Commonly known as:  ADVIL,MOTRIN Take 1 tablet (800 mg total) by mouth every 8 (eight) hours as needed for moderate pain.   omeprazole 20 MG capsule Commonly known as:  PRILOSEC Take 20 mg by mouth daily.   oxyCODONE 5 MG immediate release tablet Commonly known as:  Oxy IR/ROXICODONE Take 1 tablet (5 mg total) by mouth every 4 (four) hours as needed for moderate pain.         Follwup: Follow-up Information    Gladis Riffleatherine L Loflin, MD. Go in 2 week(s).   Specialty:  Surgery Contact information: [address] [place] [id number] [phone]           Signed: Ricarda FrameCharles Geniyah Eischeid 09/28/2015, 2:10 PM

## 2015-10-02 LAB — AEROBIC/ANAEROBIC CULTURE (SURGICAL/DEEP WOUND)

## 2015-10-02 LAB — AEROBIC/ANAEROBIC CULTURE W GRAM STAIN (SURGICAL/DEEP WOUND): Culture: NO GROWTH

## 2015-10-04 ENCOUNTER — Ambulatory Visit: Payer: Self-pay | Admitting: Surgery

## 2015-10-04 DIAGNOSIS — N390 Urinary tract infection, site not specified: Secondary | ICD-10-CM | POA: Insufficient documentation

## 2015-10-08 ENCOUNTER — Telehealth: Payer: Self-pay

## 2015-10-08 NOTE — Telephone Encounter (Signed)
Velna HatchetSheila called from [facility] [phone]- wanted to let Dr.Loflin know that patient is having a foul odor to the discharge. And there is slough tissue in the wound bed. There is redness surrounding the wound but no fever. Patient is moving to [place] over the weekend and her records will be transferred to the office there.  I spoke with Dr.Woodham regarding the above. Patient to follow up in ED if she becomes sick. I called patient at this time and she was instructed to follow up in Emergency room over the weekend if she becomes ill. Patient was instructed to keep follow up with Dr.Loflin on 10/12/15. Patient verbalized understanding.

## 2015-10-12 ENCOUNTER — Ambulatory Visit: Payer: Self-pay | Admitting: Surgery

## 2015-10-14 ENCOUNTER — Encounter: Payer: Self-pay | Admitting: Surgery

## 2015-10-14 ENCOUNTER — Ambulatory Visit (INDEPENDENT_AMBULATORY_CARE_PROVIDER_SITE_OTHER): Payer: Self-pay | Admitting: Surgery

## 2015-10-14 ENCOUNTER — Inpatient Hospital Stay
Admission: AD | Admit: 2015-10-14 | Discharge: 2015-10-18 | DRG: 571 | Disposition: A | Discharge status: Home / Self Care | Payer: No Typology Code available for payment source | Source: Ambulatory Visit | Attending: Surgery | Admitting: Surgery

## 2015-10-14 VITALS — BP 129/81 | HR 98 | Temp 98.6°F | Ht 66.0 in | Wt 296.5 lb

## 2015-10-14 DIAGNOSIS — S71102A Unspecified open wound, left thigh, initial encounter: Principal | ICD-10-CM | POA: Diagnosis present

## 2015-10-14 DIAGNOSIS — Z823 Family history of stroke: Secondary | ICD-10-CM

## 2015-10-14 DIAGNOSIS — Z8249 Family history of ischemic heart disease and other diseases of the circulatory system: Secondary | ICD-10-CM | POA: Diagnosis not present

## 2015-10-14 DIAGNOSIS — Z9889 Other specified postprocedural states: Secondary | ICD-10-CM

## 2015-10-14 DIAGNOSIS — Z882 Allergy status to sulfonamides status: Secondary | ICD-10-CM | POA: Diagnosis not present

## 2015-10-14 DIAGNOSIS — Z9851 Tubal ligation status: Secondary | ICD-10-CM

## 2015-10-14 DIAGNOSIS — Z6841 Body Mass Index (BMI) 40.0 and over, adult: Secondary | ICD-10-CM

## 2015-10-14 DIAGNOSIS — E669 Obesity, unspecified: Secondary | ICD-10-CM | POA: Diagnosis present

## 2015-10-14 DIAGNOSIS — Q796 Ehlers-Danlos syndrome: Secondary | ICD-10-CM | POA: Diagnosis not present

## 2015-10-14 DIAGNOSIS — Z885 Allergy status to narcotic agent status: Secondary | ICD-10-CM | POA: Diagnosis not present

## 2015-10-14 DIAGNOSIS — Z841 Family history of disorders of kidney and ureter: Secondary | ICD-10-CM

## 2015-10-14 DIAGNOSIS — I96 Gangrene, not elsewhere classified: Secondary | ICD-10-CM | POA: Diagnosis present

## 2015-10-14 DIAGNOSIS — Z888 Allergy status to other drugs, medicaments and biological substances status: Secondary | ICD-10-CM

## 2015-10-14 DIAGNOSIS — S71102D Unspecified open wound, left thigh, subsequent encounter: Secondary | ICD-10-CM

## 2015-10-14 DIAGNOSIS — S71109A Unspecified open wound, unspecified thigh, initial encounter: Secondary | ICD-10-CM | POA: Diagnosis present

## 2015-10-14 LAB — COMPREHENSIVE METABOLIC PANEL
ALBUMIN: 3.9 g/dL (ref 3.5–5.0)
ALK PHOS: 97 U/L (ref 38–126)
ALT: 15 U/L (ref 14–54)
AST: 19 U/L (ref 15–41)
Anion gap: 9 (ref 5–15)
BUN: 11 mg/dL (ref 6–20)
CALCIUM: 8.8 mg/dL — AB (ref 8.9–10.3)
CHLORIDE: 108 mmol/L (ref 101–111)
CO2: 20 mmol/L — AB (ref 22–32)
CREATININE: 0.71 mg/dL (ref 0.44–1.00)
GFR calc Af Amer: >60 mL/min (ref >60)
GFR calc non Af Amer: >60 mL/min (ref >60)
GLUCOSE: 90 mg/dL (ref 65–99)
Potassium: 3.3 mmol/L — ABNORMAL LOW (ref 3.5–5.1)
SODIUM: 137 mmol/L (ref 135–145)
Total Bilirubin: 0.5 mg/dL (ref 0.3–1.2)
Total Protein: 7.8 g/dL (ref 6.5–8.1)

## 2015-10-14 LAB — CBC WITH DIFFERENTIAL/PLATELET
BASOS ABS: 0 10*3/uL (ref 0–0.1)
BASOS PCT: 0 %
EOS ABS: 0.1 10*3/uL (ref 0–0.7)
EOS PCT: 1 %
HCT: 38.4 % (ref 35.0–47.0)
HEMOGLOBIN: 12.6 g/dL (ref 12.0–16.0)
Lymphocytes Relative: 22 %
Lymphs Abs: 2.1 10*3/uL (ref 1.0–3.6)
MCH: 28.2 pg (ref 26.0–34.0)
MCHC: 32.8 g/dL (ref 32.0–36.0)
MCV: 85.9 fL (ref 80.0–100.0)
Monocytes Absolute: 0.7 10*3/uL (ref 0.2–0.9)
Monocytes Relative: 7 %
NEUTROS PCT: 70 %
Neutro Abs: 6.9 10*3/uL — ABNORMAL HIGH (ref 1.4–6.5)
PLATELETS: 232 10*3/uL (ref 150–440)
RBC: 4.47 MIL/uL (ref 3.80–5.20)
RDW: 16.4 % — ABNORMAL HIGH (ref 11.5–14.5)
WBC: 9.8 10*3/uL (ref 3.6–11.0)

## 2015-10-14 MED ORDER — ENOXAPARIN SODIUM 40 MG/0.4ML ~~LOC~~ SOLN
40.0000 mg | Freq: Two times a day (BID) | SUBCUTANEOUS | Status: DC
  Administered 2015-10-14 – 2015-10-18 (×7): 40 mg via SUBCUTANEOUS
  Filled 2015-10-14 (×8): qty 0.4

## 2015-10-14 MED ORDER — DOCUSATE SODIUM 100 MG PO CAPS
100.0000 mg | ORAL_CAPSULE | Freq: Two times a day (BID) | ORAL | Status: DC
  Administered 2015-10-15 – 2015-10-18 (×7): 100 mg via ORAL
  Filled 2015-10-14 (×8): qty 1

## 2015-10-14 MED ORDER — PANTOPRAZOLE SODIUM 40 MG PO TBEC
40.0000 mg | DELAYED_RELEASE_TABLET | Freq: Every day | ORAL | Status: DC
  Administered 2015-10-14 – 2015-10-18 (×5): 40 mg via ORAL
  Filled 2015-10-14 (×5): qty 1

## 2015-10-14 MED ORDER — ACETAMINOPHEN 500 MG PO TABS
1000.0000 mg | ORAL_TABLET | Freq: Four times a day (QID) | ORAL | Status: DC
  Administered 2015-10-14 – 2015-10-18 (×14): 1000 mg via ORAL
  Filled 2015-10-14 (×15): qty 2

## 2015-10-14 MED ORDER — SODIUM CHLORIDE 0.9 % IV SOLN
INTRAVENOUS | Status: DC
  Administered 2015-10-14 – 2015-10-15 (×2): via INTRAVENOUS

## 2015-10-14 MED ORDER — ONDANSETRON HCL 4 MG/2ML IJ SOLN
4.0000 mg | Freq: Four times a day (QID) | INTRAMUSCULAR | Status: DC | PRN
  Administered 2015-10-15 – 2015-10-17 (×5): 4 mg via INTRAVENOUS
  Filled 2015-10-14 (×5): qty 2

## 2015-10-14 MED ORDER — KETOROLAC TROMETHAMINE 30 MG/ML IJ SOLN
30.0000 mg | Freq: Four times a day (QID) | INTRAMUSCULAR | Status: DC
  Administered 2015-10-14 – 2015-10-15 (×4): 30 mg via INTRAVENOUS
  Filled 2015-10-14 (×4): qty 1

## 2015-10-14 MED ORDER — PIPERACILLIN-TAZOBACTAM 3.375 G IVPB
3.3750 g | Freq: Three times a day (TID) | INTRAVENOUS | Status: DC
Start: 2015-10-14 — End: 2015-10-14

## 2015-10-14 MED ORDER — OXYCODONE HCL 5 MG PO TABS
5.0000 mg | ORAL_TABLET | ORAL | Status: DC | PRN
Start: 2015-10-14 — End: 2015-10-18
  Administered 2015-10-15: 5 mg via ORAL
  Administered 2015-10-16 – 2015-10-17 (×3): 10 mg via ORAL
  Administered 2015-10-17: 5 mg via ORAL
  Administered 2015-10-18 (×2): 10 mg via ORAL
  Filled 2015-10-14 (×2): qty 2
  Filled 2015-10-14: qty 1
  Filled 2015-10-14 (×2): qty 2
  Filled 2015-10-14: qty 1
  Filled 2015-10-14: qty 2

## 2015-10-14 MED ORDER — VANCOMYCIN HCL IN DEXTROSE 1-5 GM/200ML-% IV SOLN
1000.0000 mg | Freq: Three times a day (TID) | INTRAVENOUS | Status: DC
Start: 2015-10-14 — End: 2015-10-16
  Administered 2015-10-14 – 2015-10-15 (×4): 1000 mg via INTRAVENOUS
  Filled 2015-10-14 (×6): qty 200

## 2015-10-14 MED ORDER — PIPERACILLIN-TAZOBACTAM 4.5 G IVPB
4.5000 g | Freq: Three times a day (TID) | INTRAVENOUS | Status: DC
  Administered 2015-10-14 – 2015-10-17 (×8): 4.5 g via INTRAVENOUS
  Filled 2015-10-14 (×9): qty 100

## 2015-10-14 MED ORDER — HYDROMORPHONE HCL 1 MG/ML IJ SOLN
1.0000 mg | INTRAMUSCULAR | Status: DC | PRN
  Administered 2015-10-14 – 2015-10-18 (×8): 1 mg via INTRAVENOUS
  Filled 2015-10-14 (×8): qty 1

## 2015-10-14 MED ORDER — ONDANSETRON 4 MG PO TBDP
4.0000 mg | ORAL_TABLET | Freq: Four times a day (QID) | ORAL | Status: DC | PRN
  Administered 2015-10-18 (×2): 4 mg via ORAL
  Filled 2015-10-14 (×2): qty 1

## 2015-10-14 NOTE — Progress Notes (Signed)
Please see H&P note

## 2015-10-14 NOTE — Progress Notes (Signed)
Pharmacy Antibiotic Note  Barbara Hawkins is a 40 y.o. female admitted on 10/14/2015 with open thigh wound.  Pharmacy has been consulted for vancomycin dosing. Patient is also on Zosyn.   Plan: Vancomycin 1000 IV every 8 hours.  Goal trough 15-20 mcg/mL. Trough with the 5th dose.  Zosyn 4.5 g EI q 8 hours.     Temp (24hrs), Avg:98.6 F (37 C), Min:98.6 F (37 C), Max:98.6 F (37 C)  No results for input(s): WBC, CREATININE, LATICACIDVEN, VANCOTROUGH, VANCOPEAK, VANCORANDOM, GENTTROUGH, GENTPEAK, GENTRANDOM, TOBRATROUGH, TOBRAPEAK, TOBRARND, AMIKACINPEAK, AMIKACINTROU, AMIKACIN in the last 168 hours.  Estimated Creatinine Clearance: 131.9 mL/min (by C-G formula based on SCr of 0.69 mg/dL).    Allergies  Allergen Reactions  . Morphine Hives  . Morphine And Related Itching and Swelling  . Septra [Sulfamethoxazole-Trimethoprim] Hives and Swelling  . Sulfamethoxazole-Trimethoprim Hives    Antimicrobials this admission: Vancomycin  9/21 >>  Zosyn 9/21 >>   Dose adjustments this admission:   Microbiology results: 9/21 BCx: pending  Thank you for allowing pharmacy to be a part of this patient's care.  Valentina GuChristy, Athira Janowicz D 10/14/2015 6:33 PM

## 2015-10-14 NOTE — Progress Notes (Signed)
Anticoagulation monitoring(Lovenox):  40 yo  female ordered Lovenox 40 mg Q24h  There were no vitals filed for this visit. BMI 48   Lab Results  Component Value Date   CREATININE 0.69 09/26/2015   CREATININE 0.82 09/10/2015   CREATININE 0.86 08/16/2015   Estimated Creatinine Clearance: 131.9 mL/min (by C-G formula based on SCr of 0.69 mg/dL). Hemoglobin & Hematocrit     Component Value Date/Time   HGB 11.5 (L) 09/26/2015 0112   HGB 14.8 01/30/2014 1513   HCT 34.6 (L) 09/26/2015 0112   HCT 44.3 01/30/2014 1513     Per Protocol for Patient with estCrcl > 30 ml/min and BMI > 40, will transition to Lovenox 40 mg Q12h.
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
## 2015-10-14 NOTE — Progress Notes (Signed)
Patient is seen in the chart reviewed. The wound is evaluated. She has a significant necrotic foul-smelling wound in the posterior portion of her left thigh. Etiology is unclear. Her discharge summary and operative notes from her last admission were reviewed. She's been treated with a wound VAC for the last 3 weeks and has not been improving over the last week to 10 days. She was seen in the clinic today with significant necrosis and bowel smell associated with wound. She is readmitted for further wound care.  I'm concerned that her Ehlers-Danlos syndrome has compromised her wound is healing better really don't have any frame of reference here. We will plan to take her to surgery tomorrow for more debridement and wound VAC application. However, we may need to consider a plastic surgery evaluation for advancing skin infection. There does not appear to be any evidence of necrotizing fasciitis or subcutaneous infection. The patient's quite upset at the prospect of further intervention but I do not see any other option currently. We will plan surgical intervention tomorrow.

## 2015-10-15 ENCOUNTER — Encounter: Payer: Self-pay | Admitting: *Deleted

## 2015-10-15 ENCOUNTER — Inpatient Hospital Stay: Payer: No Typology Code available for payment source | Admitting: Anesthesiology

## 2015-10-15 ENCOUNTER — Encounter
Admission: AD | Disposition: A | Discharge status: Home / Self Care | Payer: Self-pay | Source: Ambulatory Visit | Attending: Surgery

## 2015-10-15 DIAGNOSIS — L97102 Non-pressure chronic ulcer of unspecified thigh with fat layer exposed: Secondary | ICD-10-CM

## 2015-10-15 HISTORY — PX: APPLICATION OF WOUND VAC: SHX5189

## 2015-10-15 HISTORY — PX: IRRIGATION AND DEBRIDEMENT ABSCESS: SHX5252

## 2015-10-15 LAB — MRSA PCR SCREENING: MRSA by PCR: NEGATIVE

## 2015-10-15 LAB — POCT PREGNANCY, URINE: PREG TEST UR: NEGATIVE

## 2015-10-15 SURGERY — IRRIGATION AND DEBRIDEMENT ABSCESS
Anesthesia: General | Site: Thigh | Laterality: Left | Wound class: Dirty or Infected

## 2015-10-15 MED ORDER — LIDOCAINE HCL (CARDIAC) 20 MG/ML IV SOLN
INTRAVENOUS | Status: DC | PRN
  Administered 2015-10-15: 60 mg via INTRAVENOUS

## 2015-10-15 MED ORDER — SUCCINYLCHOLINE CHLORIDE 20 MG/ML IJ SOLN
INTRAMUSCULAR | Status: DC | PRN
  Administered 2015-10-15: 100 mg via INTRAVENOUS

## 2015-10-15 MED ORDER — LACTATED RINGERS IV SOLN
INTRAVENOUS | Status: DC | PRN
  Administered 2015-10-15: 16:00:00 via INTRAVENOUS

## 2015-10-15 MED ORDER — SUGAMMADEX SODIUM 200 MG/2ML IV SOLN
INTRAVENOUS | Status: DC | PRN
  Administered 2015-10-15: 280 mg via INTRAVENOUS

## 2015-10-15 MED ORDER — ONDANSETRON HCL 4 MG/2ML IJ SOLN
INTRAMUSCULAR | Status: DC | PRN
  Administered 2015-10-15: 4 mg via INTRAVENOUS

## 2015-10-15 MED ORDER — MENTHOL 3 MG MT LOZG
1.0000 | LOZENGE | OROMUCOSAL | Status: DC | PRN
  Administered 2015-10-15 – 2015-10-16 (×2): 3 mg via ORAL
  Filled 2015-10-15: qty 9

## 2015-10-15 MED ORDER — KCL IN DEXTROSE-NACL 40-5-0.45 MEQ/L-%-% IV SOLN
INTRAVENOUS | Status: DC
  Administered 2015-10-15: 10:00:00 via INTRAVENOUS
  Filled 2015-10-15 (×4): qty 1000

## 2015-10-15 MED ORDER — FENTANYL CITRATE (PF) 100 MCG/2ML IJ SOLN
INTRAMUSCULAR | Status: DC | PRN
  Administered 2015-10-15: 100 ug via INTRAVENOUS

## 2015-10-15 MED ORDER — KETOROLAC TROMETHAMINE 30 MG/ML IJ SOLN
30.0000 mg | Freq: Four times a day (QID) | INTRAMUSCULAR | Status: DC | PRN
  Administered 2015-10-16 – 2015-10-17 (×5): 30 mg via INTRAVENOUS
  Filled 2015-10-15 (×5): qty 1

## 2015-10-15 MED ORDER — PHENYLEPHRINE HCL 10 MG/ML IJ SOLN
INTRAMUSCULAR | Status: DC | PRN
  Administered 2015-10-15 (×2): 100 ug via INTRAVENOUS

## 2015-10-15 MED ORDER — PROPOFOL 10 MG/ML IV BOLUS
INTRAVENOUS | Status: DC | PRN
  Administered 2015-10-15: 200 mg via INTRAVENOUS

## 2015-10-15 MED ORDER — ONDANSETRON HCL 4 MG/2ML IJ SOLN: 4.0000 mg | Freq: Once | INTRAMUSCULAR | Status: DC | PRN

## 2015-10-15 MED ORDER — FENTANYL CITRATE (PF) 100 MCG/2ML IJ SOLN
25.0000 ug | INTRAMUSCULAR | Status: DC | PRN
Start: 2015-10-15 — End: 2015-10-15
  Administered 2015-10-15 (×4): 25 ug via INTRAVENOUS

## 2015-10-15 MED ORDER — EPHEDRINE SULFATE 50 MG/ML IJ SOLN
INTRAMUSCULAR | Status: DC | PRN
  Administered 2015-10-15 (×2): 5 mg via INTRAVENOUS

## 2015-10-15 MED ORDER — FENTANYL CITRATE (PF) 100 MCG/2ML IJ SOLN
INTRAMUSCULAR | Status: AC
  Administered 2015-10-15: 25 ug via INTRAVENOUS
  Filled 2015-10-15: qty 2

## 2015-10-15 MED ORDER — ROCURONIUM BROMIDE 100 MG/10ML IV SOLN
INTRAVENOUS | Status: DC | PRN
  Administered 2015-10-15: 10 mg via INTRAVENOUS
  Administered 2015-10-15: 5 mg via INTRAVENOUS

## 2015-10-15 MED ORDER — LACTATED RINGERS IV SOLN
INTRAVENOUS | Status: DC
Start: 2015-10-15 — End: 2015-10-15
  Administered 2015-10-15: 15:00:00 via INTRAVENOUS

## 2015-10-15 MED ORDER — MIDAZOLAM HCL 5 MG/5ML IJ SOLN
INTRAMUSCULAR | Status: DC | PRN
  Administered 2015-10-15: 2 mg via INTRAVENOUS

## 2015-10-15 MED ORDER — GLYCOPYRROLATE 0.2 MG/ML IJ SOLN
INTRAMUSCULAR | Status: DC | PRN
  Administered 2015-10-15: 0.2 mg via INTRAVENOUS

## 2015-10-15 SURGICAL SUPPLY — 27 items
BLADE SURG 15 STRL LF DISP TIS (BLADE) ×2 IMPLANT
BLADE SURG 15 STRL SS (BLADE) ×3
CANISTER SUCT 1200ML W/VALVE (MISCELLANEOUS) ×3 IMPLANT
DRAPE LAPAROTOMY 100X77 ABD (DRAPES) ×3 IMPLANT
DRSG MEPITEL 4X7.2 (GAUZE/BANDAGES/DRESSINGS) ×3 IMPLANT
DRSG VAC ATS MED SENSATRAC (GAUZE/BANDAGES/DRESSINGS) ×3 IMPLANT
ELECT CAUTERY NEEDLE TIP 1.0 (MISCELLANEOUS) ×3
ELECT REM PT RETURN 9FT ADLT (ELECTROSURGICAL) ×3
ELECTRODE CAUTERY NEDL TIP 1.0 (MISCELLANEOUS) ×2 IMPLANT
ELECTRODE REM PT RTRN 9FT ADLT (ELECTROSURGICAL) ×2 IMPLANT
GAUZE SPONGE 4X4 12PLY STRL (GAUZE/BANDAGES/DRESSINGS) ×3 IMPLANT
GLOVE BIO SURGEON STRL SZ7.5 (GLOVE) ×3 IMPLANT
GLOVE INDICATOR 8.0 STRL GRN (GLOVE) ×3 IMPLANT
GOWN STRL REUS W/ TWL LRG LVL3 (GOWN DISPOSABLE) ×4 IMPLANT
GOWN STRL REUS W/TWL LRG LVL3 (GOWN DISPOSABLE) ×6
KIT DRSG VAC SLVR GRANUFM (MISCELLANEOUS) ×1 IMPLANT
KIT RM TURNOVER STRD PROC AR (KITS) ×3 IMPLANT
LABEL OR SOLS (LABEL) ×3 IMPLANT
NDL HYPO 25X1 1.5 SAFETY (NEEDLE) ×2 IMPLANT
NEEDLE HYPO 25X1 1.5 SAFETY (NEEDLE) ×3 IMPLANT
NS IRRIG 500ML POUR BTL (IV SOLUTION) ×3 IMPLANT
PACK BASIN MINOR ARMC (MISCELLANEOUS) ×3 IMPLANT
SOL PREP PVP 2OZ (MISCELLANEOUS) ×3
SOLUTION PREP PVP 2OZ (MISCELLANEOUS) ×2 IMPLANT
SYR BULB EAR ULCER 3OZ GRN STR (SYRINGE) ×3 IMPLANT
SYRINGE 10CC LL (SYRINGE) ×3 IMPLANT
WND VAC CANISTER 500ML (MISCELLANEOUS) ×3 IMPLANT

## 2015-10-15 NOTE — Plan of Care (Signed)
Problem: Safety: Goal: Ability to remain free from injury will improve Outcome: Progressing Pt has remained free from falls during my care. Pt calls appropriately for assistance when needing to go to bathroom. Call bell within reach  Problem: Pain Managment: Goal: General experience of comfort will improve Outcome: Progressing Pt has not complained of pain during my care. She does admit that while lying still, the pain is little to none and increases with movement.   Problem: Skin Integrity: Goal: Risk for impaired skin integrity will decrease Outcome: Not Progressing Pt has open wound on left thigh that has a moderate amount of drainage during my shift. Surgery scheduled today for I&D of wound.   Problem: Activity: Goal: Risk for activity intolerance will decrease Outcome: Not Progressing Pt able to walk with stand-by assistance during my shift.

## 2015-10-15 NOTE — Op Note (Signed)
10/14/2015 - 10/15/2015  4:27 PM  PATIENT:  Barbara Hawkins  40 y.o. female  PRE-OPERATIVE DIAGNOSIS:  necrotic thigh wound  POST-OPERATIVE DIAGNOSIS:  necrotic thigh wound  PROCEDURE:  Procedure(s): IRRIGATION AND DEBRIDEMENT LEG ABSCESS (Left) APPLICATION OF WOUND VAC (Left)  SURGEON:  Surgeon(s) and Role:    * Tiney Rougealph Ely III, MD - Primary   ASSISTANTS: none   ANESTHESIA:   general  EBL:  Total I/O In: 1344 [I.V.:1144; IV Piggyback:200] Out: 200 [Urine:200]   DRAINS: none   LOCAL MEDICATIONS USED:  NONE   DISPOSITION OF SPECIMEN:  N/A   DICTATION: .Dragon Dictation  With the patient in supine position and after induction of appropriate general anesthesia the patient's was placed in the prone position. Right was prepped with Betadine and draped sterile towels. The wound was examined. There was significant improvement from last evening when I saw her for the first time. My fear is that the wound VAC was inadequately covering the amount of area in the defect. We removed some of the necrotic tissue sharply using a knife. No muscle was involved. No more skin was removed but a large amount of fatty material was removed. The wound was then copiously irrigated. White foam was placed in the depths in the undermined area and then gray foam on top. Wound vacs place in standard fashion. The patient was awakened and returned recovery room in satisfactory condition. Sponge instrument needle count were correct 2 in the operating room.  PLAN OF CARE: Admit to inpatient   PATIENT DISPOSITION:  PACU - hemodynamically stable.   Tiney Rougealph Ely III, MD

## 2015-10-15 NOTE — Anesthesia Preprocedure Evaluation (Addendum)
Anesthesia Evaluation  Patient identified by MRN, date of birth, ID band Patient awake    Reviewed: Allergy & Precautions, NPO status , Patient's Chart, lab work & pertinent test results  History of Anesthesia Complications Negative for: history of anesthetic complications  Airway Mallampati: II  TM Distance: >3 FB Neck ROM: Full    Dental  (+) Poor Dentition, Chipped   Pulmonary neg pulmonary ROS, neg sleep apnea, neg COPD,    breath sounds clear to auscultation- rhonchi (-) wheezing      Cardiovascular Exercise Tolerance: Good hypertension (not on medication), (-) CAD and (-) Past MI  Rhythm:Regular Rate:Normal - Systolic murmurs and - Diastolic murmurs    Neuro/Psych negative neurological ROS  negative psych ROS   GI/Hepatic Neg liver ROS, GERD  Medicated,  Endo/Other  neg diabetesMorbid obesity  Renal/GU negative Renal ROS     Musculoskeletal Ehlers danlos    Abdominal (+) + obese,   Peds  Hematology negative hematology ROS (+)   Anesthesia Other Findings   Reproductive/Obstetrics                             Anesthesia Physical  Anesthesia Plan  ASA: II  Anesthesia Plan: General   Post-op Pain Management:    Induction: Intravenous  Airway Management Planned: Oral ETT  Additional Equipment:   Intra-op Plan:   Post-operative Plan: Extubation in OR  Informed Consent: I have reviewed the patients History and Physical, chart, labs and discussed the procedure including the risks, benefits and alternatives for the proposed anesthesia with the patient or authorized representative who has indicated his/her understanding and acceptance.   Dental advisory given  Plan Discussed with: CRNA and Anesthesiologist  Anesthesia Plan Comments:         Anesthesia Quick Evaluation  

## 2015-10-15 NOTE — Transfer of Care (Signed)
Immediate Anesthesia Transfer of Care Note  Patient: Barbara Hawkins  Procedure(s) Performed: Procedure(s): IRRIGATION AND DEBRIDEMENT LEG ABSCESS (Left) APPLICATION OF WOUND VAC (Left)  Patient Location: PACU  Anesthesia Type:General  Level of Consciousness: awake and patient cooperative  Airway & Oxygen Therapy: Patient Spontanous Breathing and Patient connected to face mask oxygen  Post-op Assessment: Report given to RN and Post -op Vital signs reviewed and stable  Post vital signs: Reviewed and stable  Last Vitals:  Vitals:   10/15/15 1426 10/15/15 1638  BP: 124/69 102/64  Pulse: 69 91  Resp: 18 13  Temp: 36.3 C 36.3 C    Last Pain:  Vitals:   10/15/15 1426  TempSrc: Tympanic  PainSc: 2          Complications: No apparent anesthesia complications

## 2015-10-15 NOTE — Progress Notes (Signed)
Subjective:   She has done reasonably well overnight. Her white blood cell count is normal. Her pain is under reasonable control. She is very upset.  Vital signs in last 24 hours: Temp:  [97.8 F (36.6 C)-98.8 F (37.1 C)] 98 F (36.7 C) (09/22 0619) Pulse Rate:  [66-98] 66 (09/22 0619) Resp:  [18-20] 18 (09/22 0619) BP: (98-132)/(47-84) 98/47 (09/22 0619) SpO2:  [98 %-100 %] 100 % (09/22 0619) Weight:  [134.5 kg (296 lb 8 oz)-134.5 kg (296 lb 8.3 oz)] 134.5 kg (296 lb 8.3 oz) (09/21 1918) Last BM Date: 10/14/15  Intake/Output from previous day: 09/21 0701 - 09/22 0700 In: 666 [P.O.:120; I.V.:275; IV Piggyback:271] Out: 300 [Urine:300]  Exam:  The wound was not examined today.  Lab Results:  CBC  Recent Labs  10/14/15 1922  WBC 9.8  HGB 12.6  HCT 38.4  PLT 232   CMP     Component Value Date/Time   NA 137 10/14/2015 1922   NA 140 01/30/2014 1513   K 3.3 (L) 10/14/2015 1922   K 4.2 01/30/2014 1513   CL 108 10/14/2015 1922   CL 107 01/30/2014 1513   CO2 20 (L) 10/14/2015 1922   CO2 27 01/30/2014 1513   GLUCOSE 90 10/14/2015 1922   GLUCOSE 98 01/30/2014 1513   BUN 11 10/14/2015 1922   BUN 5 (L) 01/30/2014 1513   CREATININE 0.71 10/14/2015 1922   CREATININE 0.77 01/30/2014 1513   CALCIUM 8.8 (L) 10/14/2015 1922   CALCIUM 8.9 01/30/2014 1513   PROT 7.8 10/14/2015 1922   PROT 7.3 01/30/2014 1513   ALBUMIN 3.9 10/14/2015 1922   ALBUMIN 3.6 01/30/2014 1513   AST 19 10/14/2015 1922   AST 24 01/30/2014 1513   ALT 15 10/14/2015 1922   ALT 28 01/30/2014 1513   ALKPHOS 97 10/14/2015 1922   ALKPHOS 96 01/30/2014 1513   BILITOT 0.5 10/14/2015 1922   BILITOT 0.5 01/30/2014 1513   GFRNONAA >60 10/14/2015 1922   GFRNONAA >60 01/30/2014 1513   GFRNONAA >60 02/27/2013 1736   GFRAA >60 10/14/2015 1922   GFRAA >60 01/30/2014 1513   GFRAA >60 02/27/2013 1736   PT/INR No results for input(s): LABPROT, INR in the last 72 hours.  Studies/Results: No results  found.  Assessment/Plan: We will plan surgery later this morning for a more aggressive debridement. However she does not appear to have any improvement with recurrent surgery we may want to consider outside referral for possible plastic surgery reconstruction.

## 2015-10-15 NOTE — Anesthesia Procedure Notes (Signed)
Procedure Name: Intubation Date/Time: 10/15/2015 3:44 PM Performed by: Lily KocherPERALTA, Kelseigh Diver Pre-anesthesia Checklist: Patient identified, Patient being monitored, Timeout performed, Emergency Drugs available and Suction available Patient Re-evaluated:Patient Re-evaluated prior to inductionOxygen Delivery Method: Circle system utilized Preoxygenation: Pre-oxygenation with 100% oxygen Intubation Type: IV induction Ventilation: Mask ventilation without difficulty Laryngoscope Size: Mac and 3 Grade View: Grade I Tube type: Oral Tube size: 7.0 mm Number of attempts: 1 Airway Equipment and Method: Stylet Placement Confirmation: ETT inserted through vocal cords under direct vision,  positive ETCO2 and breath sounds checked- equal and bilateral Secured at: 21 cm Tube secured with: Tape Dental Injury: Teeth and Oropharynx as per pre-operative assessment

## 2015-10-15 NOTE — Anesthesia Postprocedure Evaluation (Addendum)
Anesthesia Post Note  Patient: Barbara Hawkins  Procedure(s) Performed: Procedure(s) (LRB): IRRIGATION AND DEBRIDEMENT LEG ABSCESS (Left) APPLICATION OF WOUND VAC (Left)  Patient location during evaluation: PACU Anesthesia Type: General Level of consciousness: awake and alert and oriented Pain management: pain level controlled Vital Signs Assessment: post-procedure vital signs reviewed and stable Respiratory status: spontaneous breathing Cardiovascular status: blood pressure returned to baseline Anesthetic complications: no Comments: Patient had a small amount of bleeding on the left corner of her mouth which resolved quickly with some pressure.  The patient states that she has a hx of  a broken/ missing premolar on the upper left side of her mouth which causes her to bite her lips/tongue on occasion.    Last Vitals:  Vitals:   10/15/15 1654 10/15/15 1659  BP:    Pulse: 87 70  Resp: 12 12  Temp:      Last Pain:  Vitals:   10/15/15 1659  TempSrc:   PainSc: 6                  Marica Trentham

## 2015-10-16 LAB — CREATININE, SERUM: CREATININE: 0.82 mg/dL (ref 0.44–1.00)

## 2015-10-16 LAB — VANCOMYCIN, TROUGH
VANCOMYCIN TR: 25 ug/mL — AB (ref 15–20)
VANCOMYCIN TR: 12 ug/mL — AB (ref 15–20)

## 2015-10-16 MED ORDER — VANCOMYCIN HCL IN DEXTROSE 1-5 GM/200ML-% IV SOLN
1000.0000 mg | Freq: Two times a day (BID) | INTRAVENOUS | Status: DC
  Administered 2015-10-16: 1000 mg via INTRAVENOUS
  Filled 2015-10-16 (×3): qty 200

## 2015-10-16 MED ORDER — VANCOMYCIN HCL 10 G IV SOLR
1250.0000 mg | Freq: Two times a day (BID) | INTRAVENOUS | Status: DC
  Administered 2015-10-16: 1250 mg via INTRAVENOUS
  Filled 2015-10-16 (×2): qty 1250

## 2015-10-16 NOTE — Progress Notes (Signed)
Pharmacy Antibiotic Note  Barbara Hawkins is a 40 y.o. female admitted on 10/14/2015 with open thigh wound.  Pharmacy has been consulted for vancomycin dosing. Patient is also on Zosyn.   Plan: Vancomycin 1000 IV every 8 hours.  Goal trough 15-20 mcg/mL. Trough with the 5th dose.  Zosyn 4.5 g EI q 8 hours.  Height: 5\' 6"  (167.6 cm) Weight: 296 lb 8.3 oz (134.5 kg) IBW/kg (Calculated) : 59.3  Temp (24hrs), Avg:97.7 F (36.5 C), Min:97.3 F (36.3 C), Max:98.2 F (36.8 C)   Recent Labs Lab 10/14/15 1922 10/16/15 0332  WBC 9.8  --   CREATININE 0.71 0.82  VANCOTROUGH  --  25*    Estimated Creatinine Clearance: 128.7 mL/min (by C-G formula based on SCr of 0.82 mg/dL).    Allergies  Allergen Reactions  . Morphine Hives  . Morphine And Related Itching and Swelling  . Septra [Sulfamethoxazole-Trimethoprim] Hives and Swelling  . Sulfamethoxazole-Trimethoprim Hives    Antimicrobials this admission: Vancomycin  9/21 >>  Zosyn 9/21 >>   Dose adjustments this admission: 9/22 04:00 vanc level 25. Changed to 1 gram q 12 hours. Level before 3rd new dose to evaluate clearance.   Microbiology results: 9/21 BCx: pending  Thank you for allowing pharmacy to be a part of this patient's care.  Vondell Babers S 10/16/2015 4:28 AM

## 2015-10-16 NOTE — Progress Notes (Signed)
10/16/2015  Subjective: Pt is postop day 1 from left lower extremity wound washout and debridement. No acute events overnight. She reports her pain is improving. Denies any fevers chills chest pain or shortness of breath  Vital signs: Temp:  [97.3 F (36.3 C)-98.2 F (36.8 C)] 98.2 F (36.8 C) (09/23 0454) Pulse Rate:  [65-91] 81 (09/23 0454) Resp:  [12-20] 20 (09/23 0454) BP: (93-124)/(47-76) 111/66 (09/23 0454) SpO2:  [96 %-100 %] 100 % (09/23 0454)   Intake/Output: 09/22 0701 - 09/23 0700 In: 2144 [P.O.:420; I.V.:1294; IV Piggyback:430] Out: 1660 [Urine:1650; Blood:10] Last BM Date: 10/14/15  Physical Exam: Constitutional: No acute distress Cardiac:  Regular rhythm and rate Pulm: No respiratory distress and lungs are clear Skin: Left lower extremity wound currently with wound VAC in place with good seal. There is no surrounding erythema or fluctuance  Labs:   Recent Labs  10/14/15 1922  WBC 9.8  HGB 12.6  HCT 38.4  PLT 232    Recent Labs  10/14/15 1922 10/16/15 0332  NA 137  --   K 3.3*  --   CL 108  --   CO2 20*  --   GLUCOSE 90  --   BUN 11  --   CREATININE 0.71 0.82  CALCIUM 8.8*  --    No results for input(s): LABPROT, INR in the last 72 hours.  Imaging: No results found.  Assessment/Plan: 40 year old female now status post left lower extremity wound washout and debridement  -We'll continue wound VAC dressing. This will be changed on Monday to reevaluate the wound. -Continue IV antibiotics today likely switch to oral antibiotics tomorrow. -Regular diet and we will discontinue IV fluids   Howie IllJose Luis Bentli Llorente, MD [facility]

## 2015-10-16 NOTE — Progress Notes (Signed)
Pharmacy Antibiotic Note  Barbara Hawkins is a 40 y.o. female admitted on 10/14/2015 with open thigh wound.  Pharmacy has been consulted for vancomycin dosing. Patient is also on Zosyn.   Plan: Vancomycin 1000 IV every 8 hours.  Goal trough 15-20 mcg/mL. Trough with the 5th dose.  Zosyn 4.5 g EI q 8 hours.  Height: 5\' 6"  (167.6 cm) Weight: 296 lb 8.3 oz (134.5 kg) IBW/kg (Calculated) : 59.3  Temp (24hrs), Avg:98 F (36.7 C), Min:97.4 F (36.3 C), Max:98.2 F (36.8 C)   Recent Labs Lab 10/14/15 1922 10/16/15 0332 10/16/15 1946  WBC 9.8  --   --   CREATININE 0.71 0.82  --   VANCOTROUGH  --  25* 12*    Estimated Creatinine Clearance: 128.7 mL/min (by C-G formula based on SCr of 0.82 mg/dL).    Allergies  Allergen Reactions  . Morphine Hives  . Morphine And Related Itching and Swelling  . Septra [Sulfamethoxazole-Trimethoprim] Hives and Swelling  . Sulfamethoxazole-Trimethoprim Hives    Antimicrobials this admission: Vancomycin  9/21 >>  Zosyn 9/21 >>   Dose adjustments this admission: 9/22 04:00 vanc level 25. Changed to 1 gram q 12 hours. Level before 3rd new dose to evaluate clearance.  9/23 1946 vanc trough 12 mcg/mL. Increase to vancomycin 1.25 gm IV Q12H, pharmacy will continue to follow and adjust.   Microbiology results: 9/21 BCx: pending  Thank you for allowing pharmacy to be a part of this patient's care.  Carola FrostNathan A Batoul Limes, Pharm.D., BCPS Clinical Pharmacist 10/16/2015 8:24 PM

## 2015-10-17 MED ORDER — CIPROFLOXACIN HCL 500 MG PO TABS
500.0000 mg | ORAL_TABLET | Freq: Two times a day (BID) | ORAL | Status: DC
  Administered 2015-10-17 – 2015-10-18 (×3): 500 mg via ORAL
  Filled 2015-10-17 (×3): qty 1

## 2015-10-17 NOTE — Progress Notes (Signed)
10/17/2015  Subjective: Patient is 2 Days Post-Op status post debridement and washout of left posterior lower extremity wound. There are no acute events overnight. Patient has been tolerating a diet with no problems reports her pain is mildly improving. The wound VAC has continued to suction without any evidence of leaks.  Vital signs: Temp:  [98 F (36.7 C)-98.2 F (36.8 C)] 98.2 F (36.8 C) (09/24 1200) Pulse Rate:  [69-76] 72 (09/24 1200) Resp:  [18-20] 19 (09/24 1200) BP: (114-130)/(63-85) 121/73 (09/24 1200) SpO2:  [99 %-100 %] 99 % (09/24 1200)   Intake/Output: 09/23 0701 - 09/24 0700 In: 1115 [P.O.:460; IV Piggyback:655] Out: 1850 [Urine:1650; Drains:200] Last BM Date: 10/14/15  Physical Exam: Constitutional: No acute distress Cardiac:  Regular rhythm and rate Pulm: Lungs are clear bilaterally with no respiratory distress Abdomen: Abdomen is soft nontender nondistended. Skin: Left lower extremity wound without any surrounding erythema and much softer around the wound itself. Wound VAC is to suction with appropriate seal  Labs:   Recent Labs  10/14/15 1922  WBC 9.8  HGB 12.6  HCT 38.4  PLT 232    Recent Labs  10/14/15 1922 10/16/15 0332  NA 137  --   K 3.3*  --   CL 108  --   CO2 20*  --   GLUCOSE 90  --   BUN 11  --   CREATININE 0.71 0.82  CALCIUM 8.8*  --    No results for input(s): LABPROT, INR in the last 72 hours.  Imaging: No results found.  Assessment/Plan: This is a 40 year old female status post extremity wound washout and debridement now postop day 2.  -Will stop IV antibiotics and transitioned the patient to oral antibiotics. Looking at the previous wound cultures it appears the wound had previously grown Pseudomonas and Klebsiella which are both sensitive to Cipro. -Next wound VAC change will be tomorrow 9/25. Depending on how the wound looks at this point the patient may potentially be discharged tomorrow with home health services again  for continued wound VAC changes.   Howie IllJose Luis Kimara Bencomo, MD [facility]

## 2015-10-18 MED ORDER — OXYCODONE HCL 5 MG PO TABS: 5.0000 mg | ORAL_TABLET | ORAL | 0 refills | Status: DC | PRN

## 2015-10-18 MED ORDER — CIPROFLOXACIN HCL 500 MG PO TABS: 500.0000 mg | ORAL_TABLET | Freq: Two times a day (BID) | ORAL | 0 refills | Status: DC

## 2015-10-18 NOTE — Discharge Summary (Signed)
Patient ID: Barbara Hawkins MRN: [id number] DOB/AGE: 40-Sep-1977 40 y.o.  Admit date: 10/14/2015 Discharge date: 10/18/2015   Discharge Diagnoses:  Active Problems:   Open thigh wound   Procedures:I / D posterior left thigh w Wound vac placement  Hospital Course: Barbara Hawkins is a 40 year old female with a history of Ehlers-Danlos disease at is post I&D of bilateral left abscess and necrotic wound by Dr. Evette CristalSankar on 09/26/2015. Fortunately may have failed outpatient management and came to the emergency room with worsening of the wound. At that time he was found to have a larger necrotic wound in need for debridement. Dr. Michela PitcherEly from our group did an I&D and has significant debridement of the left thigh performed on September 22 I wound VAC was placed. And she did grow multiple organisms including pseudomonas that was sensitive to ciprofloxacin. She was changed from IV antibiotics to by mouth ciprofloxacin with good response. She continued to do well clinically at the time of discharge she was ambulating, tolerating regular diet she was afebrile and her vital signs were stable. Her physical exam reveals a female in no acute distress awake alert her abdomen was soft nontender her left wound was examined with the wound care nurse with good evidence of granulation tissue and no undrained pockets. No evidence of necrotizing infection with significant improvement of the wound. At the patient already had an advanced wound care system and therefore this is not compatible with our KCI system. We have made arrangements for her to go home with a wet-to-dry in the interim until advanced wound VAC systems are available at home. She will also be sent home with by mouth ciprofloxacin . Condition of the patient at time of discharge is stable  Disposition: 01-Home or Self Care  Discharge Instructions    Call MD for:  difficulty breathing, headache or visual disturbances    Complete by:  As directed    Call MD for:  extreme  fatigue    Complete by:  As directed    Call MD for:  hives    Complete by:  As directed    Call MD for:  persistant dizziness or light-headedness    Complete by:  As directed    Call MD for:  persistant nausea and vomiting    Complete by:  As directed    Call MD for:  redness, tenderness, or signs of infection (pain, swelling, redness, odor or green/yellow discharge around incision site)    Complete by:  As directed    Call MD for:  severe uncontrolled pain    Complete by:  As directed    Call MD for:  temperature >100.4    Complete by:  As directed    Diet - low sodium heart healthy    Complete by:  As directed    Discharge instructions    Complete by:  As directed    Pt may go home w wet to dry to open wound thigh until home wound vac is arranged   Discharge wound care:    Complete by:  As directed    Wound vac changes Three times per week, day of the week depends on when the patient can actually get her wound vac at home   Increase activity slowly    Complete by:  As directed        Medication List    TAKE these medications   aspirin EC 81 MG tablet Take 81 mg by mouth as needed for mild pain or  moderate pain.   ciprofloxacin 500 MG tablet Commonly known as:  CIPRO Take 1 tablet (500 mg total) by mouth 2 (two) times daily.   ibuprofen 800 MG tablet Commonly known as:  ADVIL,MOTRIN Take 1 tablet (800 mg total) by mouth every 8 (eight) hours as needed for moderate pain.   multivitamin tablet Take 1 tablet by mouth daily.   omeprazole 20 MG capsule Commonly known as:  PRILOSEC Take 20 mg by mouth daily.   oxyCODONE 5 MG immediate release tablet Commonly known as:  Oxy IR/ROXICODONE Take 1-2 tablets (5-10 mg total) by mouth every 3 (three) hours as needed for severe pain or breakthrough pain.      Follow-up Information    Henrene Dodge, MD Follow up on 11/03/2015.   Specialty:  Surgery Why:  Wednesday, 9/15 at 9:15 a.m., [facility] office Contact  information: [address]  [address] [place] [address] [phone]            Sterling Big, MD FACS

## 2015-10-18 NOTE — Consult Note (Signed)
WOC Nurse wound consult note Reason for Consult:LEft posterior lower leg surgical debridement of necrotic tissue site Wound type: Necrotic wound with surgical debridement Pressure Ulcer POA: N/A Measurement: 4 cm x 10.4 cm x 6 cm  Wound VQQ:VZDGLOVbed:Adipose, pink nongranulating tissue Drainage (amount, consistency, odor) Moderate serosanguinous  No odor.  Periwound:Intact Dressing procedure/placement/frequency: Premedicate for pain.  Dr Everlene FarrierPabon at bedside. Cleanse wound to left posterior leg with NS.  Skin prep to peri wound . Fill wound depth with black granufoam (1 piece used today). Will no longer use white foam.   Cover with drape.  Seal is immediately achieved at 125.  Change Mon/Wed/Fri.  WOC team will follow and remain available to patient, medical and nursing teams.  Maple HudsonKaren Joyous Gleghorn RN BSN CWON Pager [phone]

## 2015-10-18 NOTE — Care Management (Signed)
Patient admitted post I&D left thigh abscess.  Patient previously discharged with wound vac.  Patient to discharge with resumption of home health care for RN.  Barbara CowerJason with [facility] notified.  Home wound vac at bedside.  Patient to discharge with wet to dry dressing, and take home wound vac at discharge.  [facility] RN to apply home wound vac this admission.  RNCM signing off.

## 2015-10-18 NOTE — Progress Notes (Signed)
Wound vac removed and wet to dry dressing applied prior to discharge per MD order.  Discharge instructions reviewed with patient.  Understanding was verbalized and all questions were answered.  Patient discharged home via wheelchair in stable condition escorted by nursing staff.

## 2015-10-19 ENCOUNTER — Encounter: Payer: Self-pay | Admitting: Surgery

## 2015-10-19 LAB — CULTURE, BLOOD (ROUTINE X 2): CULTURE: NO GROWTH

## 2015-10-22 ENCOUNTER — Other Ambulatory Visit: Payer: Self-pay

## 2015-10-22 ENCOUNTER — Inpatient Hospital Stay: Payer: Self-pay | Admitting: Registered Nurse

## 2015-10-22 ENCOUNTER — Inpatient Hospital Stay
Admission: EM | Admit: 2015-10-22 | Discharge: 2015-11-05 | DRG: 329 | Disposition: A | Discharge status: Home / Self Care | Payer: Self-pay | Attending: General Surgery | Admitting: General Surgery

## 2015-10-22 ENCOUNTER — Encounter: Payer: Self-pay | Admitting: Medical Oncology

## 2015-10-22 ENCOUNTER — Encounter
Admission: EM | Disposition: A | Discharge status: Home / Self Care | Payer: Self-pay | Source: Home / Self Care | Attending: General Surgery

## 2015-10-22 ENCOUNTER — Emergency Department: Payer: Self-pay

## 2015-10-22 DIAGNOSIS — K631 Perforation of intestine (nontraumatic): Principal | ICD-10-CM | POA: Diagnosis present

## 2015-10-22 DIAGNOSIS — Z452 Encounter for adjustment and management of vascular access device: Secondary | ICD-10-CM

## 2015-10-22 DIAGNOSIS — Z882 Allergy status to sulfonamides status: Secondary | ICD-10-CM

## 2015-10-22 DIAGNOSIS — Z885 Allergy status to narcotic agent status: Secondary | ICD-10-CM

## 2015-10-22 DIAGNOSIS — Z8249 Family history of ischemic heart disease and other diseases of the circulatory system: Secondary | ICD-10-CM

## 2015-10-22 DIAGNOSIS — Z23 Encounter for immunization: Secondary | ICD-10-CM

## 2015-10-22 DIAGNOSIS — Z841 Family history of disorders of kidney and ureter: Secondary | ICD-10-CM

## 2015-10-22 DIAGNOSIS — K668 Other specified disorders of peritoneum: Secondary | ICD-10-CM

## 2015-10-22 DIAGNOSIS — K659 Peritonitis, unspecified: Secondary | ICD-10-CM | POA: Diagnosis present

## 2015-10-22 DIAGNOSIS — Z6841 Body Mass Index (BMI) 40.0 and over, adult: Secondary | ICD-10-CM

## 2015-10-22 DIAGNOSIS — R262 Difficulty in walking, not elsewhere classified: Secondary | ICD-10-CM

## 2015-10-22 DIAGNOSIS — Q796 Ehlers-Danlos syndrome: Secondary | ICD-10-CM

## 2015-10-22 DIAGNOSIS — M6281 Muscle weakness (generalized): Secondary | ICD-10-CM

## 2015-10-22 DIAGNOSIS — K5900 Constipation, unspecified: Secondary | ICD-10-CM | POA: Diagnosis present

## 2015-10-22 DIAGNOSIS — R1084 Generalized abdominal pain: Secondary | ICD-10-CM

## 2015-10-22 DIAGNOSIS — R198 Other specified symptoms and signs involving the digestive system and abdomen: Secondary | ICD-10-CM | POA: Diagnosis present

## 2015-10-22 DIAGNOSIS — Z79899 Other long term (current) drug therapy: Secondary | ICD-10-CM

## 2015-10-22 HISTORY — PX: LAPAROSCOPY: SHX197

## 2015-10-22 HISTORY — PX: LAPAROTOMY: SHX154

## 2015-10-22 HISTORY — DX: Systemic involvement of connective tissue, unspecified: M35.9

## 2015-10-22 HISTORY — PX: TRANSVERSE COLON RESECTION: SHX6155

## 2015-10-22 LAB — CBC
HCT: 39.5 % (ref 35.0–47.0)
Hemoglobin: 13.1 g/dL (ref 12.0–16.0)
MCH: 28.1 pg (ref 26.0–34.0)
MCHC: 33.1 g/dL (ref 32.0–36.0)
MCV: 85 fL (ref 80.0–100.0)
PLATELETS: 305 10*3/uL (ref 150–440)
RBC: 4.65 MIL/uL (ref 3.80–5.20)
RDW: 16.2 % — AB (ref 11.5–14.5)
WBC: 15.7 10*3/uL — ABNORMAL HIGH (ref 3.6–11.0)

## 2015-10-22 LAB — COMPREHENSIVE METABOLIC PANEL
ALBUMIN: 3.7 g/dL (ref 3.5–5.0)
ALT: 52 U/L (ref 14–54)
AST: 32 U/L (ref 15–41)
Alkaline Phosphatase: 91 U/L (ref 38–126)
Anion gap: 10 (ref 5–15)
BUN: 8 mg/dL (ref 6–20)
CHLORIDE: 105 mmol/L (ref 101–111)
CO2: 25 mmol/L (ref 22–32)
CREATININE: 0.76 mg/dL (ref 0.44–1.00)
Calcium: 9.2 mg/dL (ref 8.9–10.3)
GFR calc non Af Amer: >60 mL/min (ref >60)
GLUCOSE: 132 mg/dL — AB (ref 65–99)
Potassium: 3.5 mmol/L (ref 3.5–5.1)
SODIUM: 140 mmol/L (ref 135–145)
Total Bilirubin: 0.3 mg/dL (ref 0.3–1.2)
Total Protein: 7.7 g/dL (ref 6.5–8.1)

## 2015-10-22 LAB — URINALYSIS COMPLETE WITH MICROSCOPIC (ARMC ONLY)
BACTERIA UA: NONE SEEN
BILIRUBIN URINE: NEGATIVE
Glucose, UA: NEGATIVE mg/dL
Hgb urine dipstick: NEGATIVE
KETONES UR: NEGATIVE mg/dL
Leukocytes, UA: NEGATIVE
NITRITE: NEGATIVE
PH: 5 (ref 5.0–8.0)
PROTEIN: NEGATIVE mg/dL
Specific Gravity, Urine: 1.018 (ref 1.005–1.030)

## 2015-10-22 LAB — PROTIME-INR
INR: 0.96
Prothrombin Time: 12.8 seconds (ref 11.4–15.2)

## 2015-10-22 LAB — TYPE AND SCREEN
ABO/RH(D): B POS
ANTIBODY SCREEN: NEGATIVE

## 2015-10-22 LAB — LIPASE, BLOOD: LIPASE: 17 U/L (ref 11–51)

## 2015-10-22 LAB — PREGNANCY, URINE: Preg Test, Ur: NEGATIVE

## 2015-10-22 SURGERY — LAPAROSCOPY, DIAGNOSTIC
Anesthesia: General | Wound class: Clean

## 2015-10-22 MED ORDER — IOPAMIDOL (ISOVUE-300) INJECTION 61%
30.0000 mL | Freq: Once | INTRAVENOUS | Status: DC | PRN
  Filled 2015-10-22: qty 30

## 2015-10-22 MED ORDER — ONDANSETRON HCL 4 MG/2ML IJ SOLN
INTRAMUSCULAR | Status: AC
  Administered 2015-10-22: 4 mg via INTRAVENOUS
  Filled 2015-10-22: qty 2

## 2015-10-22 MED ORDER — ENOXAPARIN SODIUM 40 MG/0.4ML ~~LOC~~ SOLN
40.0000 mg | Freq: Two times a day (BID) | SUBCUTANEOUS | Status: DC
Start: 2015-10-23 — End: 2015-11-05
  Administered 2015-10-23 – 2015-11-05 (×26): 40 mg via SUBCUTANEOUS
  Filled 2015-10-22 (×26): qty 0.4

## 2015-10-22 MED ORDER — FENTANYL CITRATE (PF) 100 MCG/2ML IJ SOLN
100.0000 ug | Freq: Once | INTRAMUSCULAR | Status: AC
  Administered 2015-10-22: 100 ug via INTRAVENOUS

## 2015-10-22 MED ORDER — PROPOFOL 10 MG/ML IV BOLUS
INTRAVENOUS | Status: DC | PRN
  Administered 2015-10-22: 200 mg via INTRAVENOUS

## 2015-10-22 MED ORDER — LACTATED RINGERS IV SOLN
INTRAVENOUS | Status: DC | PRN
  Administered 2015-10-22 – 2015-10-23 (×2): via INTRAVENOUS

## 2015-10-22 MED ORDER — PIPERACILLIN-TAZOBACTAM 3.375 G IVPB
INTRAVENOUS | Status: AC
  Administered 2015-10-22: 3.375 g via INTRAVENOUS
  Filled 2015-10-22: qty 50

## 2015-10-22 MED ORDER — ONDANSETRON 4 MG PO TBDP: 4.0000 mg | ORAL_TABLET | Freq: Four times a day (QID) | ORAL | Status: DC | PRN

## 2015-10-22 MED ORDER — HYDRALAZINE HCL 20 MG/ML IJ SOLN: 10.0000 mg | INTRAMUSCULAR | Status: DC | PRN

## 2015-10-22 MED ORDER — GLYCOPYRROLATE 0.2 MG/ML IJ SOLN
INTRAMUSCULAR | Status: DC | PRN
  Administered 2015-10-22: 0.2 mg via INTRAVENOUS
  Administered 2015-10-23: 0.4 mg via INTRAVENOUS

## 2015-10-22 MED ORDER — FENTANYL CITRATE (PF) 100 MCG/2ML IJ SOLN
INTRAMUSCULAR | Status: DC | PRN
  Administered 2015-10-22: 50 ug via INTRAVENOUS
  Administered 2015-10-22: 100 ug via INTRAVENOUS
  Administered 2015-10-22 – 2015-10-23 (×3): 50 ug via INTRAVENOUS

## 2015-10-22 MED ORDER — DIPHENHYDRAMINE HCL 25 MG PO CAPS: 25.0000 mg | ORAL_CAPSULE | Freq: Four times a day (QID) | ORAL | Status: DC | PRN

## 2015-10-22 MED ORDER — PIPERACILLIN-TAZOBACTAM 3.375 G IVPB
3.3750 g | Freq: Three times a day (TID) | INTRAVENOUS | Status: DC
  Administered 2015-10-23 – 2015-10-31 (×24): 3.375 g via INTRAVENOUS
  Filled 2015-10-22 (×25): qty 50

## 2015-10-22 MED ORDER — ONDANSETRON HCL 4 MG/2ML IJ SOLN: 4.0000 mg | Freq: Four times a day (QID) | INTRAMUSCULAR | Status: DC | PRN

## 2015-10-22 MED ORDER — LIDOCAINE HCL (PF) 1 % IJ SOLN
INTRAMUSCULAR | Status: AC
  Filled 2015-10-22: qty 30

## 2015-10-22 MED ORDER — INFLUENZA VAC SPLIT QUAD 0.5 ML IM SUSY
0.5000 mL | PREFILLED_SYRINGE | INTRAMUSCULAR | Status: AC
  Administered 2015-10-28: 0.5 mL via INTRAMUSCULAR
  Filled 2015-10-22: qty 0.5

## 2015-10-22 MED ORDER — LIDOCAINE HCL (CARDIAC) 20 MG/ML IV SOLN
INTRAVENOUS | Status: DC | PRN
  Administered 2015-10-22: 100 mg via INTRAVENOUS

## 2015-10-22 MED ORDER — DEXAMETHASONE SODIUM PHOSPHATE 10 MG/ML IJ SOLN
INTRAMUSCULAR | Status: DC | PRN
  Administered 2015-10-22: 10 mg via INTRAVENOUS

## 2015-10-22 MED ORDER — ROCURONIUM BROMIDE 100 MG/10ML IV SOLN
INTRAVENOUS | Status: DC | PRN
  Administered 2015-10-22 (×2): 50 mg via INTRAVENOUS

## 2015-10-22 MED ORDER — PHENYLEPHRINE HCL 10 MG/ML IJ SOLN
INTRAMUSCULAR | Status: DC | PRN
  Administered 2015-10-22 – 2015-10-23 (×3): 100 ug via INTRAVENOUS

## 2015-10-22 MED ORDER — FENTANYL CITRATE (PF) 100 MCG/2ML IJ SOLN
INTRAMUSCULAR | Status: AC
  Administered 2015-10-22: 100 ug via INTRAVENOUS
  Filled 2015-10-22: qty 2

## 2015-10-22 MED ORDER — BUPIVACAINE-EPINEPHRINE (PF) 0.25% -1:200000 IJ SOLN
INTRAMUSCULAR | Status: AC
  Filled 2015-10-22: qty 30

## 2015-10-22 MED ORDER — SODIUM CHLORIDE 0.9 % IV BOLUS (SEPSIS)
1000.0000 mL | Freq: Once | INTRAVENOUS | Status: AC
  Administered 2015-10-22: 1000 mL via INTRAVENOUS

## 2015-10-22 MED ORDER — LACTATED RINGERS IV SOLN
INTRAVENOUS | Status: DC
  Administered 2015-10-23 (×2): via INTRAVENOUS

## 2015-10-22 MED ORDER — DIPHENHYDRAMINE HCL 50 MG/ML IJ SOLN: 25.0000 mg | Freq: Four times a day (QID) | INTRAMUSCULAR | Status: DC | PRN

## 2015-10-22 MED ORDER — IOPAMIDOL (ISOVUE-300) INJECTION 61%
100.0000 mL | Freq: Once | INTRAVENOUS | Status: DC | PRN
  Filled 2015-10-22: qty 100

## 2015-10-22 MED ORDER — IOPAMIDOL (ISOVUE-300) INJECTION 61%
125.0000 mL | Freq: Once | INTRAVENOUS | Status: AC | PRN
  Administered 2015-10-22: 125 mL via INTRAVENOUS
  Filled 2015-10-22: qty 150

## 2015-10-22 MED ORDER — HYDROMORPHONE HCL 1 MG/ML IJ SOLN
1.0000 mg | INTRAMUSCULAR | Status: DC | PRN
  Filled 2015-10-22: qty 1

## 2015-10-22 MED ORDER — PIPERACILLIN-TAZOBACTAM 3.375 G IVPB 30 MIN
3.3750 g | Freq: Once | INTRAVENOUS | Status: AC
  Administered 2015-10-22: 3.375 g via INTRAVENOUS

## 2015-10-22 MED ORDER — MIDAZOLAM HCL 2 MG/2ML IJ SOLN
INTRAMUSCULAR | Status: DC | PRN
  Administered 2015-10-22: 2 mg via INTRAVENOUS

## 2015-10-22 MED ORDER — ONDANSETRON HCL 4 MG/2ML IJ SOLN
4.0000 mg | Freq: Once | INTRAMUSCULAR | Status: AC
  Administered 2015-10-22: 4 mg via INTRAVENOUS

## 2015-10-22 SURGICAL SUPPLY — 46 items
BLADE SURG SZ11 CARB STEEL (BLADE) ×3 IMPLANT
BULB RESERV EVAC DRAIN JP 100C (MISCELLANEOUS) ×2 IMPLANT
CANISTER SUCT 1200ML W/VALVE (MISCELLANEOUS) ×3 IMPLANT
CATH TRAY 16F METER LATEX (MISCELLANEOUS) ×3 IMPLANT
CHLORAPREP W/TINT 26ML (MISCELLANEOUS) ×3 IMPLANT
DRAIN CHANNEL JP 19F (MISCELLANEOUS) ×2 IMPLANT
DRAIN PENROSE 5/8X18 LTX STRL (WOUND CARE) ×1 IMPLANT
ELECT BLADE 6.5 EXT (BLADE) ×1 IMPLANT
ELECT CAUTERY BLADE 6.4 (BLADE) ×2 IMPLANT
ELECT REM PT RETURN 9FT ADLT (ELECTROSURGICAL) ×3
ELECTRODE REM PT RTRN 9FT ADLT (ELECTROSURGICAL) ×2 IMPLANT
GAUZE SPONGE 4X4 12PLY STRL (GAUZE/BANDAGES/DRESSINGS) ×2 IMPLANT
GLOVE BIO SURGEON STRL SZ7.5 (GLOVE) ×8 IMPLANT
GLOVE INDICATOR 8.0 STRL GRN (GLOVE) ×3 IMPLANT
HANDLE YANKAUER SUCT BULB TIP (MISCELLANEOUS) ×1 IMPLANT
IRRIGATION STRYKERFLOW (MISCELLANEOUS) ×2 IMPLANT
IRRIGATOR STRYKERFLOW (MISCELLANEOUS)
IV NS 1000ML (IV SOLUTION) ×3
IV NS 1000ML BAXH (IV SOLUTION) ×2 IMPLANT
KIT RM TURNOVER STRD PROC AR (KITS) ×3 IMPLANT
LIGASURE IMPACT 36 18CM CVD LR (INSTRUMENTS) ×1 IMPLANT
NDL SAFETY 25GX1.5 (NEEDLE) ×3 IMPLANT
NEEDLE VERESS 14GA 120MM (NEEDLE) ×1 IMPLANT
NS IRRIG 500ML POUR BTL (IV SOLUTION) ×3 IMPLANT
PACK LAP CHOLECYSTECTOMY (MISCELLANEOUS) ×3 IMPLANT
RELOAD PROXIMATE 75MM BLUE (ENDOMECHANICALS) ×6 IMPLANT
RELOAD STAPLE 75 3.8 BLU REG (ENDOMECHANICALS) IMPLANT
SCALPEL HARMONIC ACE (MISCELLANEOUS) IMPLANT
SCISSORS METZENBAUM CVD 33 (INSTRUMENTS) IMPLANT
SHEARS HARMONIC STRL 23CM (MISCELLANEOUS) IMPLANT
SLEEVE ENDOPATH XCEL 5M (ENDOMECHANICALS) ×3 IMPLANT
SPONGE LAP 18X18 5 PK (GAUZE/BANDAGES/DRESSINGS) ×4 IMPLANT
STAPLER PROXIMATE 75MM BLUE (STAPLE) ×1 IMPLANT
STAPLER SKIN PROX 35W (STAPLE) ×3 IMPLANT
SUT ETHILON 3-0 KS 30 BLK (SUTURE) ×2 IMPLANT
SUT PDS AB 1 TP1 96 (SUTURE) ×4 IMPLANT
SUT SILK 3-0 (SUTURE) ×4 IMPLANT
SUT VIC AB 2-0 CT2 27 (SUTURE) ×3 IMPLANT
SUT VIC AB 3-0 SH 27 (SUTURE) ×3
SUT VIC AB 3-0 SH 27X BRD (SUTURE) ×2 IMPLANT
SUT VICRYL 0 AB UR-6 (SUTURE) ×3 IMPLANT
TROCAR 5M 150ML BLDLS (TROCAR) ×1 IMPLANT
TROCAR XCEL BLUNT TIP 100MML (ENDOMECHANICALS) IMPLANT
TROCAR XCEL NON-BLD 11X100MML (ENDOMECHANICALS) IMPLANT
TROCAR XCEL NON-BLD 5MMX100MML (ENDOMECHANICALS) ×3 IMPLANT
TUBING INSUFFLATOR HI FLOW (MISCELLANEOUS) ×3 IMPLANT

## 2015-10-22 NOTE — ED Notes (Signed)
Dr Tonita Congwoodham at bedside att

## 2015-10-22 NOTE — ED Notes (Signed)
Patient transported to CT 

## 2015-10-22 NOTE — Anesthesia Preprocedure Evaluation (Signed)
Anesthesia Evaluation  Patient identified by MRN, date of birth, ID band Patient awake    Reviewed: Allergy & Precautions, NPO status , Patient's Chart, lab work & pertinent test results  History of Anesthesia Complications Negative for: history of anesthetic complications  Airway Mallampati: II  TM Distance: >3 FB Neck ROM: Full    Dental  (+) Poor Dentition, Chipped   Pulmonary neg pulmonary ROS, neg sleep apnea, neg COPD,    breath sounds clear to auscultation- rhonchi (-) wheezing      Cardiovascular Exercise Tolerance: Good hypertension (not on medication), (-) CAD and (-) Past MI  Rhythm:Regular Rate:Normal - Systolic murmurs and - Diastolic murmurs    Neuro/Psych negative neurological ROS  negative psych ROS   GI/Hepatic Neg liver ROS, GERD  Medicated,  Endo/Other  neg diabetesMorbid obesity  Renal/GU negative Renal ROS     Musculoskeletal Ehlers danlos    Abdominal (+) + obese,   Peds  Hematology negative hematology ROS (+)   Anesthesia Other Findings   Reproductive/Obstetrics                             Anesthesia Physical  Anesthesia Plan  ASA: II  Anesthesia Plan: General   Post-op Pain Management:    Induction: Intravenous  Airway Management Planned: Oral ETT  Additional Equipment:   Intra-op Plan:   Post-operative Plan: Extubation in OR  Informed Consent: I have reviewed the patients History and Physical, chart, labs and discussed the procedure including the risks, benefits and alternatives for the proposed anesthesia with the patient or authorized representative who has indicated his/her understanding and acceptance.   Dental advisory given  Plan Discussed with: CRNA and Anesthesiologist  Anesthesia Plan Comments:         Anesthesia Quick Evaluation

## 2015-10-22 NOTE — ED Provider Notes (Signed)
[facility] Emergency Department Provider Note  Time seen: 3:54 PM  I have reviewed the triage vital signs and the nursing notes.   HISTORY  Chief Complaint Abdominal Pain    HPI Barbara Hawkins is a 40 y.o. female with a past medical history of diverticulitis who presents the emergency department with lower abdominal pain. According to the patient 10/15/15 she had a surgery for debridement of a wound to her left leg. The wound is currently covered with a wound VAC dressing. Asians states since that time she has had progressive abdominal pain mostly in the lower abdomen and left lower quadrant. Denies nausea or vomiting. She states she has been fairly constipated as she has been taking antibiotics as well as pain medication over the past one week. She states last night she took Castor oil for her constipation, which considerably worsening abdominal pain and by 9:00 this morning it was severe. Patient was hoping which is constipation, tried having a bowel movement but was not able to so she came to the emergency department for evaluation. Patient denies any fever.  Past Medical History:  Diagnosis Date  . Diverticulitis   . Ehlers-Danlos disease     Patient Active Problem List   Diagnosis Date Noted  . Open thigh wound 10/14/2015  . Frequent UTI 10/04/2015  . Open wound of left thigh 09/26/2015    Past Surgical History:  Procedure Laterality Date  . APPLICATION OF WOUND VAC Left 09/26/2015   Procedure: APPLICATION OF WOUND VAC;  Surgeon: Kieth Brightly, MD;  Location: [facility] ORS;  Service: General;  Laterality: Left;  . APPLICATION OF WOUND VAC Left 10/15/2015   Procedure: APPLICATION OF WOUND VAC;  Surgeon: Tiney Rouge III, MD;  Location: [facility] ORS;  Service: General;  Laterality: Left;  . FOOT SURGERY    . INCISION AND DRAINAGE ABSCESS Left 09/26/2015   Procedure: INCISION AND DRAINAGE ABSCESS;  Surgeon: Kieth Brightly, MD;  Location: [facility] ORS;  Service:  General;  Laterality: Left;  . IRRIGATION AND DEBRIDEMENT ABSCESS Left 10/15/2015   Procedure: IRRIGATION AND DEBRIDEMENT LEG ABSCESS;  Surgeon: Tiney Rouge III, MD;  Location: [facility] ORS;  Service: General;  Laterality: Left;  . TUBAL LIGATION Bilateral     Prior to Admission medications   Medication Sig Start Date End Date Taking? Authorizing Provider  aspirin EC 81 MG tablet Take 81 mg by mouth as needed for mild pain or moderate pain.    Historical Provider, MD  ciprofloxacin (CIPRO) 500 MG tablet Take 1 tablet (500 mg total) by mouth 2 (two) times daily. 10/18/15   Diego F Pabon, MD  ibuprofen (ADVIL,MOTRIN) 800 MG tablet Take 1 tablet (800 mg total) by mouth every 8 (eight) hours as needed for moderate pain. 07/21/15   Irean Hong, MD  Multiple Vitamin (MULTIVITAMIN) tablet Take 1 tablet by mouth daily.    Historical Provider, MD  omeprazole (PRILOSEC) 20 MG capsule Take 20 mg by mouth daily.     Historical Provider, MD  oxyCODONE (OXY IR/ROXICODONE) 5 MG immediate release tablet Take 1-2 tablets (5-10 mg total) by mouth every 3 (three) hours as needed for severe pain or breakthrough pain. 10/18/15   Diego Ronnette Juniper, MD    Allergies  Allergen Reactions  . Morphine Hives  . Morphine And Related Itching and Swelling  . Septra [Sulfamethoxazole-Trimethoprim] Hives and Swelling  . Sulfamethoxazole-Trimethoprim Hives    Family History  Problem Relation Age of Onset  . Stroke Mother   .  Kidney disease Mother   . Hypertension Father   . Thyroid disease Father   . Stroke Brother     Social History Social History  Substance Use Topics  . Smoking status: Never Smoker  . Smokeless tobacco: Never Used  . Alcohol use Yes    Review of Systems Constitutional: Negative for fever Cardiovascular: Negative for chest pain. Respiratory: Negative for shortness of breath. Gastrointestinal: Positive for abdominal pain mostly in the left lower quadrant. Genitourinary: Negative for  dysuria. Musculoskeletal: Left leg pain which is improving since the surgery Neurological: Negative for headach 10-point ROS otherwise negative.  ____________________________________________   PHYSICAL EXAM:  VITAL SIGNS: ED Triage Vitals  Enc Vitals Group     BP 10/22/15 1435 116/74     Pulse Rate 10/22/15 1435 (!) 125     Resp 10/22/15 1435 20     Temp 10/22/15 1435 97.9 F (36.6 C)     Temp Source 10/22/15 1435 Oral     SpO2 10/22/15 1435 97 %     Weight 10/22/15 1436 296 lb (134.3 kg)     Height 10/22/15 1436 5\' 6"  (1.676 m)     Head Circumference --      Peak Flow --      Pain Score 10/22/15 1436 8     Pain Loc --      Pain Edu? --      Excl. in GC? --     Constitutional: Alert and oriented. Well appearing and in no distress. Eyes: Normal exam ENT   Head: Normocephalic and atraumatic.   Mouth/Throat: Mucous membranes are moist. Cardiovascular: Normal rate, regular rhythm. No murmur Respiratory: Normal respiratory effort without tachypnea nor retractions. Breath sounds are clear  Gastrointestinal: Soft, moderate diffuse abdominal tenderness to palpation, somewhat worse than left lower quadrant. No rebound or guarding. No distention. Patient is obese. Musculoskeletal: Nontender with normal range of motion in all extremities wound VAC dressing applied to left thigh Neurologic:  Normal speech and language. No gross focal neurologic deficits  Skin:  Skin is warm, dry  Psychiatric: Mood and affect are normal.   ____________________________________________    EKG  EKG reviewed and interpreted by myself shows sinus tachycardia 123 bpm, narrow QRS, left axis deviation, normal intervals, no concerning ST changes.  ____________________________________________    RADIOLOGY  CT concerning for free air in the abdomen  ____________________________________________   INITIAL IMPRESSION / ASSESSMENT AND PLAN / ED COURSE  Pertinent labs & imaging results that  were available during my care of the patient were reviewed by me and considered in my medical decision making (see chart for details).  Patient presents for worsening abdominal pain. Patient had a surgery 1 week ago for Department of the left leg wound. The wound is currently covered with open VAC dressing. States improved discomfort in the left leg, but she has been having progressively worsening abdominal pain especially left lower quadrant abdominal pain. Patient has a history of diverticulitis in the past. States she has been very constipated while taking the pain medication. Denies any vomiting. Denies any fever. We will proceed with a CT the abdomen/pelvis further evaluate, does pain and nausea medication as well as IV fluids while awaiting CT results.  CT concerning for free air in the abdomen. I discussed the patient with Dr.Pabon, will be down to see the patient. We'll reorder pain medication, dose IV Zosyn and continue to closely monitor in the emergency department.  CRITICAL CARE Performed by: Minna AntisPADUCHOWSKI, Braxson Hollingsworth   Total  critical care time: 30 minutes  Critical care time was exclusive of separately billable procedures and treating other patients.  Critical care was necessary to treat or prevent imminent or life-threatening deterioration.  Critical care was time spent personally by me on the following activities: development of treatment plan with patient and/or surrogate as well as nursing, discussions with consultants, evaluation of patient's response to treatment, examination of patient, obtaining history from patient or surrogate, ordering and performing treatments and interventions, ordering and review of laboratory studies, ordering and review of radiographic studies, pulse oximetry and re-evaluation of patient's condition.  ____________________________________________   FINAL CLINICAL IMPRESSION(S) / ED DIAGNOSES  Abdominal pain Abdominal free air   Minna Antis,  MD 10/22/15 [phone]

## 2015-10-22 NOTE — ED Triage Notes (Signed)
Pt reports that she had surgery 1 week ago on a wound to buttock, pt now has wound vac and pt has been taking pain medications and now has been constipated. Pt reports a few small BM's but nothing normal. Pt reports last pain med was 1320.

## 2015-10-22 NOTE — Anesthesia Procedure Notes (Signed)
Procedure Name: Intubation Date/Time: 10/22/2015 10:51 PM Performed by: Stormy FabianURTIS, Ishani Goldwasser Pre-anesthesia Checklist: Patient identified, Patient being monitored, Timeout performed, Emergency Drugs available and Suction available Patient Re-evaluated:Patient Re-evaluated prior to inductionOxygen Delivery Method: Circle system utilized Preoxygenation: Pre-oxygenation with 100% oxygen Intubation Type: IV induction Ventilation: Mask ventilation without difficulty Laryngoscope Size: Mac and 3 Grade View: Grade I Tube type: Oral Tube size: 7.0 mm Number of attempts: 1 Airway Equipment and Method: Stylet Placement Confirmation: ETT inserted through vocal cords under direct vision,  positive ETCO2 and breath sounds checked- equal and bilateral Secured at: 21 cm Tube secured with: Tape Dental Injury: Teeth and Oropharynx as per pre-operative assessment

## 2015-10-22 NOTE — ED Notes (Signed)
Patient c/o abdominal pain since 9 am and nausea. Patient denies diarrhea or vomiting. Patient states that she has not had a bowel movement in almost a week. Patient recently had surgery and is on narcotic pain medication.   Patient states that around 2 am she took a teaspoon of caster oil to try to induce BM and the cramping started around 9 am.

## 2015-10-22 NOTE — H&P (Signed)
Patient ID: Barbara Hawkins, female   DOB: [birth year], 40 y.o.   MRN: [id number]  CC: ABDOMINAL PAIN  HPI Barbara Hawkins is a 40 y.o. female who presents to the ER for evaluation of progressively worsening abdominal pain. Patient states that the pain started abruptly this morning and her midepigastric region and has progressively worsened throughout the day. She is well known to the surgical service due to a wound to her left thigh that has been debrided numerous times over the last several weeks. Patient reports that she has been taking Tylenol and narcotic pain medications secondary to her thigh wound. She reports she is still constipated over the last week and a half but has been having small bowel movements and passing flatus. Since the pain has started she has felt flushed has not taken her temperature. She's had nausea without vomiting. She denies any chest pain, shortness of breath. Patient reports that drinking the contrast for her CT scan caused her pain to worsen. She reports that nothing at home with making it better but the pain medication in the emergency department has provided her with some relief.  HPI  Past Medical History:  Diagnosis Date  . Collagen vascular disease (HCC)   . Diverticulitis   . Ehlers-Danlos disease     Past Surgical History:  Procedure Laterality Date  . APPLICATION OF WOUND VAC Left 09/26/2015   Procedure: APPLICATION OF WOUND VAC;  Surgeon: Kieth Brightly, MD;  Location: [facility] ORS;  Service: General;  Laterality: Left;  . APPLICATION OF WOUND VAC Left 10/15/2015   Procedure: APPLICATION OF WOUND VAC;  Surgeon: Tiney Rouge III, MD;  Location: [facility] ORS;  Service: General;  Laterality: Left;  . FOOT SURGERY    . INCISION AND DRAINAGE ABSCESS Left 09/26/2015   Procedure: INCISION AND DRAINAGE ABSCESS;  Surgeon: Kieth Brightly, MD;  Location: [facility] ORS;  Service: General;  Laterality: Left;  . IRRIGATION AND DEBRIDEMENT ABSCESS Left 10/15/2015   Procedure:  IRRIGATION AND DEBRIDEMENT LEG ABSCESS;  Surgeon: Tiney Rouge III, MD;  Location: [facility] ORS;  Service: General;  Laterality: Left;  . TUBAL LIGATION Bilateral     Family History  Problem Relation Age of Onset  . Stroke Mother   . Kidney disease Mother   . Hypertension Father   . Thyroid disease Father   . Stroke Brother     Social History Social History  Substance Use Topics  . Smoking status: Never Smoker  . Smokeless tobacco: Never Used  . Alcohol use Yes    Allergies  Allergen Reactions  . Morphine Hives  . Morphine And Related Itching and Swelling  . Septra [Sulfamethoxazole-Trimethoprim] Hives and Swelling  . Sulfamethoxazole-Trimethoprim Hives    Current Facility-Administered Medications  Medication Dose Route Frequency Provider Last Rate Last Dose  . iopamidol (ISOVUE-300) 61 % injection 30 mL  30 mL Oral Once PRN Minna Antis, MD      . piperacillin-tazobactam (ZOSYN) IVPB 3.375 g  3.375 g Intravenous Once Minna Antis, MD 100 mL/hr at 10/22/15 1957 3.375 g at 10/22/15 1957   Current Outpatient Prescriptions  Medication Sig Dispense Refill  . acetaminophen (TYLENOL) 500 MG tablet Take 1,000 mg by mouth every 6 (six) hours as needed.    . ciprofloxacin (CIPRO) 500 MG tablet Take 1 tablet (500 mg total) by mouth 2 (two) times daily. 14 tablet 0  . ibuprofen (ADVIL,MOTRIN) 800 MG tablet Take 1 tablet (800 mg total) by mouth every 8 (eight) hours as needed  for moderate pain. 15 tablet 0  . Multiple Vitamin (MULTIVITAMIN) tablet Take 1 tablet by mouth daily.    Marland Kitchen. omeprazole (PRILOSEC) 20 MG capsule Take 20 mg by mouth daily.     Marland Kitchen. oxyCODONE (OXY IR/ROXICODONE) 5 MG immediate release tablet Take 1-2 tablets (5-10 mg total) by mouth every 3 (three) hours as needed for severe pain or breakthrough pain. 30 tablet 0  . aspirin EC 81 MG tablet Take 81 mg by mouth as needed for mild pain or moderate pain.       Review of Systems A Multi-point review of systems was  asked and was negative except for the findings documented in the history of present illness  Physical Exam Blood pressure 116/74, pulse (!) 125, temperature 97.9 F (36.6 C), temperature source Oral, resp. rate 20, height 5\' 6"  (1.676 m), weight 134.3 kg (296 lb), last menstrual period 09/27/2015, SpO2 97 %. CONSTITUTIONAL: Resting in bed in no acute distress. EYES: Pupils are equal, round, and reactive to light, Sclera are non-icteric. EARS, NOSE, MOUTH AND THROAT: The oropharynx is clear. The oral mucosa is pink and moist. Hearing is intact to voice. LYMPH NODES:  Lymph nodes in the neck are normal. RESPIRATORY:  Lungs are clear. There is normal respiratory effort, with equal breath sounds bilaterally, and without pathologic use of accessory muscles. CARDIOVASCULAR: Heart is regular without murmurs, gallops, or rubs. GI: The abdomen is very large, soft, tender to palpation in the upper abdomen but without any obvious rebound or guarding, and nondistended. There are no palpable masses. There is no hepatosplenomegaly. There are normal bowel sounds in all quadrants. GU: Rectal deferred.   MUSCULOSKELETAL: Normal muscle strength and tone. No cyanosis or edema.   SKIN: Turgor is good with a wound VAC in place to a left posterior thigh wound. NEUROLOGIC: Motor and sensation is grossly normal. Cranial nerves are grossly intact. PSYCH:  Oriented to person, place and time. Affect is normal.  Data Reviewed Images and labs reviewed. Labs show a leukocytosis of 15.7. The remainder of her labs were within normal limits. CT scan of the abdomen shows a small foci of free air in the midabdomen. It is near her stomach and her transverse colon. There is evidence of inflammation in the same area. This is consistent with a perforated viscus. I have personally reviewed the patient's imaging, laboratory findings and medical records.    Assessment    Perforated viscus    Plan    40 year old female with a  perforated viscus. Had a long conversation with the patient about the possible location secondary perforated. Most common would be a perforated peptic ulcer given her recent history and her narcotic and NSAID usage. However, she also has a history of diverticulitis in the area of inflammation appears to also be near her transverse colon. Discussed that we would attempt a laparoscopic approach first and if possible repair the perforation via a minimally invasive approach. If it is not possible to repair the area of perforation or to identify the area of perforation within transition to an open procedure and repair whatever damage is required. Discussed that if her colon is perforated she may require an ostomy and that this would be temporary. All questions answered to the patient's satisfaction and she voiced understanding and agreed to proceed. Plan to take to the operating room emergently this evening followed by admission.     Time spent with the patient was 70 minutes, with more than 50% of the time spent  in face-to-face education, counseling and care coordination.     Ricarda Frame, MD FACS General Surgeon 10/22/2015, 8:24 PM

## 2015-10-23 DIAGNOSIS — K668 Other specified disorders of peritoneum: Secondary | ICD-10-CM

## 2015-10-23 DIAGNOSIS — R1084 Generalized abdominal pain: Secondary | ICD-10-CM

## 2015-10-23 DIAGNOSIS — R69 Illness, unspecified: Secondary | ICD-10-CM

## 2015-10-23 LAB — COMPREHENSIVE METABOLIC PANEL
ALBUMIN: 2.8 g/dL — AB (ref 3.5–5.0)
ALT: 33 U/L (ref 14–54)
AST: 18 U/L (ref 15–41)
Alkaline Phosphatase: 69 U/L (ref 38–126)
Anion gap: 7 (ref 5–15)
BILIRUBIN TOTAL: 0.5 mg/dL (ref 0.3–1.2)
BUN: 7 mg/dL (ref 6–20)
CHLORIDE: 105 mmol/L (ref 101–111)
CO2: 26 mmol/L (ref 22–32)
Calcium: 8.1 mg/dL — ABNORMAL LOW (ref 8.9–10.3)
Creatinine, Ser: 0.64 mg/dL (ref 0.44–1.00)
GFR calc Af Amer: >60 mL/min (ref >60)
GFR calc non Af Amer: >60 mL/min (ref >60)
GLUCOSE: 136 mg/dL — AB (ref 65–99)
POTASSIUM: 3.7 mmol/L (ref 3.5–5.1)
Sodium: 138 mmol/L (ref 135–145)
TOTAL PROTEIN: 6.3 g/dL — AB (ref 6.5–8.1)

## 2015-10-23 LAB — CBC
HEMATOCRIT: 32.6 % — AB (ref 35.0–47.0)
HEMOGLOBIN: 11 g/dL — AB (ref 12.0–16.0)
MCH: 28.2 pg (ref 26.0–34.0)
MCHC: 33.6 g/dL (ref 32.0–36.0)
MCV: 83.8 fL (ref 80.0–100.0)
Platelets: 256 10*3/uL (ref 150–440)
RBC: 3.89 MIL/uL (ref 3.80–5.20)
RDW: 15.8 % — ABNORMAL HIGH (ref 11.5–14.5)
WBC: 15.1 10*3/uL — ABNORMAL HIGH (ref 3.6–11.0)

## 2015-10-23 LAB — MAGNESIUM: Magnesium: 1.5 mg/dL — ABNORMAL LOW (ref 1.7–2.4)

## 2015-10-23 LAB — PHOSPHORUS: Phosphorus: 3.8 mg/dL (ref 2.5–4.6)

## 2015-10-23 MED ORDER — SODIUM CHLORIDE 0.9 % IV SOLN
INTRAVENOUS | Status: DC
  Administered 2015-10-23 – 2015-10-24 (×3): via INTRAVENOUS

## 2015-10-23 MED ORDER — MAGNESIUM SULFATE 2 GM/50ML IV SOLN
2.0000 g | Freq: Once | INTRAVENOUS | Status: AC
  Administered 2015-10-23: 2 g via INTRAVENOUS
  Filled 2015-10-23: qty 50

## 2015-10-23 MED ORDER — HYDROMORPHONE 1 MG/ML IV SOLN
INTRAVENOUS | Status: DC
  Administered 2015-10-23: 3 mg via INTRAVENOUS
  Administered 2015-10-23: 3.3 mg via INTRAVENOUS
  Administered 2015-10-23: 5 mg via INTRAVENOUS
  Administered 2015-10-23: 03:00:00 via INTRAVENOUS
  Administered 2015-10-23: 3.3 mg via INTRAVENOUS
  Administered 2015-10-24: 1 mg via INTRAVENOUS
  Administered 2015-10-24: 03:00:00 via INTRAVENOUS
  Administered 2015-10-24: 2.7 mg via INTRAVENOUS
  Administered 2015-10-24: 4.2 mg via INTRAVENOUS
  Administered 2015-10-24: 4.8 mg via INTRAVENOUS
  Administered 2015-10-24 – 2015-10-25 (×3): 1.5 mg via INTRAVENOUS
  Administered 2015-10-25 (×2): 2.7 mg via INTRAVENOUS
  Administered 2015-10-25 (×2): 2.1 mg via INTRAVENOUS
  Administered 2015-10-26: 8 mg via INTRAVENOUS
  Administered 2015-10-26: 1.8 mg via INTRAVENOUS
  Administered 2015-10-26: 9 mg via INTRAVENOUS
  Administered 2015-10-26: 2.1 mg via INTRAVENOUS
  Administered 2015-10-26: 0.9 mg via INTRAVENOUS
  Administered 2015-10-26: 1.5 mg via INTRAVENOUS
  Administered 2015-10-26: 5 mg via INTRAVENOUS
  Administered 2015-10-26: 13:00:00 via INTRAVENOUS
  Administered 2015-10-27 (×2): 0.9 mg via INTRAVENOUS
  Filled 2015-10-23 (×4): qty 25

## 2015-10-23 MED ORDER — LIDOCAINE HCL 1 % IJ SOLN
INTRAMUSCULAR | Status: DC | PRN
  Administered 2015-10-23: 3 mL

## 2015-10-23 MED ORDER — ONDANSETRON HCL 4 MG/2ML IJ SOLN
INTRAMUSCULAR | Status: DC | PRN
  Administered 2015-10-23: 4 mg via INTRAVENOUS

## 2015-10-23 MED ORDER — ONDANSETRON HCL 4 MG/2ML IJ SOLN
4.0000 mg | Freq: Four times a day (QID) | INTRAMUSCULAR | Status: DC | PRN
  Administered 2015-10-23: 4 mg via INTRAVENOUS
  Filled 2015-10-23: qty 2

## 2015-10-23 MED ORDER — SODIUM CHLORIDE 0.9% FLUSH: 9.0000 mL | INTRAVENOUS | Status: DC | PRN

## 2015-10-23 MED ORDER — FENTANYL CITRATE (PF) 100 MCG/2ML IJ SOLN
25.0000 ug | INTRAMUSCULAR | Status: AC | PRN
  Administered 2015-10-23 (×6): 25 ug via INTRAVENOUS

## 2015-10-23 MED ORDER — DIPHENHYDRAMINE HCL 50 MG/ML IJ SOLN
12.5000 mg | Freq: Four times a day (QID) | INTRAMUSCULAR | Status: DC | PRN
  Administered 2015-10-23: 12.5 mg via INTRAVENOUS
  Filled 2015-10-23: qty 1

## 2015-10-23 MED ORDER — NALOXONE HCL 0.4 MG/ML IJ SOLN: 0.4000 mg | INTRAMUSCULAR | Status: DC | PRN

## 2015-10-23 MED ORDER — ONDANSETRON HCL 4 MG/2ML IJ SOLN: 4.0000 mg | Freq: Once | INTRAMUSCULAR | Status: DC | PRN

## 2015-10-23 MED ORDER — DIPHENHYDRAMINE HCL 12.5 MG/5ML PO ELIX: 12.5000 mg | ORAL_SOLUTION | Freq: Four times a day (QID) | ORAL | Status: DC | PRN

## 2015-10-23 MED ORDER — BUPIVACAINE HCL (PF) 0.25 % IJ SOLN
INTRAMUSCULAR | Status: DC | PRN
  Administered 2015-10-23: 3 mL

## 2015-10-23 MED ORDER — FENTANYL CITRATE (PF) 100 MCG/2ML IJ SOLN
INTRAMUSCULAR | Status: AC
  Filled 2015-10-23: qty 2

## 2015-10-23 MED ORDER — NEOSTIGMINE METHYLSULFATE 10 MG/10ML IV SOLN
INTRAVENOUS | Status: DC | PRN
  Administered 2015-10-23: 3 mg via INTRAVENOUS

## 2015-10-23 NOTE — Anesthesia Postprocedure Evaluation (Signed)
Anesthesia Post Note  Patient: Barbara Hawkins  Procedure(s) Performed: Procedure(s) (LRB): LAPAROSCOPY DIAGNOSTIC (N/A) LAPAROTOMY TRANSVERSE COLON RESECTION WITH CREATION OF COLOSTOMY AND MUCUS FISTULA CREATION  Patient location during evaluation: PACU Anesthesia Type: General Level of consciousness: awake and alert Pain management: pain level controlled Vital Signs Assessment: post-procedure vital signs reviewed and stable Respiratory status: spontaneous breathing and respiratory function stable Cardiovascular status: stable Anesthetic complications: no    Last Vitals:  Vitals:   10/22/15 2107 10/22/15 2206  BP: 124/74 132/88  Pulse: (!) 103 (!) 122  Resp: 18 20  Temp: 36.7 C 36.9 C    Last Pain:  Vitals:   10/22/15 2206  TempSrc: Oral  PainSc:                  [name] K

## 2015-10-23 NOTE — Transfer of Care (Signed)
Immediate Anesthesia Transfer of Care Note  Patient: Barbara Hawkins  Procedure(s) Performed: Procedure(s): LAPAROSCOPY DIAGNOSTIC (N/A) LAPAROTOMY TRANSVERSE COLON RESECTION WITH CREATION OF COLOSTOMY AND MUCUS FISTULA CREATION  Patient Location: PACU  Anesthesia Type:General  Level of Consciousness: sedated  Airway & Oxygen Therapy: Patient Spontanous Breathing and Patient connected to face mask oxygen  Post-op Assessment: Report given to RN and Post -op Vital signs reviewed and stable  Post vital signs: Reviewed and stable  Last Vitals:  Vitals:   10/22/15 2206 10/23/15 0203  BP: 132/88 118/88  Pulse: (!) 122 (!) 101  Resp: 20 (!) 27  Temp: 36.9 C 36.3 C    Complications: No apparent anesthesia complications

## 2015-10-23 NOTE — Op Note (Signed)
Pre-operative Diagnosis: Perforated viscus  Post-operative Diagnosis: Perforated transverse colon Procedure performed: Diagnostic laparoscopy, exploratory laparotomy with resection of transverse colon, creation of end colostomy and mucous fistula  Surgeon: Ricarda Frame   Assistants: None  Anesthesia: General endotracheal anesthesia  ASA Class: 3  Surgeon: Ricarda Frame, MD FACS  Anesthesia: Gen. with endotracheal tube  Assistant: None  Procedure Details  The patient was seen again in the Holding Room. The benefits, complications, treatment options, and expected outcomes were discussed with the patient. The risks of bleeding, infection, recurrence of symptoms, failure to resolve symptoms,  bowel injury, any of which could require further surgery were reviewed with the patient.   The patient was taken to Operating Room, identified as Barbara Hawkins and the procedure verified.  A Time Out was held and the above information confirmed.  Prior to the induction of general anesthesia, antibiotic prophylaxis was administered. VTE prophylaxis was in place. General endotracheal anesthesia was then administered and tolerated well. After the induction, the abdomen was prepped with Chloraprep and draped in the sterile fashion. The patient was positioned in the supine position.  The procedure began with a right upper quadrant Veress needle approach for laparoscopy. An area of skin 2 finger breath below the costal margin was localized with a 50-50 mixture of 1% lidocaine 0.5% Marcaine plain and a Veress needle was inserted. After successful saline drop test and pneumoperitoneum was established at 15 mmHg. Then using a 5 mm Optiview trocar access to the peritoneum was obtained. There is no evidence of damage deeper stranding structures upon entering the abdomen. We were then able to place an infraumbilical 5 trocar under direct visualization after localization. From this we were able to visualize  purulent fluid in both right and left paracolic gutters. The stomach was able to visualize noted to be normal. The transverse colon just below the stomach however had an obvious area of necrosis. Due to the patient's body habitus and adequate operative space for laparoscopy was difficult to obtain. Due to the friability of the tissues and inability to maintain hemostasis we chose to convert to an open procedure.  Laparoscopy was then abandoned and a midline incision was made with a 10 blade scalpel and using comminution blunt dissection and electrocautery the dissection was taken down to the level of the fascia. The fascia was entered into sharply and then extended with electrocautery protecting the deep structures. The area of active bleeding was made hemostatic with directed electrocautery. The entire abdomen was able to be investigated. The only area of perforation was noted to be in the mid transverse colon. The bowel proximal and distal this appeared to be of normal caliber without any evidence of obvious inflammation. Using a LigaSure device the omentum was split up to the level of the perforation. This allowed the mesentery to be freed up and the transverse colon area. Perforation was excised with 2 firings of an Endo GIA 75 mm blue load stapler. This was passed off the field as a specimen.  Given the location of the perforation and spillage of enteric contents of the abdomen the decision was made to create an end colostomy and a mucous fistula. The staple off ends of bowel were freed up proximally and distally to allow for this through the patient's large abdominal wall. Once the proximal colon was appropriately dissected and ostomy site was chosen in the right upper quadrant. The skin was excised with electrocautery using combination of electrocautery and blunt dissection was taken down  to level of fascia which was dilated up to accept 2 fingerbreadths. A mesenteric window was created for: Which was  able to be easily brought up through the ostomy site. Identical procedure was performed in the patient's left upper quadrant for the mucous fistula.  Entire abdomen was then copiously irrigated with 3 L of warm normal saline. This was done in bilateral quadrants and the pelvis. Irrigation returned clear at the end of this. Due to the copious amount of spillage into the abdomen 2 Blake drains were brought up to the field. A right lower quadrant Blake drain was placed into the right paracolic gutter. The left lower quadrant Blake drain was placed going down the left pericolic gutter and into the pelvis. These were secured at the skin with a 3-0 nylon suture.  We then decided to close the midline. 2 #1 looped PDS sutures were used to close the midline taking care to protect the deep internal structures. Once the fascia was closed due to the patient's body habitus and the spillage of enteric contents a large Penrose drain was placed in the subcutaneous and the skin was loosely reapproximated with staples. We then turned our attention to the colostomy. The colon was sharply opened and hemostasis was assured with direct electrocautery. The deeper colon wall was secured to the deep dermal tissue with interrupted 3-0 silk suture. A circumferential Brooking of the colostomy was then performed also with 3-0 silk suture. We then turned our attention to the mucous fistula. This was also sharply opened and then secured at the skin site without Brooking in a simple fashion with interrupted 3-0 silk suture. The mucosa was noted to be viable to both sites. A dressing of plain gauze, ABDs pad and tape was placed to the mucous fistula. A colostomy bag was cut to the appropriate size and placed over the end colostomy. The midline was covered with a honeycomb dressing. Drain sponges were put in both drain sites.  The patient tolerated the procedure well. There were no immediate, complications. All counts are correct at the end  the procedure. Patient was awoken from general endotracheal anesthesia and transferred to the PACU in stable condition.  Findings: Perforated transverse colon   Estimated Blood Loss: 100 mL         Drains: 19 French Blake drains 2         Specimens: Transverse colon          Complications: None                  Condition: Good   Ricarda Frameharles Miraya Cudney, MD, FACS

## 2015-10-23 NOTE — Progress Notes (Signed)
10/23/2015 10:39 AM  Applied ostomy bag to second ostomy site in upper left abdominal quadrant according to Dr. Hurman HornPabon's instructions.  Bradly Chrisougherty, Sharilynn Cassity E, RN

## 2015-10-23 NOTE — Care Management Note (Signed)
Case Management Note  Patient Details  Name: Felicity Coyermanda P Warburton MRN: [id number] Date of Birth: 1975-12-30  Subjective/Objective:        Uninsured Mrs Artis FlockWolfe is requesting assistance with medications. Surgery earlier today. [facility] provide coupons, [facility], or [facility], whatever is appropriate  when her discharge meds are known. [facility] with [facility] during previous admission in Sept 2017.            Action/Plan:   Expected Discharge Date:                  Expected Discharge Plan:     In-House Referral:     Discharge planning Services     Post Acute Care Choice:    Choice offered to:     DME Arranged:    DME Agency:     HH Arranged:    HH Agency:     Status of Service:     If discussed at [employer] Length of Stay Meetings, dates discussed:    Additional Comments:  Jihan Rudy A, RN 10/23/2015, 10:32 AM

## 2015-10-23 NOTE — Clinical Social Work Note (Signed)
CSW received a consult for assistance with medication at home. CSW informed RNCM. CSW signing off.  Argentina PonderKaren Martha Brayant Dorr, MSW, LCSW-A [phone]

## 2015-10-23 NOTE — Progress Notes (Signed)
POD # 0 Vss, labs ok Good uo  PE NAD Abd: soft, drains in place, mucous fistula was covered, it is retracted, ostomy appliance ordered  A/P continue ngt, npo a/bs Hold off for wound vac change in her thigh

## 2015-10-23 NOTE — Brief Op Note (Signed)
10/22/2015  1:50 AM  PATIENT:  Barbara CoyerAmanda P Holan  40 y.o. female  PRE-OPERATIVE DIAGNOSIS:  Perforated viscus  POST-OPERATIVE DIAGNOSIS:  Perforated viscus  PROCEDURE:  Procedure(s): LAPAROSCOPY DIAGNOSTIC (N/A) LAPAROTOMY TRANSVERSE COLON RESECTION WITH CREATION OF COLOSTOMY AND MUCUS FISTULA CREATION  SURGEON:  Surgeon(s) and Role:    * Ricarda Frameharles Kaiyan Luczak, MD - Primary  PHYSICIAN ASSISTANT:   ASSISTANTS: none   ANESTHESIA:   general  EBL:  Total I/O In: 1000 [I.V.:1000] Out: 250 [Urine:200; Blood:50]  BLOOD ADMINISTERED:none  DRAINS: (5419fr) Blake drain(s) in the Right paracolic gutter and left gutter/pelvis   LOCAL MEDICATIONS USED:  MARCAINE   , XYLOCAINE  and Amount: 5 ml  SPECIMEN:  Source of Specimen:  transverse colon  DISPOSITION OF SPECIMEN:  PATHOLOGY  COUNTS:  YES  TOURNIQUET:  * No tourniquets in log *  DICTATION: .Dragon Dictation  PLAN OF CARE: Admit to inpatient   PATIENT DISPOSITION:  PACU - hemodynamically stable.   Delay start of Pharmacological VTE agent (>24hrs) due to surgical blood loss or risk of bleeding: no

## 2015-10-24 ENCOUNTER — Inpatient Hospital Stay: Payer: Self-pay

## 2015-10-24 LAB — CBC
HCT: 34 % — ABNORMAL LOW (ref 35.0–47.0)
HEMOGLOBIN: 11.2 g/dL — AB (ref 12.0–16.0)
MCH: 28 pg (ref 26.0–34.0)
MCHC: 32.8 g/dL (ref 32.0–36.0)
MCV: 85.3 fL (ref 80.0–100.0)
PLATELETS: 262 10*3/uL (ref 150–440)
RBC: 3.99 MIL/uL (ref 3.80–5.20)
RDW: 16.7 % — AB (ref 11.5–14.5)
WBC: 12.9 10*3/uL — ABNORMAL HIGH (ref 3.6–11.0)

## 2015-10-24 LAB — BASIC METABOLIC PANEL
Anion gap: 9 (ref 5–15)
BUN: 13 mg/dL (ref 6–20)
CALCIUM: 8 mg/dL — AB (ref 8.9–10.3)
CHLORIDE: 103 mmol/L (ref 101–111)
CO2: 25 mmol/L (ref 22–32)
CREATININE: 0.77 mg/dL (ref 0.44–1.00)
Glucose, Bld: 87 mg/dL (ref 65–99)
Potassium: 4.6 mmol/L (ref 3.5–5.1)
SODIUM: 137 mmol/L (ref 135–145)

## 2015-10-24 LAB — MAGNESIUM: MAGNESIUM: 2.1 mg/dL (ref 1.7–2.4)

## 2015-10-24 MED ORDER — KCL IN DEXTROSE-NACL 20-5-0.9 MEQ/L-%-% IV SOLN
INTRAVENOUS | Status: DC
  Administered 2015-10-24 – 2015-10-25 (×5): via INTRAVENOUS
  Administered 2015-10-26 – 2015-10-27 (×2): 1000 mL via INTRAVENOUS
  Administered 2015-10-27 (×2): via INTRAVENOUS
  Filled 2015-10-24 (×12): qty 1000

## 2015-10-24 MED ORDER — KETOROLAC TROMETHAMINE 30 MG/ML IJ SOLN
30.0000 mg | Freq: Four times a day (QID) | INTRAMUSCULAR | Status: AC
  Administered 2015-10-24 – 2015-10-29 (×19): 30 mg via INTRAVENOUS
  Filled 2015-10-24 (×20): qty 1

## 2015-10-24 MED ORDER — HYDROMORPHONE HCL 1 MG/ML IJ SOLN
0.5000 mg | INTRAMUSCULAR | Status: DC | PRN
  Administered 2015-10-24 (×2): 0.5 mg via INTRAMUSCULAR
  Filled 2015-10-24 (×3): qty 1

## 2015-10-24 NOTE — Care Management Note (Signed)
Case Management Note  Patient Details  Name: Barbara Hawkins MRN: [id number] Date of Birth: [birth year]  Subjective/Objective:      Anticipate that Barbara Hawkins may be discharged home again with a wound vac. She was last discharged home with a charity wound vac from [facility] on 10/18/15. She still has that home vac. [facility] from Barbara Hawkins's last admission will be active for 6 months before she has [facility] and [facility]. Sue Lushndrea will contact Barbara GottronJason Hinton on Monday to let him know whether Barbara Hawkins will need another charity home wound vac and [facility] will pick up the old one from her residence. Sue Lushndrea reports that she will have to run it through the [facility] wound vac team on Monday before sending a new charity wound vac. Case management will continue to follow for discharge planning.              Action/Plan:   Expected Discharge Date:                  Expected Discharge Plan:     In-House Referral:     Discharge planning Services     Post Acute Care Choice:    Choice offered to:     DME Arranged:    DME Agency:     HH Arranged:    HH Agency:     Status of Service:     If discussed at [employer] Length of Stay Meetings, dates discussed:    Additional Comments:  Danaja Lasota A, RN 10/24/2015, 2:45 PM

## 2015-10-24 NOTE — Progress Notes (Signed)
POD # 1 s/p colectomy w end colostomy and m fistula Doing ok, a bit depressed AVSS No output from ostomy Good U/O Labs ok  PE NAD And: decrease bs, drains serous, colostomy viable, no output, mucuos fistula retracted. No evidence of infection or peritonitis  A/p doing ok Continue ngt for ileus A/bs Wound vac change today, transition to wet /dry until we have [employer] supplies here mobilize

## 2015-10-24 NOTE — Progress Notes (Signed)
10/24/2015 6:37 PM  Pt's peripheral IV no longer working.  Asked Neurosurgeonadministrative coordinator to try and she failed.  Got order from MD Pabon for PICC line and order for IM dilaudid for pain control.  Called [facility] for PICC placement.  Will await on their response.  Bradly Chrisougherty, Bell Cai E, RN

## 2015-10-25 ENCOUNTER — Encounter: Payer: Self-pay | Admitting: General Surgery

## 2015-10-25 LAB — CBC
HCT: 27.5 % — ABNORMAL LOW (ref 35.0–47.0)
Hemoglobin: 8.9 g/dL — ABNORMAL LOW (ref 12.0–16.0)
MCH: 27.4 pg (ref 26.0–34.0)
MCHC: 32.3 g/dL (ref 32.0–36.0)
MCV: 84.8 fL (ref 80.0–100.0)
PLATELETS: 217 10*3/uL (ref 150–440)
RBC: 3.25 MIL/uL — ABNORMAL LOW (ref 3.80–5.20)
RDW: 16.2 % — AB (ref 11.5–14.5)
WBC: 7.8 10*3/uL (ref 3.6–11.0)

## 2015-10-25 LAB — COMPREHENSIVE METABOLIC PANEL
ALBUMIN: 2.2 g/dL — AB (ref 3.5–5.0)
ALT: 19 U/L (ref 14–54)
ANION GAP: 4 — AB (ref 5–15)
AST: 17 U/L (ref 15–41)
Alkaline Phosphatase: 72 U/L (ref 38–126)
BUN: 12 mg/dL (ref 6–20)
CHLORIDE: 105 mmol/L (ref 101–111)
CO2: 30 mmol/L (ref 22–32)
Calcium: 7.8 mg/dL — ABNORMAL LOW (ref 8.9–10.3)
Creatinine, Ser: 0.55 mg/dL (ref 0.44–1.00)
GFR calc Af Amer: >60 mL/min (ref >60)
GFR calc non Af Amer: >60 mL/min (ref >60)
GLUCOSE: 92 mg/dL (ref 65–99)
POTASSIUM: 3.7 mmol/L (ref 3.5–5.1)
SODIUM: 139 mmol/L (ref 135–145)
Total Bilirubin: 0.6 mg/dL (ref 0.3–1.2)
Total Protein: 5.6 g/dL — ABNORMAL LOW (ref 6.5–8.1)

## 2015-10-25 MED ORDER — BLISTEX MEDICATED EX OINT
TOPICAL_OINTMENT | CUTANEOUS | Status: DC | PRN
  Administered 2015-10-26: 1 via TOPICAL
  Filled 2015-10-25: qty 6.3

## 2015-10-25 MED ORDER — FAMOTIDINE IN NACL 20-0.9 MG/50ML-% IV SOLN
20.0000 mg | Freq: Two times a day (BID) | INTRAVENOUS | Status: DC
  Administered 2015-10-25 – 2015-10-27 (×4): 20 mg via INTRAVENOUS
  Filled 2015-10-25 (×5): qty 50

## 2015-10-25 MED ORDER — CYCLOBENZAPRINE HCL 10 MG PO TABS
10.0000 mg | ORAL_TABLET | Freq: Three times a day (TID) | ORAL | Status: DC
  Administered 2015-10-25 – 2015-11-05 (×32): 10 mg via ORAL
  Filled 2015-10-25 (×33): qty 1

## 2015-10-25 MED ORDER — ACETAMINOPHEN 325 MG PO TABS
650.0000 mg | ORAL_TABLET | Freq: Four times a day (QID) | ORAL | Status: DC
  Administered 2015-10-25 – 2015-11-05 (×42): 650 mg via ORAL
  Filled 2015-10-25 (×43): qty 2

## 2015-10-25 MED ORDER — KETOROLAC TROMETHAMINE 30 MG/ML IJ SOLN: 30.0000 mg | Freq: Four times a day (QID) | INTRAMUSCULAR | Status: DC

## 2015-10-25 NOTE — Evaluation (Signed)
Physical Therapy Evaluation Patient Details Name: Barbara Hawkins MRN: [id number] DOB: Jun 04, 1975 Today's Date: 10/25/2015   History of Present Illness  Pt is a 40 y.o. female with a past medical history of diverticulitis who presents the emergency department with lower abdominal pain. According to the patient 10/15/15 she had a surgery for debridement of a wound to her left leg. The wound is currently covered with a wound VAC dressing. Pt states since that time she has had progressive abdominal pain mostly in the lower abdomen and left lower quadrant.  Pt is now s/p laparotomy, transverse colon resection with colostomy and mucus fistula creation.  Clinical Impression  Pt presents with deficits in strength, mobility, gait, balance, and activity tolerance.  Pt required min A with bed mobility for BLEs and was CGA with transfers and amb for several small steps at EOB using a RW.  Pt will benefit from PT services to address above deficits for decreased caregiver assistance.        Follow Up Recommendations SNF    Equipment Recommendations  Rolling walker with 5" wheels    Recommendations for Other Services       Precautions / Restrictions Precautions Precautions: Fall Restrictions Weight Bearing Restrictions: No      Mobility  Bed Mobility Overal bed mobility: Needs Assistance Bed Mobility: Supine to Sit;Sit to Supine     Supine to sit: Supervision Sit to supine: Min assist   General bed mobility comments: Min A with BLEs into bed with sit to sup  Transfers Overall transfer level: Needs assistance Equipment used: Rolling walker (2 wheeled) Transfers: Sit to/from Stand Sit to Stand: Min guard            Ambulation/Gait Ambulation/Gait assistance: Min guard Ambulation Distance (Feet): 3 Feet Assistive device: Rolling walker (2 wheeled) Gait Pattern/deviations: Decreased step length - left;Decreased step length - right     General Gait Details: Limited amb at  [employer]  Stairs            Wheelchair Mobility    Modified Rankin (Stroke Patients Only)       Balance Overall balance assessment: Needs assistance   Sitting balance-Leahy Scale: Good     Standing balance support: Bilateral upper extremity supported Standing balance-Leahy Scale: Fair                               Pertinent Vitals/Pain Pain Assessment: 0-10 Pain Score: 7  Pain Location: abdominal area Pain Descriptors / Indicators: Constant Pain Intervention(s): Monitored during session (Pt has PCA)    Home Living Family/patient expects to be discharged to:: Private residence Living Arrangements: Children (Pt lives with 40 yo daughter) Available Help at Discharge: Family Type of Home: House Home Access: Stairs to enter Entrance Stairs-Rails: None Entrance Stairs-Number of Steps: 3 Home Layout: One level        Prior Function Level of Independence: Independent         Comments: Ind with Amb without AD, driving, ind with ADLs, no fall history     Hand Dominance   Dominant Hand: Right    Extremity/Trunk Assessment   Upper Extremity Assessment: Overall WFL for tasks assessed           Lower Extremity Assessment: Generalized weakness         Communication   Communication: No difficulties  Cognition Arousal/Alertness: Awake/alert Behavior During Therapy: WFL for tasks assessed/performed Overall Cognitive Status: Within Functional Limits for  tasks assessed                      General Comments      Exercises     Assessment/Plan    PT Assessment Patient needs continued PT services  PT Problem List Decreased strength;Decreased activity tolerance;Decreased mobility;Decreased balance          PT Treatment Interventions DME instruction;Gait training;Stair training;Functional mobility training;Therapeutic activities;Therapeutic exercise;Balance training;Neuromuscular re-education;Patient/family education    PT Goals  (Current goals can be found in the Care Plan section)  Acute Rehab PT Goals Patient Stated Goal: Better endurance PT Goal Formulation: With patient Time For Goal Achievement: 11/07/15 Potential to Achieve Goals: Good    Frequency Min 2X/week   Barriers to discharge        Co-evaluation               End of Session Equipment Utilized During Treatment: Oxygen Activity Tolerance: Patient limited by fatigue;Patient limited by pain Patient left: in bed;with bed alarm set;with call bell/phone within reach;with family/visitor present           Time: 1110-1140 PT Time Calculation (min) (ACUTE ONLY): 30 min   Charges:   PT Evaluation $PT Eval Moderate Complexity: 1 Procedure     PT G Codes:        DElly Modena PT, DPT 10/25/15, 1:24 PM

## 2015-10-25 NOTE — Consult Note (Signed)
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 Peristomal assessment: Intact, clean skin Treatment options for stomal/peristomal skin: Colostomy pouched on clean dry skin Output  Bloody bowel sweat Ostomy pouching: 2pc. Hollister 2 3/4 [phone]([id number]) with drainable pouch and filter.  Removed old pouch, cleaned skin with water.  Cut to fit pouch and placed over stoma at a angle to side for easier emptying while still bedbound. Education provided: [employer] booklets on eating and caring for colostomy reviewed at bedside with pt and mother.  Pt on IV pain medication with moderate 5/10 pain.  Pt expressed fears about caring for her ostomy.  Reassured pt that she would be able to change her own pouch. Expectation to assist with emptying pouch with nursing staff and releasing gas as needed was discussed. [facility] program: No.  Pt still on PCA, NGT, drains.  Pt requires additional teaching prior to discharge by WOCN.  Pouch change Monday, Wednesday and Friday.  Stoma type/location: LUQ mucus fistula Stomal assessment/size:  1 1/2 inches, flush, red, moist Peristomal assessment: Intact skin Treatment options for stomal/peristomal skin: Pouch with 2 pc Hollister drainable pouch. Output bloody, thin liquid Ostomy pouching: 2pc. Hollister  1 3/4 [phone]([id number]) with drainable pouch Education provided: Instructed pt on reason for fistula, over time fistula with stop producing much fluid and she may be able to cover with 4x4 or Band-Aid.  Change pouch Monday, Wednesday, Friday until drainage decreases.  WOC Nurse wound consult note Reason for Consult: Left posterior thigh/knee traumatic non-healing wound Wound type: non-healing wound Pressure Ulcer POA: No Measurement: 6 x 5 x 6 cm Wound  bed: budded beefy red Drainage (amount, consistency, odor) serosanginous, small amount clear, thin, drainage with no odor Periwound: intact Dressing procedure/placement/frequency: Previously had wound vac to wound for extended time.  Wound vac off and pt with moist to dry dressing.  Pt states she usually lays on her belly for vac changes and does not know how she can do that now.  Recommend continue moist to dry dressing as there is not an abundance of drainage.  Wound does not look infected.  If wound vac is made available, it could be accomplished while pt is supine.  Discussed POC with patient and bedside nurse.  Will see pt on Wednesday for additional ostomy education and pouch change.  Thanks Henriette CombsSarah Goldman Birchall BSN, RN, CWOCN Conservative sharp wound debridement (CSWD performed at the bedside):

## 2015-10-25 NOTE — Progress Notes (Signed)
Per Dr. Orvis BrillLoflin place order for patinet to be NPO as they did not have a diet order.

## 2015-10-25 NOTE — Progress Notes (Signed)
40 yr old female with hx of Ehlers-Danlos syndrome and left thigh wound that it previously been debrided now POD# 2 from exploratory laparotomy and transverse colectomy with mucous fistula and colostomy placement for perforated transverse colon. Patient states that the pain in the abdomen is quite significant but PCA does help control it however it wears off quickly. Patient has been up with physical therapy and has gotten to the bedside commode when he walked into the bathroom with assistance. She states that her left thigh wound hurts much less that her abdominal wound and she thinks it's getting better.  Vitals:   10/25/15 1600 10/25/15 1732  BP:  137/90  Pulse:  78  Resp: 16 16  Temp:  98.1 F (36.7 C)   I/O last 3 completed shifts: In: 2135 [I.V.:1705; Other:65; NG/GT:330; IV Piggyback:35] Out: 2130 [Urine:700; Emesis/NG output:1350; Drains:80] No intake/output data recorded.   PE:  Gen: NAD Abd: soft, obese, incision with serosangionus drainage, mucous fistula retracted, colostomy pink and patent with bowel sweat and air in bag Left thigh: 5cm area of healing tissue, without any necrosis   CBC Latest Ref Rng & Units 10/25/2015 10/24/2015 10/23/2015  WBC 3.6 - 11.0 K/uL 7.8 12.9(H) 15.1(H)  Hemoglobin 12.0 - 16.0 g/dL 1.6(X8.9(L) 11.2(L) 11.0(L)  Hematocrit 35.0 - 47.0 % 27.5(L) 34.0(L) 32.6(L)  Platelets 150 - 440 K/uL 217 262 256   CMP Latest Ref Rng & Units 10/25/2015 10/24/2015 10/23/2015  Glucose 65 - 99 mg/dL 92 87 096(E136(H)  BUN 6 - 20 mg/dL 12 13 7   Creatinine 0.44 - 1.00 mg/dL 4.540.55 0.980.77 1.190.64  Sodium 135 - 145 mmol/L 139 137 138  Potassium 3.5 - 5.1 mmol/L 3.7 4.6 3.7  Chloride 101 - 111 mmol/L 105 103 105  CO2 22 - 32 mmol/L 30 25 26   Calcium 8.9 - 10.3 mg/dL 7.8(L) 8.0(L) 8.1(L)  Total Protein 6.5 - 8.1 g/dL 1.4(N5.6(L) - 6.3(L)  Total Bilirubin 0.3 - 1.2 mg/dL 0.6 - 0.5  Alkaline Phos 38 - 126 U/L 72 - 69  AST 15 - 41 U/L 17 - 18  ALT 14 - 54 U/L 19 - 33   A/P:  40 yr old  female  POD# 2 from exploratory laparotomy and transverse colectomy with mucous fistula and colostomy placement  Pain: Scheduled toradol, tylenol and flexeril with PCA prn GI: NG tube still with increased output and slight red tinge, started on Pepcid IV today, will continue NG tube overnight and possibly clamp tomorrow if output continues to decrease, WBC down to normal ID: continue zosyn for peritonitis  Left thigh wound: continue wet to dry dressings BID, will place wound vac later this week  PT: continue working with PT to determine if SNF or rehab required.

## 2015-10-25 NOTE — Care Management (Signed)
Patient readmitted s/p colectomy w end colostomy and m fistula.  Per nursing [facility] wound vac at bedside.  Spoke with Barbara CowerJason from [facility], patient does not need a new wound vac set up.  Patient's family will need to bring vac canister and supplies from home to have at bedside, in the event it is determined that patient will need to have it reapplied. Patient will need home health nursing for ostomy at discharge.  Barbara CowerJason with [facility] notified.

## 2015-10-26 LAB — SURGICAL PATHOLOGY

## 2015-10-26 MED ORDER — FLUCONAZOLE 100 MG PO TABS
150.0000 mg | ORAL_TABLET | Freq: Every day | ORAL | Status: AC
  Administered 2015-10-26 – 2015-11-01 (×7): 150 mg via ORAL
  Filled 2015-10-26 (×5): qty 2
  Filled 2015-10-26: qty 1
  Filled 2015-10-26: qty 2

## 2015-10-26 NOTE — Progress Notes (Signed)
Initial Nutrition Assessment  DOCUMENTATION CODES:   Morbid obesity  INTERVENTION:  -Pt currently does not meet ASPEN and AND criteria for malnutrition. Would not recommend starting TPN until pt unable to take po for > 7 days as risk of TPN outweigh benefits at this time. Albumin has a half-life of 21 days and is strongly affected by stress response and inflammatory process, therefore, do not expect to see an improvement in this lab value during acute hospitalization. Will continue to follow and make further recommendations based on pt status.  -Recommend adding MVI   NUTRITION DIAGNOSIS:   Inadequate oral intake related to altered GI function as evidenced by NPO status.   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  Pt has been up with PT.    Pt with history of Ehlers-Danlos syndrome and left thigh wound that was previously debrided.    Pt reports for the past week decreased intake secondary to emotional frustration regarding physical health even though trying to eat right and exercise. Reports not eating solid food for a day or two but drinking liquids. Reports towards the end of the week decreased intake due to abdominal pain.  Pt reports since March 2017 has been really focusing on eating lean protein (drinking premier protein shakes), vegetables,replacing simple carbohydrates with complex carbohydrates and has been walking to try and loose weight and improve wound healing.  Has gotten frustrated with multiple thigh wound issues and now this hospitalization. Pt reports has also been living with daughter and her husband recently and has been eating  convenience meals.    Pt reports that she has lost 40 pounds since March but has been trying to loose weight (12% wt loss for the last 7 months)    Medications reviewed: D5 NS with KCl at 15025ml/hr,  Labs reviewed: Mag, Phos and K WDL, Hgb 8.9  NG tube in place with 850ml output noted  Nutrition-Focused physical exam completed. Findings are no fat depletion, no muscle depletion, and mild edema.    Diet Order:  Diet NPO time specified Except for: Ice Chips, Sips with Meds  Skin:   (left leg thigh wound, mucus fistula and ostomy noted)  Last BM:  no ostomy output  Height:   Ht Readings from Last 1 Encounters:  10/22/15 5\' 6"  (1.676 m)    Weight: 7% in the last 4 months per wt encounters in system  Wt Readings from Last 1 Encounters:  10/22/15 296 lb (134.3 kg)    Ideal Body Weight:     BMI:  Body mass index is 47.78 kg/m.  Estimated Nutritional Needs:   Kcal:  2000-2600 kcals/d  Protein:  89-118g/d (Using IBW of 59kg)  Fluid:  >/= 2 L/d  EDUCATION NEEDS:   Education needs addressed  [name], RD, LDN [phone] (pager) Weekend/On-Call pager [phone])

## 2015-10-26 NOTE — Consult Note (Signed)
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 Peristomal assessment: Intact, clean skin Treatment options for stomal/peristomal skin: Colostomy pouched on clean dry skin Output  Bloody bowel sweat Ostomy pouching: 2pc. Hollister 2 3/4 (14202)  Intact.  So no change today. MF pouch is loose and will be changed today.  [facility]: No.  Pt still on PCA, NGT, drains.  Pt requires additional teaching prior to discharge by WOCN.  Pouch change Monday, Wednesday and Friday.  Stoma type/location: LUQ mucus fistula Stomal assessment/size:  1 1/2 inches, flush, red, moist Peristomal assessment: Intact skin Treatment options for stomal/peristomal skin: Pouch with 2 pc Hollister drainable pouch. Output bloody, thin liquid Ostomy pouching: 2pc. Hollister  2 1/4" with drainable pouch.  Added barrier ring to improve wear time.  Became soiled with drainage and was loosened.  Education provided: Instructed pt on reason for fistula, over time fistula with stop producing much fluid and she may be able to cover with 4x4 or Band-Aid.  Changed pouch today and will assess later in the week.   WOC Nurse wound consult note Reason for Consult: Left posterior thigh/knee traumatic non-healing wound Wound type: non-healing wound Pressure Ulcer POA: No Measurement: 6 x 5 x 6 cm Wound bed: budded beefy red Drainage (amount, consistency, odor) serosanginous, small amount clear, thin, drainage with no odor Periwound: intact Dressing procedure/placement/frequency:  Recommend continue moist to dry dressing as there is not an abundance of drainage.  Wound does not look infected.  If wound vac is made available, it could be accomplished while pt is supine. *Albumin is 2.2, discussed  low potential for wound healing with low albumin.  Discussed with bedside RN the need for dietary consult.  With NG tube and NPO, may need TPN.  Wound vac would be minimally effective with low albumin.    Discussed POC with patient and bedside nurse.  Will see pt on Wednesday for additional ostomy education and pouch change. WOC team will follow and remain available to patient, medical and nursing teams.  Maple HudsonKaren Dahl Higinbotham RN BSN CWON Pager [phone]

## 2015-10-26 NOTE — Progress Notes (Signed)
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 Patient states that the pain is better but still needing the PCA some.  She had NG tube clamped today and did well without nausea.  She was up and walking and sitting in chair today.   Vitals:   10/26/15 1620 10/26/15 1655  BP:  137/75  Pulse:  90  Resp: 18 16  Temp:  98.2 F (36.8 C)   I/O last 3 completed shifts: In: 4322.8 [I.V.:3977.8; NG/GT:60; IV Piggyback:285] Out: 1397 [Urine:100; Emesis/NG output:1050; Drains:247] No intake/output data recorded.   PE:  Gen: NAD Abd: soft, obese, incision with serosangionus drainage, mucous fistula retracted, colostomy pink and patent with bowel sweat and air in bag Left thigh: 5cm area of healing tissue, without any necrosis   CBC Latest Ref Rng & Units 10/25/2015 10/24/2015 10/23/2015  WBC 3.6 - 11.0 K/uL 7.8 12.9(H) 15.1(H)  Hemoglobin 12.0 - 16.0 g/dL 1.6(X8.9(L) 11.2(L) 11.0(L)  Hematocrit 35.0 - 47.0 % 27.5(L) 34.0(L) 32.6(L)  Platelets 150 - 440 K/uL 217 262 256   CMP Latest Ref Rng & Units 10/25/2015 10/24/2015 10/23/2015  Glucose 65 - 99 mg/dL 92 87 096(E136(H)  BUN 6 - 20 mg/dL 12 13 7   Creatinine 0.44 - 1.00 mg/dL 4.540.55 0.980.77 1.190.64  Sodium 135 - 145 mmol/L 139 137 138  Potassium 3.5 - 5.1 mmol/L 3.7 4.6 3.7  Chloride 101 - 111 mmol/L 105 103 105  CO2 22 - 32 mmol/L 30 25 26   Calcium 8.9 - 10.3 mg/dL 7.8(L) 8.0(L) 8.1(L)  Total Protein 6.5 - 8.1 g/dL 1.4(N5.6(L) - 6.3(L)  Total Bilirubin 0.3 - 1.2 mg/dL 0.6 - 0.5  Alkaline Phos 38 - 126 U/L 72 - 69  AST 15 - 41 U/L 17 - 18  ALT 14 - 54 U/L 19 - 33   A/P:  40 yr old female  POD# 3 from exploratory laparotomy and transverse colectomy with mucous fistula and colostomy placement  Pain: Scheduled toradol, tylenol and flexeril with PCA prn GI: NG tube  discontinued, if she does well overnight will start on clear liquids tomorrow.  ID: continue zosyn for peritonitis  Left thigh wound: continue wet to dry dressings BID, will place wound vac later this week  PT: continue working with PT to determine if SNF or rehab required.

## 2015-10-26 NOTE — Progress Notes (Signed)
Physical Therapy Treatment Patient Details Name: Barbara Hawkins MRN: [id number] DOB: Jan 07, 1976 Today's Date: 10/26/2015    History of Present Illness Pt is a 40 y.o. female with a past medical history of diverticulitis who presents the emergency department with lower abdominal pain. According to the patient 10/15/15 she had a surgery for debridement of a wound to her left leg. The wound is currently covered with a wound VAC dressing. Asians states since that time she has had progressive abdominal pain mostly in the lower abdomen and left lower quadrant.  Pt is now s/p laparotomy, transverse colon resection with colostomy and mucus fistula creation.    PT Comments    Pt initially sleeping; easily awoken, but remains groggy. Pt complains of 6/10 abdominal pain especially left lower quadrant feeling she pulled tube. Pt states nursing checked and found all to be intact. Pt refuses out of bed/ambulation at this time noting she walked earlier several times. Pt agreeable to limited supine bed exercises and whines/moans with all exercises. Pt encouraged to perform exercises several times a day; pt states understanding. Continue PT to progress strength, endurance to improve all functional mobility.   Follow Up Recommendations  SNF     Equipment Recommendations  Rolling walker with 5" wheels    Recommendations for Other Services       Precautions / Restrictions Precautions Precautions: Fall Restrictions Weight Bearing Restrictions: No    Mobility  Bed Mobility               General bed mobility comments: Refused out of bed at this time; states she walked with nsg earlier today and has been to bedside commode  Transfers                    Ambulation/Gait                 Stairs            Wheelchair Mobility    Modified Rankin (Stroke Patients Only)       Balance                                    Cognition Arousal/Alertness:  Lethargic Behavior During Therapy: Restless;Agitated (whines/moans) Overall Cognitive Status: Within Functional Limits for tasks assessed                      Exercises General Exercises - Lower Extremity Ankle Circles/Pumps: AROM;Both;20 reps;Supine Quad Sets: Strengthening;Both;10 reps;Supine Gluteal Sets: Strengthening;Both;10 reps;Supine Short Arc Quad: AROM;Both;10 reps;Supine Heel Slides: AROM;Both;10 reps;Supine Hip ABduction/ADduction: AROM;Both;10 reps;Supine Straight Leg Raises: Strengthening;Right;5 reps;Supine (2 sets)    General Comments        Pertinent Vitals/Pain Pain Assessment: 0-10 Pain Score: 6  Pain Location: Abdomen especially L lower quadrant; pt states she hurt self by tube getting pulled; states nsg checked and said it is okay Pain Intervention(s): Limited activity within patient's tolerance;Monitored during session    Home Living                      Prior Function            PT Goals (current goals can now be found in the care plan section) Progress towards PT goals: Progressing toward goals (slowly)    Frequency    Min 2X/week      PT Plan Current plan remains appropriate  Co-evaluation             End of Session Equipment Utilized During Treatment: Oxygen Activity Tolerance: Patient limited by fatigue;Patient limited by pain Patient left: in bed;with call bell/phone within reach;with bed alarm set     Time: [id number] PT Time Calculation (min) (ACUTE ONLY): 17 min  Charges:  $Therapeutic Exercise: 8-22 mins                    G Codes:      Scot DockHeidi E Gean Laursen, PTA 10/26/2015, 3:07 PM

## 2015-10-27 MED ORDER — LORAZEPAM 0.5 MG PO TABS
0.5000 mg | ORAL_TABLET | Freq: Four times a day (QID) | ORAL | Status: DC | PRN
  Administered 2015-10-27: 0.5 mg via ORAL
  Filled 2015-10-27: qty 1

## 2015-10-27 MED ORDER — HYDROMORPHONE HCL 1 MG/ML IJ SOLN
0.5000 mg | INTRAMUSCULAR | Status: DC | PRN
  Administered 2015-10-28: 0.5 mg via INTRAVENOUS
  Filled 2015-10-27: qty 1

## 2015-10-27 MED ORDER — PANTOPRAZOLE SODIUM 40 MG PO TBEC
40.0000 mg | DELAYED_RELEASE_TABLET | Freq: Every day | ORAL | Status: DC
  Administered 2015-10-27 – 2015-11-05 (×10): 40 mg via ORAL
  Filled 2015-10-27 (×10): qty 1

## 2015-10-27 MED ORDER — OXYCODONE HCL 5 MG PO TABS
10.0000 mg | ORAL_TABLET | ORAL | Status: DC | PRN
Start: 2015-10-27 — End: 2015-11-05
  Administered 2015-10-27 – 2015-11-05 (×42): 10 mg via ORAL
  Filled 2015-10-27 (×28): qty 2
  Filled 2015-10-27: qty 1
  Filled 2015-10-27 (×14): qty 2

## 2015-10-27 NOTE — Progress Notes (Signed)
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 Patient did well with her NG tube clamped and no nausea.  She did have some stool from mucous fistula and output from ostomy.   Vitals:   10/27/15 0800 10/27/15 1204  BP:  126/68  Pulse:  80  Resp: 18 18  Temp:  97.9 F (36.6 C)   I/O last 3 completed shifts: In: 3191.8 [P.O.:60; I.V.:2801.8; NG/GT:30; IV Piggyback:300] Out: 752 [Emesis/NG output:450; Drains:302] Total I/O In: 2198 [P.O.:240; I.V.:1908; IV Piggyback:50] Out: -    PE:  Gen: NAD Abd: soft, obese, incision with serosangionus drainage, mucous fistula retracted, colostomy pink and patent with bowel sweat and air in bag Left thigh: 5cm area of healing tissue, without any necrosis   CBC Latest Ref Rng & Units 10/25/2015 10/24/2015 10/23/2015  WBC 3.6 - 11.0 K/uL 7.8 12.9(H) 15.1(H)  Hemoglobin 12.0 - 16.0 g/dL 1.6(X) 11.2(L) 11.0(L)  Hematocrit 35.0 - 47.0 % 27.5(L) 34.0(L) 32.6(L)  Platelets 150 - 440 K/uL 217 262 256   CMP Latest Ref Rng & Units 10/25/2015 10/24/2015 10/23/2015  Glucose 65 - 99 mg/dL 92 87 096(E)  BUN 6 - 20 mg/dL Creatinine 0.44 - 1.00 mg/dL 4.54 0.98 1.19  Sodium 135 - 145 mmol/L 139 137 138  Potassium 3.5 - 5.1 mmol/L 3.7 4.6 3.7  Chloride 101 - 111 mmol/L 105 103 105  CO2 22 - 32 mmol/L Calcium 8.9 - 10.3 mg/dL 7.8(L) 8.0(L) 8.1(L)  Total Protein 6.5 - 8.1 g/dL 1.4(N) - 6.3(L)  Total Bilirubin 0.3 - 1.2 mg/dL 0.6 - 0.5  Alkaline Phos 38 - 126 U/L 72 - 69  AST 15 - 41 U/L 17 - 18  ALT 14 - 54 U/L 19 - 33   A/P:  40 yr old female  POD# 4 from exploratory laparotomy and transverse colectomy with mucous fistula and colostomy placement  Pain: Scheduled toradol, tylenol and flexeril with oxycodone and diluadid prns WG:NFAOZ liquid  diet ID: continue zosyn for peritonitis  Left thigh wound: continue wet to dry dressings BID, will place wound vac later this week  PT: continue working with PT to determine if SNF or rehab required.

## 2015-10-28 MED ORDER — BOOST / RESOURCE BREEZE PO LIQD
1.0000 | Freq: Three times a day (TID) | ORAL | Status: DC
  Administered 2015-10-28 – 2015-11-05 (×19): 1 via ORAL

## 2015-10-28 NOTE — Progress Notes (Signed)
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 Patient did well with clear liquids but does have some nausea with getting up.  Not much from ostomy today.   Vitals:   10/28/15 1043 10/28/15 1222  BP:  (!) 144/82  Pulse: 91 91  Resp:  16  Temp:  97.7 F (36.5 C)   I/O last 3 completed shifts: In: 4679 [P.O.:1060; I.V.:3419; IV Piggyback:200] Out: 363 [Urine:3; Drains:300; Stool:60] Total I/O In: 525 [P.O.:480; IV Piggyback:45] Out: 190 [Drains:140; Stool:50]   PE:  Gen: NAD Abd: soft, obese, incision with serosangionus drainage, mucous fistula retracted but with stool in bag, colostomy pink and patent with bowel sweat and air in bag Left thigh: 5cm area of healing tissue, without any necrosis   CBC Latest Ref Rng & Units 10/25/2015 10/24/2015 10/23/2015  WBC 3.6 - 11.0 K/uL 7.8 12.9(H) 15.1(H)  Hemoglobin 12.0 - 16.0 g/dL 1.6(X8.9(L) 11.2(L) 11.0(L)  Hematocrit 35.0 - 47.0 % 27.5(L) 34.0(L) 32.6(L)  Platelets 150 - 440 K/uL 217 262 256   CMP Latest Ref Rng & Units 10/25/2015 10/24/2015 10/23/2015  Glucose 65 - 99 mg/dL 92 87 096(E136(H)  BUN 6 - 20 mg/dL 12 13 7   Creatinine 0.44 - 1.00 mg/dL 4.540.55 0.980.77 1.190.64  Sodium 135 - 145 mmol/L 139 137 138  Potassium 3.5 - 5.1 mmol/L 3.7 4.6 3.7  Chloride 101 - 111 mmol/L 105 103 105  CO2 22 - 32 mmol/L 30 25 26   Calcium 8.9 - 10.3 mg/dL 7.8(L) 8.0(L) 8.1(L)  Total Protein 6.5 - 8.1 g/dL 1.4(N5.6(L) - 6.3(L)  Total Bilirubin 0.3 - 1.2 mg/dL 0.6 - 0.5  Alkaline Phos 38 - 126 U/L 72 - 69  AST 15 - 41 U/L 17 - 18  ALT 14 - 54 U/L 19 - 33   A/P:  40 yr old female  POD# 5 from exploratory laparotomy and transverse colectomy with mucous fistula and colostomy placement  Pain: Scheduled toradol, tylenol and flexeril with oxycodone and diluadid prns WG:NFAOZGI:clear liquid  diet, add protein shake ID: continue zosyn for peritonitis  Left thigh wound: continue wet to dry dressings BID, will place wound vac later this week  PT: recommending SNF but patient without insurance

## 2015-10-28 NOTE — Progress Notes (Signed)
Physical Therapy Treatment Patient Details Name: Barbara Hawkins MRN: [id number] DOB: 09/04/1975 Today's Date: 10/28/2015    History of Present Illness Pt is a 40 y.o. female with a past medical history of diverticulitis who presents the emergency department with lower abdominal pain. According to the patient 10/15/15 she had a surgery for debridement of a wound to her left leg. The wound is currently covered with a wound VAC dressing. Asians states since that time she has had progressive abdominal pain mostly in the lower abdomen and left lower quadrant.  Pt is now s/p laparotomy, transverse colon resection with colostomy and mucus fistula creation.    PT Comments    Pt agreeable to PT; continues with pain in abdomen; sighs with exasperation throughout session. PT ambulates to bathroom with supervision and performs toileting with Mod I. Progress ambulation to 200 ft. Pt receives up in chair; refuses alarm and nursing assistant notified. Continue PT to progress strength and endurance to improve all functional mobility.   Follow Up Recommendations  SNF     Equipment Recommendations  Rolling walker with 5" wheels    Recommendations for Other Services       Precautions / Restrictions Precautions Precautions: Fall Restrictions Weight Bearing Restrictions: No    Mobility  Bed Mobility Overal bed mobility: Modified Independent Bed Mobility: Supine to Sit     Supine to sit: Modified independent (Device/Increase time)     General bed mobility comments: Increased time and effort  Transfers Overall transfer level: Modified independent Equipment used: Rolling walker (2 wheeled) Transfers: Sit to/from Stand Sit to Stand: Modified independent (Device/Increase time)         General transfer comment: Slow to rise, steady and safe use of hands from bed, commode and recliner  Ambulation/Gait Ambulation/Gait assistance: Supervision Ambulation Distance (Feet): 200 Feet Assistive  device: Rolling walker (2 wheeled) Gait Pattern/deviations: Step-through pattern;WFL(Within Functional Limits) Gait velocity: reduced Gait velocity interpretation: Below normal speed for age/gender General Gait Details: slow, but steady gait, reciprocal. No LOB; notes fatigue and increased abdominal pain with ambulation   Stairs            Wheelchair Mobility    Modified Rankin (Stroke Patients Only)       Balance Overall balance assessment: Needs assistance         Standing balance support: Bilateral upper extremity supported Standing balance-Leahy Scale: Fair (Fair +)                      Cognition Arousal/Alertness: Awake/alert Behavior During Therapy:  (frustrated) Overall Cognitive Status: Within Functional Limits for tasks assessed                      Exercises General Exercises - Lower Extremity Long Arc Quad: AROM;Both;10 reps;Seated (2 sets) Hip Flexion/Marching: AROM;Both;10 reps;Seated (2 sets) Toe Raises: AROM;Both;20 reps;Seated Heel Raises: AROM;Both;20 reps;Seated Other Exercises Other Exercises: Modified I/S    General Comments        Pertinent Vitals/Pain Pain Assessment: 0-10 Pain Score: 7  (with ambulation; 4/10 at rest) Pain Location: Abdomen Pain Intervention(s): Monitored during session;Premedicated before session;Repositioned    Home Living                      Prior Function            PT Goals (current goals can now be found in the care plan section) Progress towards PT goals: Progressing toward goals  Frequency    Min 2X/week      PT Plan Current plan remains appropriate    Co-evaluation             End of Session Equipment Utilized During Treatment:  (refused gait belt due to abdominal/trunk pain) Activity Tolerance: Patient limited by fatigue;Patient limited by pain Patient left: in chair;with call bell/phone within reach;Other (comment) (refused alarm; nsg asst notified)      Time: 8119-14781043-1113 PT Time Calculation (min) (ACUTE ONLY): 30 min  Charges:  $Gait Training: 8-22 mins $Therapeutic Exercise: 8-22 mins                    G Codes:      Scot DockHeidi E Barnes, PTA 10/28/2015, 1:17 PM

## 2015-10-28 NOTE — Progress Notes (Signed)
Patient refused dressing change to LLE requesting that wound care perform the dressing change in the morning and possible place wound VAC per a discussion patient reports having with wound care on 10/27/2015.

## 2015-10-29 LAB — CREATININE, SERUM
Creatinine, Ser: 0.61 mg/dL (ref 0.44–1.00)
GFR calc non Af Amer: >60 mL/min (ref >60)

## 2015-10-29 NOTE — Progress Notes (Signed)
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 Patient did well with clear liquids, no nausea today.  She has had some small amount of output from ostomy.   Vitals:   10/29/15 0828 10/29/15 1222  BP: 137/77 (!) 145/85  Pulse: 94 (!) 104  Resp: 18 18  Temp: 98 F (36.7 C) 98.4 F (36.9 C)   I/O last 3 completed shifts: In: 2570 [P.O.:1470; I.V.:855; IV Piggyback:245] Out: 493 [Urine:3; Drains:380; Stool:110] Total I/O In: -  Out: 100 [Drains:100]  PE:  Gen: NAD Abd: soft, obese, incision with serosangionus drainage, mucous fistula retracted but with stool in bag, colostomy pink and patent with ostomy and air in bag Left thigh: 5cm area of healing tissue, without any necrosis   CBC Latest Ref Rng & Units 10/25/2015 10/24/2015 10/23/2015  WBC 3.6 - 11.0 K/uL 7.8 12.9(H) 15.1(H)  Hemoglobin 12.0 - 16.0 g/dL 9.6(E8.9(L) 11.2(L) 11.0(L)  Hematocrit 35.0 - 47.0 % 27.5(L) 34.0(L) 32.6(L)  Platelets 150 - 440 K/uL 217 262 256   CMP Latest Ref Rng & Units 10/29/2015 10/25/2015 10/24/2015  Glucose 65 - 99 mg/dL - 92 87  BUN 6 - 20 mg/dL - 12 13  Creatinine 4.540.44 - 1.00 mg/dL 0.980.61 1.190.55 1.470.77  Sodium 135 - 145 mmol/L - 139 137  Potassium 3.5 - 5.1 mmol/L - 3.7 4.6  Chloride 101 - 111 mmol/L - 105 103  CO2 22 - 32 mmol/L - 30 25  Calcium 8.9 - 10.3 mg/dL - 7.8(L) 8.0(L)  Total Protein 6.5 - 8.1 g/dL - 5.6(L) -  Total Bilirubin 0.3 - 1.2 mg/dL - 0.6 -  Alkaline Phos 38 - 126 U/L - 72 -  AST 15 - 41 U/L - 17 -  ALT 14 - 54 U/L - 19 -   A/P:  40 yr old female  POD# 6 from exploratory laparotomy and transverse colectomy with mucous fistula and colostomy placement  Pain: Scheduled toradol, tylenol and flexeril with oxycodone and diluadid prns WG:NFAOGI:full liquid diet and protein shakes ID: continue zosyn for  peritonitis  Left thigh wound: continue wet to dry dressings BID, will place wound vac later this week  PT: recommending SNF but patient without insurance

## 2015-10-29 NOTE — Care Management (Signed)
Patient now tolerating full liquid diet.  Patient home wound vac at bedside with new canister and foam.  Patient's daughter to bring new tubing set from home.  Patient does not need a new vac, new supplies just need to be available when medically ready to reapply.  At discharge patient will also need nursing services for ostomy care.  Patient currently receiving home nursing through charity services at [facility].

## 2015-10-30 NOTE — Progress Notes (Signed)
40 yr old female with hx of Ehlers-Danlos syndrome and left thigh wound that it previously been debrided now POD# 7 from exploratory laparotomy and transverse colectomy with mucous fistula and colostomy placement for perforated transverse colon. Patient did well with full liquids.  She had some output from the ostomy yesterday.  She has been moving around.  She states abdominal pain improving, she can now feel the leg pain again but states that it is not bad.   Vitals:   10/29/15 2019 10/30/15 0534  BP: 128/81 126/69  Pulse: 92 92  Resp: 18 18  Temp: 98.4 F (36.9 C) 98.5 F (36.9 C)   I/O last 3 completed shifts: In: 580 [P.O.:480; IV Piggyback:100] Out: 337 [Urine:2; Drains:335] Total I/O In: 750 [P.O.:750] Out: -   PE:  Gen: NAD Abd: soft, obese, incision with serosangionus drainage, mucous fistula retracted but with stool in bag, colostomy pink and patent stool and air in bag Left thigh: 5cm area of healing tissue, without any necrosis   CBC Latest Ref Rng & Units 10/25/2015 10/24/2015 10/23/2015  WBC 3.6 - 11.0 K/uL 7.8 12.9(H) 15.1(H)  Hemoglobin 12.0 - 16.0 g/dL 1.6(X8.9(L) 11.2(L) 11.0(L)  Hematocrit 35.0 - 47.0 % 27.5(L) 34.0(L) 32.6(L)  Platelets 150 - 440 K/uL 217 262 256   CMP Latest Ref Rng & Units 10/29/2015 10/25/2015 10/24/2015  Glucose 65 - 99 mg/dL - 92 87  BUN 6 - 20 mg/dL - 12 13  Creatinine 0.960.44 - 1.00 mg/dL 0.450.61 4.090.55 8.110.77  Sodium 135 - 145 mmol/L - 139 137  Potassium 3.5 - 5.1 mmol/L - 3.7 4.6  Chloride 101 - 111 mmol/L - 105 103  CO2 22 - 32 mmol/L - 30 25  Calcium 8.9 - 10.3 mg/dL - 7.8(L) 8.0(L)  Total Protein 6.5 - 8.1 g/dL - 5.6(L) -  Total Bilirubin 0.3 - 1.2 mg/dL - 0.6 -  Alkaline Phos 38 - 126 U/L - 72 -  AST 15 - 41 U/L - 17 -  ALT 14 - 54 U/L - 19 -   A/P:  40 yr old female  POD# 7 from exploratory laparotomy and transverse colectomy with mucous fistula and colostomy placement  Pain: Scheduled toradol, tylenol and flexeril with oxycodone and  diluadid prns BJ:[id number]:continue full liquid diet and protein shakes until more from ostomy ID: continue zosyn for peritonitis, will recheck labs in AM, if WBC normalized may d/c abx Left thigh wound: continue wet to dry dressings BID, will place wound vac before she goes home PT: recommending SNF but patient without insurance

## 2015-10-31 LAB — CBC
HCT: 28.1 % — ABNORMAL LOW (ref 35.0–47.0)
Hemoglobin: 9.1 g/dL — ABNORMAL LOW (ref 12.0–16.0)
MCH: 27.5 pg (ref 26.0–34.0)
MCHC: 32.4 g/dL (ref 32.0–36.0)
MCV: 85 fL (ref 80.0–100.0)
PLATELETS: 281 10*3/uL (ref 150–440)
RBC: 3.31 MIL/uL — ABNORMAL LOW (ref 3.80–5.20)
RDW: 16.3 % — AB (ref 11.5–14.5)
WBC: 5.9 10*3/uL (ref 3.6–11.0)

## 2015-10-31 LAB — BASIC METABOLIC PANEL
ANION GAP: 6 (ref 5–15)
BUN: <5 mg/dL — ABNORMAL LOW (ref 6–20)
CALCIUM: 7.8 mg/dL — AB (ref 8.9–10.3)
CO2: 28 mmol/L (ref 22–32)
CREATININE: 0.49 mg/dL (ref 0.44–1.00)
Chloride: 103 mmol/L (ref 101–111)
Glucose, Bld: 101 mg/dL — ABNORMAL HIGH (ref 65–99)
Potassium: 3.3 mmol/L — ABNORMAL LOW (ref 3.5–5.1)
SODIUM: 137 mmol/L (ref 135–145)

## 2015-10-31 MED ORDER — KETOROLAC TROMETHAMINE 30 MG/ML IJ SOLN
30.0000 mg | Freq: Four times a day (QID) | INTRAMUSCULAR | Status: AC
Start: 2015-10-31 — End: 2015-11-05
  Administered 2015-10-31 – 2015-11-04 (×19): 30 mg via INTRAVENOUS
  Filled 2015-10-31 (×20): qty 1

## 2015-10-31 NOTE — Progress Notes (Signed)
40 yr old female with hx of Ehlers-Danlos syndrome and left thigh wound that it previously been debrided now POD# 8 from exploratory laparotomy and transverse colectomy with mucous fistula and colostomy placement for perforated transverse colon. Patient did well with full liquids.  She had increased output from ostomy.  She was up sitting in chair yesterday, took shower and walked in hallway with assistance.   Vitals:   10/30/15 2036 10/31/15 0611  BP: 122/81 120/72  Pulse: 97 90  Resp: 16 16  Temp: 97.7 F (36.5 C) 98.2 F (36.8 C)   I/O last 3 completed shifts: In: 1025.8 [P.O.:750; IV Piggyback:275.8] Out: 117 [Urine:2; Drains:115] No intake/output data recorded.  PE:  Gen: NAD Abd: soft, obese, incision with serosangionus drainage penrose drain removed, mucous fistula retracted but with stool in bag, colostomy pink and patent stool and air in bag Left thigh: 5cm area of healing tissue, without any necrosis   CBC Latest Ref Rng & Units 10/31/2015 10/25/2015 10/24/2015  WBC 3.6 - 11.0 K/uL 5.9 7.8 12.9(H)  Hemoglobin 12.0 - 16.0 g/dL 9.6(E9.1(L) 8.9(L) 11.2(L)  Hematocrit 35.0 - 47.0 % 28.1(L) 27.5(L) 34.0(L)  Platelets 150 - 440 K/uL 281 217 262   CMP Latest Ref Rng & Units 10/31/2015 10/29/2015 10/25/2015  Glucose 65 - 99 mg/dL 454(U101(H) - 92  BUN 6 - 20 mg/dL <9(W<5(L) - 12  Creatinine 0.44 - 1.00 mg/dL 1.190.49 1.470.61 8.290.55  Sodium 135 - 145 mmol/L 137 - 139  Potassium 3.5 - 5.1 mmol/L 3.3(L) - 3.7  Chloride 101 - 111 mmol/L 103 - 105  CO2 22 - 32 mmol/L 28 - 30  Calcium 8.9 - 10.3 mg/dL 7.8(L) - 7.8(L)  Total Protein 6.5 - 8.1 g/dL - - 5.6(L)  Total Bilirubin 0.3 - 1.2 mg/dL - - 0.6  Alkaline Phos 38 - 126 U/L - - 72  AST 15 - 41 U/L - - 17  ALT 14 - 54 U/L - - 19   A/P:  40 yr old female  POD# 8 from exploratory laparotomy and transverse colectomy with mucous fistula and colostomy placement  Pain: Scheduled toradol, tylenol and flexeril with oxycodone and diluadid prns FA:[id number]:advance to  regular diet today, continue protein supplement TID, d/c Left abdominal drain today ID:WBC normalized with good bowel function, will d/c Zosyn today Left thigh wound: continue wet to dry dressings BID, will place wound vac before she goes home PT: recommending SNF but patient without insurance will need a few more days and teaching with new ostomies prior to discharge

## 2015-10-31 NOTE — Plan of Care (Signed)
Problem: Pain Managment: Goal: General experience of comfort will improve Outcome: Progressing Patient resting quietly between care this shift.

## 2015-11-01 NOTE — Plan of Care (Signed)
Problem: Physical Regulation: Goal: Ability to maintain clinical measurements within normal limits will improve Outcome: Progressing Pt has ambulated around the nursing station and did very well with minimal assistance.

## 2015-11-01 NOTE — Progress Notes (Signed)
Physical Therapy Treatment Patient Details Name: Barbara Hawkins MRN: [id number] DOB: December 25, 1975 Today's Date: 11/01/2015    History of Present Illness Pt is a 40 y.o. female with a past medical history of diverticulitis who presents the emergency department with lower abdominal pain. According to the patient 10/15/15 she had a surgery for debridement of a wound to her left leg. The wound is currently covered with a wound VAC dressing. Asians states since that time she has had progressive abdominal pain mostly in the lower abdomen and left lower quadrant.  Pt is now s/p laparotomy, transverse colon resection with colostomy and mucus fistula creation.    PT Comments    Pt restless due to pain and decreased desire for PT safety precautions (refused use of gait belt). Pt requires increased time and HOB elevated for bed mobility. Pt is supervision/min guard for transfers and ambulation 290 ft using RW . Pt demonstrated ability to tolerate moderate balance challenges with ambulation without LOB. Stairs not completed at this time due to pt declining the use of a gait belt for safety. Pt at this time is safe to return home and will benefit from continued PT to increase functional mobility without an assistive device in order to return to baseline mobility.    Follow Up Recommendations  Home health PT     Equipment Recommendations  Rolling walker with 5" wheels    Recommendations for Other Services       Precautions / Restrictions Precautions Precautions: Fall Restrictions Weight Bearing Restrictions: No    Mobility  Bed Mobility Overal bed mobility: Modified Independent Bed Mobility: Supine to Sit     Supine to sit: Modified independent (Device/Increase time);HOB elevated     General bed mobility comments: increased time and effort  Transfers Overall transfer level: Needs assistance Equipment used: Rolling walker (2 wheeled) Transfers: Sit to/from Stand Sit to Stand: Supervision          General transfer comment: Pt refusing gait belt at this time; no LOB or vc's required  Ambulation/Gait Ambulation/Gait assistance: Min guard;+2 safety/equipment (Pt refusing gait belt; +2 for safety only) Ambulation Distance (Feet): 290 Feet Assistive device: Rolling walker (2 wheeled) Gait Pattern/deviations: Step-through pattern Gait velocity: slightly decreased    General Gait Details: No LOB with balance challenges; increased abdominal pain with ambulation requiring 1 standing rest break.    Stairs Stairs:  (deferred at this time due to pt choosing to not don the gait belt for stairs and reporting "we just won't do them")          Wheelchair Mobility    Modified Rankin (Stroke Patients Only)       Balance Overall balance assessment: Modified Independent Sitting-balance support: Feet supported;Bilateral upper extremity supported Sitting balance-Leahy Scale: Good       Standing balance-Leahy Scale: Good Standing balance comment: With use of RW pt able to complete vertical and horizontal head turns; gait speed changes; stop and turn (slightly increased time to complete with large space to turn); and weaving through obstacles without any assistance or LOB                    Cognition Arousal/Alertness: Awake/alert Behavior During Therapy: Restless Overall Cognitive Status: Within Functional Limits for tasks assessed                      Exercises      General Comments General comments (skin integrity, edema, etc.): Pt is agreeable to PT  session.       Pertinent Vitals/Pain Pain Assessment: 0-10 Pain Score: 8  (8/10 with activity; 4/10 at beginning of session; 6/10 at end of session) Pain Location: Abdomen Pain Descriptors / Indicators: Constant Pain Intervention(s): Limited activity within patient's tolerance;Monitored during session;Premedicated before session;Repositioned  O2 98% or greater throughout session on room air. HR 83-94  bpm throughout session.     Home Living                      Prior Function            PT Goals (current goals can now be found in the care plan section) Acute Rehab PT Goals Patient Stated Goal: Decreased pain PT Goal Formulation: With patient Time For Goal Achievement: 11/07/15 Potential to Achieve Goals: Good Progress towards PT goals: Progressing toward goals    Frequency    Min 2X/week      PT Plan Discharge plan needs to be updated    Co-evaluation             End of Session Equipment Utilized During Treatment:  (Pt refused gait belt throughout session) Activity Tolerance: Patient limited by pain Patient left: in chair;with call bell/phone within reach;with chair alarm set     Time: [id number] PT Time Calculation (min) (ACUTE ONLY): 25 min  Charges:                       G CodesAlbertina Senegal:      Jameisha Stofko, SPT 11/01/2015, 1:02 PM

## 2015-11-01 NOTE — Progress Notes (Signed)
11/01/2015  Subjective: Patient is 9 Days Post-Op from expiratory laparotomy and transverse colectomy with mucous fistula and colostomy placement for perforated transverse colon. She tolerated her regular diet with no complications. Her pain is doing well and her ostomy continues to have good bowel function.  Vital signs: Temp:  [97.7 F (36.5 C)-98.2 F (36.8 C)] 98.2 F (36.8 C) (10/09 0450) Pulse Rate:  [79-98] 86 (10/09 0450) Resp:  [17-20] 18 (10/09 0450) BP: (106-142)/(60-96) 118/65 (10/09 0450) SpO2:  [97 %-100 %] 99 % (10/09 0450)   Intake/Output: 10/08 0701 - 10/09 0700 In: 0  Out: 420 [Drains:20; Stool:400] Last BM Date: 10/28/15  Physical Exam: Constitutional: No acute distress Cardiac:  Regular rhythm and rate Pulm: No respiratory distress with lungs clear bilaterally Abdomen: Soft, obese, nondistended, appropriate tenderness to palpation. Right-sided end colostomy with pink mucosa and stool in the bag. Left-sided mucous fistula pink and viable with some mucus liquid in the bag. Skin: Left lower extremity wound with no further erythema no evidence of necrosis and wet to dry dressing.  Labs:   Recent Labs  10/31/15 0615  WBC 5.9  HGB 9.1*  HCT 28.1*  PLT 281    Recent Labs  10/31/15 0615  NA 137  K 3.3*  CL 103  CO2 28  GLUCOSE 101*  BUN <5*  CREATININE 0.49  CALCIUM 7.8*   No results for input(s): LABPROT, INR in the last 72 hours.  Imaging: No results found.  Assessment/Plan: This a 40 year old female postop day 9 status post exploratory laparotomy with transverse colectomy with end colostomy and mucous fistula creation.  -Continue pain regimen with Toradol Tylenol Flexeril and oxycodone. -Continue regular diet with protein supplement 3 times a day. -Continue wet-to-dry dressings of the left lower extremity. -Continue ambulation with assistance and strength conditioning with PT.   Howie IllJose Luis Barbara Suto, MD [facility]

## 2015-11-01 NOTE — Progress Notes (Signed)
Nutrition Follow-up  DOCUMENTATION CODES:   Morbid obesity  INTERVENTION:  -Encourage po intake especially of high protein foods -Discussed Colostomy Medical Nutrition Therapy from [facility].  Handout provided.  Questions answered.  Expect good compliance   NUTRITION DIAGNOSIS:   Inadequate oral intake related to altered GI function as evidenced by NPO status.    GOAL:   Patient will meet greater than or equal to 90% of their needs    MONITOR:   Diet advancement, Labs, Weight trends  REASON FOR ASSESSMENT:   Consult Assessment of nutrition requirement/status  ASSESSMENT:      Pt eating more and tolerating regular diet.  Reports that she is drinking boost breeze TID. Noted per I and O sheet intake 100% of meals  Medications reviewed:  Labs reviewed: K 3.3, glucose 101    Diet Order:  Diet regular Room service appropriate? Yes; Fluid consistency: Thin  Skin:   (left leg thigh wound, mucus fistula and ostomy noted)  Last BM:  no ostomy output  Height:   Ht Readings from Last 1 Encounters:  10/22/15 5\' 6"  (1.676 m)    Weight:   Wt Readings from Last 1 Encounters:  10/22/15 296 lb (134.3 kg)    Ideal Body Weight:     BMI:  Body mass index is 47.78 kg/m.  Estimated Nutritional Needs:   Kcal:  2000-2600 kcals/d  Protein:  89-118g/d (Using IBW of 59kg)  Fluid:  >/= 2 L/d  EDUCATION NEEDS:   Education needs addressed  Barbara Hawkins B. Freida BusmanAllen, RD, LDN [phone] (pager) Weekend/On-Call pager [phone])

## 2015-11-02 NOTE — Progress Notes (Signed)
Physical Therapy Treatment Patient Details Name: Barbara Hawkins MRN: [id number] DOB: [birth year] Today's Date: 11/02/2015    History of Present Illness Pt is a 40 y.o. female with a past medical history of diverticulitis who presents the emergency department with lower abdominal pain. According to the patient 10/15/15 she had a surgery for debridement of a wound to her left leg. The wound is currently covered with a wound VAC dressing. Asians states since that time she has had progressive abdominal pain mostly in the lower abdomen and left lower quadrant.  Pt is now s/p laparotomy, transverse colon resection with colostomy and mucus fistula creation.    PT Comments    Pt shows good effort despite feeling generally poorly.  She has some dizziness with ambulation and occasionally needs to use the rail in the hall for stability, but overall she is safe w/o AD and her O2 remains in the 90s with HR as high as 130 during the effort.  Pt overall will be able to safely go home but is still lacking confidence and did have more fatigue than expected.   Follow Up Recommendations  Home health PT     Equipment Recommendations       Recommendations for Other Services       Precautions / Restrictions Precautions Precautions: Fall Restrictions Weight Bearing Restrictions: No    Mobility  Bed Mobility Overal bed mobility: Modified Independent Bed Mobility: Supine to Sit;Sit to Supine     Supine to sit: Supervision Sit to supine: Supervision      Transfers Overall transfer level: Modified independent Equipment used: None Transfers: Sit to/from Stand Sit to Stand: Modified independent (Device/Increase time)         General transfer comment: Pt able to get up to standing with good safety despite some apparent lack of confidence  Ambulation/Gait Ambulation/Gait assistance: Supervision;Min guard Ambulation Distance (Feet): 200 Feet Assistive device: None       General Gait Details:  Pt with some lightheadedness and general unsteadiness, but ultimately was safe.  She did need 2 standing rest breaks secondary to dizziness and her HR increased as high as 130 during the effort.  Pt with some SOB, but seemed to stay at ~90% on room air despite some fatigue   Stairs Stairs: Yes Stairs assistance: Min guard Stair Management: One rail Right Number of Stairs: 4 General stair comments: Pt able to negotiate up/down steps fairly well, but again showed some anxiety with the effort along with generally feeling of fatigue and lightheadedness.  Wheelchair Mobility    Modified Rankin (Stroke Patients Only)       Balance Overall balance assessment: Modified Independent   Sitting balance-Leahy Scale: Good       Standing balance-Leahy Scale: Good Standing balance comment: though she had some feelings of uncertainty and occsaional dizziness                    Cognition Arousal/Alertness: Awake/alert Behavior During Therapy: Anxious Overall Cognitive Status: Within Functional Limits for tasks assessed                      Exercises      General Comments        Pertinent Vitals/Pain Pain Assessment: 0-10 Pain Score: 7     Home Living                      Prior Function  PT Goals (current goals can now be found in the care plan section) Progress towards PT goals: Progressing toward goals    Frequency    Min 2X/week      PT Plan Current plan remains appropriate    Co-evaluation             End of Session Equipment Utilized During Treatment: Gait belt (on room air, no AD) Activity Tolerance: Patient limited by pain Patient left: with bed alarm set;with call bell/phone within reach     Time: 1610-9604 PT Time Calculation (min) (ACUTE ONLY): 17 min  Charges:  $Gait Training: 8-22 mins                    G Codes:      Malachi Pro, DPT 11/02/2015, 4:25 PM

## 2015-11-02 NOTE — Plan of Care (Signed)
Problem: Activity: Goal: Risk for activity intolerance will decrease Outcome: Progressing Pt ambulated with PT today.

## 2015-11-02 NOTE — Progress Notes (Addendum)
Patient ID: Barbara Hawkins, female   DOB: 11-13-1975, 40 y.o.   MRN: [id number] Pt says she is doing well. Has been up walking by herself. Tolerating po well AVSS. Abd- soft, non tender. Colostomy and MF are intact and healthy Left thigh  wound looks clean with pink granulation tissue.

## 2015-11-03 ENCOUNTER — Ambulatory Visit: Payer: Self-pay | Admitting: Surgery

## 2015-11-03 NOTE — Care Management (Signed)
Spoke with Amalia GreenhouseKaren Saunders WOC RN. She states that patient will be provided with 3 supply sets for ostomies prior to discharge.  She will initiate enrollment for indigent program to provide ostomy supplies outpatient.  I have notified Clydie BraunKaren from [facility] that should the the patient discharge home and run out of supplies prior to approval for indigent supplies, [facility] may have to supply supplies in the interim.  Awaiting MD to round this afternoon, for update on discharge disposition.

## 2015-11-03 NOTE — Progress Notes (Signed)
  Visit with patient again this afternoon. Patient reports she has not ambulated today or been seen by physical therapy. Otherwise she states she's had an okay today.   JP drain removed and left thigh wound dressing changed. JP drain replaced with gauze dressing without difficulty. Thigh wound with healthy-appearing granulation tissue. No evidence of infection or wound compromise at this time.  Reiterated with the patient that she needs to ambulate frequently. Discussed with the nursing staff that they would ambulate in the halls with the nursing staff. Encourage ambulation, incentive spirometer usage, oral intake. Discussed that plan would be for her to be discharged home Friday morning. She seems receptive to this goal.  Ricarda Frameharles Skarlet Lyons, MD [facility] General Surgeon [facility]

## 2015-11-03 NOTE — Progress Notes (Signed)
12 Days Post-Op   Subjective:  Patient reports she is feeling somewhat better. Continues to have abdominal pain but states it is improving.  Vital signs in last 24 hours: Temp:  [97.8 F (36.6 C)-98.4 F (36.9 C)] 97.8 F (36.6 C) (10/11 0437) Pulse Rate:  [80-94] 80 (10/11 0437) Resp:  [20] 20 (10/11 0437) BP: (97-118)/(47-65) 109/61 (10/11 0437) SpO2:  [95 %-97 %] 97 % (10/11 0437) Last BM Date: 11/02/15  Intake/Output from previous day: 10/10 0701 - 10/11 0700 In: 720 [P.O.:720] Out: 5 [Drains:5]  GI: Abdomen is large, soft, purple tender to palpation at her incision sites. Midline incision well approximated with staples with healthy-appearing granulation tissue at the previous Penrose drain sites. No evidence of spreading erythema or purulence. In colostomy present in the right upper quadrant that is patent and productive of stool. Mucous fistula present in the left upper quadrant that is also patent. JP drain in the right lower quadrant with a serous output. No evidence of peritonitis on exam.  Skin: Left thigh abscess site with recently applied dressing. No evidence of spreading erythema or fluctuance.  Lab Results:  CBC No results for input(s): WBC, HGB, HCT, PLT in the last 72 hours. CMP     Component Value Date/Time   NA 137 10/31/2015 0615   NA 140 01/30/2014 1513   K 3.3 (L) 10/31/2015 0615   K 4.2 01/30/2014 1513   CL 103 10/31/2015 0615   CL 107 01/30/2014 1513   CO2 28 10/31/2015 0615   CO2 27 01/30/2014 1513   GLUCOSE 101 (H) 10/31/2015 0615   GLUCOSE 98 01/30/2014 1513   BUN <5 (L) 10/31/2015 0615   BUN 5 (L) 01/30/2014 1513   CREATININE 0.49 10/31/2015 0615   CREATININE 0.77 01/30/2014 1513   CALCIUM 7.8 (L) 10/31/2015 0615   CALCIUM 8.9 01/30/2014 1513   PROT 5.6 (L) 10/25/2015 0524   PROT 7.3 01/30/2014 1513   ALBUMIN 2.2 (L) 10/25/2015 0524   ALBUMIN 3.6 01/30/2014 1513   AST 17 10/25/2015 0524   AST 24 01/30/2014 1513   ALT 19 10/25/2015 0524    ALT 28 01/30/2014 1513   ALKPHOS 72 10/25/2015 0524   ALKPHOS 96 01/30/2014 1513   BILITOT 0.6 10/25/2015 0524   BILITOT 0.5 01/30/2014 1513   GFRNONAA >60 10/31/2015 0615   GFRNONAA >60 01/30/2014 1513   GFRNONAA >60 02/27/2013 1736   GFRAA >60 10/31/2015 0615   GFRAA >60 01/30/2014 1513   GFRAA >60 02/27/2013 1736   PT/INR No results for input(s): LABPROT, INR in the last 72 hours.  Studies/Results: No results found.  Assessment/Plan: 40 year old female 12 days status post export her laparotomy for a perforated transverse colon. Doing well all things considered. Plan to return later today for examination of the left thigh wound of the scheduled dressing change. We'll likely remove JP drain at that time as well. Continue to work with physical therapy. Encourage ambulation, incentive spirometer usage, oral intake.   Ricarda Frameharles Shahira Fiske, MD FACS General Surgeon  11/03/2015

## 2015-11-04 NOTE — Progress Notes (Signed)
Chaplain rounding visited Pt recommended by nurse. Patient appeared to be doing well at the time of this visit. Chaplain introduced chaplain services to patient and asked patient if she needs spiritual support and prayers, of which patient said yes. Chaplain provided prayers and spiritual support for the patient.      11/04/15 1500  Clinical Encounter Type  Visited With Patient  Visit Type Initial  Referral From Nurse  Spiritual Encounters  Spiritual Needs Prayer  Stress Factors  Patient Stress Factors None identified;Other (Comment)  Family Stress Factors None identified

## 2015-11-04 NOTE — Progress Notes (Signed)
Pt is not at all entertaining the idea of changing her colostomy bags. Pt was prompted to today and her response was, "I will have enough practice once I get home."

## 2015-11-04 NOTE — Plan of Care (Signed)
Problem: Activity: Goal: Risk for activity intolerance will decrease Outcome: Progressing Pt only ambulated once today around the nurse's station.

## 2015-11-04 NOTE — Plan of Care (Signed)
Problem: Bowel/Gastric: Goal: Will not experience complications related to bowel motility Outcome: Progressing Pt's ostomy is functioning.

## 2015-11-04 NOTE — Progress Notes (Addendum)
Physical Therapy Treatment Patient Details Name: Barbara Hawkins MRN: [id number] DOB: 06/25/1975 Today's Date: 11/04/2015    History of Present Illness Pt is a 40 y.o. female with a past medical history of diverticulitis who presents the emergency department with lower abdominal pain. According to the patient 10/15/15 she had a surgery for debridement of a wound to her left leg. The wound is currently covered with a wound VAC dressing. Asians states since that time she has had progressive abdominal pain mostly in the lower abdomen and left lower quadrant.  Pt is now s/p laparotomy, transverse colon resection with colostomy and mucus fistula creation.    PT Comments    Pt agreeable to PT. Reports pain in abdomen decreasing (4/10); denies pain in Left lower extremity. Pt ambulates to/from bathroom without assistive device with subjective decreased stability/balance primarily due to weakness. Pt independent with toiletting. Pt also ambulates about the room tending to bandage, donning gown in stand, hand hygiene and tending to tray table without loss of balance. Pt requires rest before ambulating outside of room. Offered cane for ambulation due to subjective unsteadiness. Ambulates over 250 feet with cane; without significant change in feeling of steadiness. Pt shows not loss of balance. Post rest pt performs seated and stand exercises with focus on core stabilization during as well as attention to closed and open chain portions of exercise. Pt notes they are much more challenging then expected particularly when done correctly. Provided with written home program and educated on conservative progression. Requires stand rest with upper body support between exercises. Pt wishes return to bed versus chair post session due to fatigue. Continue PT to progress strength and endurance to improve functional mobility and independence.   Follow Up Recommendations  Home health PT;Other (comment) (Pt does not feel she  will want any furhter PT)     Equipment Recommendations  Rolling walker with 5" wheels    Recommendations for Other Services       Precautions / Restrictions Precautions Precautions: Fall Restrictions Weight Bearing Restrictions: No    Mobility  Bed Mobility Overal bed mobility: Modified Independent Bed Mobility: Supine to Sit;Sit to Supine     Supine to sit: Modified independent (Device/Increase time) Sit to supine: Modified independent (Device/Increase time)   General bed mobility comments: Increased time and use of rail; no safety concerns  Transfers Overall transfer level: Modified independent Equipment used: None Transfers: Sit to/from Stand Sit to Stand: Modified independent (Device/Increase time)         General transfer comment: Increased time; use rail at bed and commode to steady self once standing  Ambulation/Gait Ambulation/Gait assistance: Supervision Ambulation Distance (Feet): 270 Feet (to/from bathroom without assistive device) Assistive device: Straight cane Gait Pattern/deviations: Step-through pattern   Gait velocity interpretation: Below normal speed for age/gender (2.5 ft/sec) General Gait Details: Pt notes mild unsteadiness without assistive device that is improving from initial attempt withotu rw. Trialed SPC this walk; pt does not feel it offers increased support versus without assistive device. Pt does note she use one once before post knee injury, so may purchase one.    Stairs            Wheelchair Mobility    Modified Rankin (Stroke Patients Only)       Balance Overall balance assessment: Modified Independent  Cognition Arousal/Alertness: Awake/alert Behavior During Therapy: Anxious (mild, but improved) Overall Cognitive Status: Within Functional Limits for tasks assessed                      Exercises General Exercises - Lower Extremity Long Arc Quad:  AROM;Both;10 reps;Seated Hip ABduction/ADduction: Strengthening;Both;10 reps;Standing Straight Leg Raises: Strengthening;Both;10 reps;Standing Hip Flexion/Marching: Strengthening;Both;10 reps;Standing Heel Raises: Strengthening;Both;10 reps;Standing Mini-Sqauts: Strengthening;10 reps Other Exercises Other Exercises: B hip extension and ham curl 10 repetitions each in stand Other Exercises: Pt educated on core stabilization    General Comments        Pertinent Vitals/Pain Pain Assessment: 0-10 Pain Score: 4  Pain Location: Abdomen (denies in LLE) Pain Intervention(s): Monitored during session    Home Living                      Prior Function            PT Goals (current goals can now be found in the care plan section) Progress towards PT goals: Progressing toward goals    Frequency    Min 2X/week      PT Plan Current plan remains appropriate    Co-evaluation             End of Session   Activity Tolerance: Patient tolerated treatment well;Patient limited by fatigue (weakness) Patient left: in bed;with call bell/phone within reach     Time: 1050-1130 PT Time Calculation (min) (ACUTE ONLY): 40 min  Charges:  $Gait Training: 8-22 mins $Therapeutic Exercise: 23-37 mins                    G [place]:      Sierra Spargo E Marda Breidenbach, PTA 11/04/2015, 11:51 AM

## 2015-11-04 NOTE — Progress Notes (Signed)
13 Days Post-Op   Subjective:  Patient reports that she is having abdominal pain but that the pain medications have helped. Has ambulated today but not much. Is eating well and having normal colostomy function.  Vital signs in last 24 hours: Temp:  [97.5 F (36.4 C)-98.4 F (36.9 C)] 98.3 F (36.8 C) (10/12 1403) Pulse Rate:  [86-114] 87 (10/12 1403) Resp:  [20-22] 20 (10/12 1403) BP: (114-144)/(63-81) 144/81 (10/12 1403) SpO2:  [98 %-99 %] 99 % (10/12 1403) Last BM Date: 11/04/15  Intake/Output from previous day: 10/11 0701 - 10/12 0700 In: 961 [P.O.:960] Out: 1410 [Urine:1300; Drains:10; Stool:100]  GI: Large, soft, appropriately ttp along her incision sites. Midline well approximated with staples without evidence of infection or purulent drainage. End colostomy present in the RUQ that is pink, patent and productive of stool. Mucous fistulat in the LUQ that is also pink and patent producing mucous. Prior drain sites with some serous output but no evidence of infection  Lab Results:  CBC No results for input(s): WBC, HGB, HCT, PLT in the last 72 hours. CMP     Component Value Date/Time   NA 137 10/31/2015 0615   NA 140 01/30/2014 1513   K 3.3 (L) 10/31/2015 0615   K 4.2 01/30/2014 1513   CL 103 10/31/2015 0615   CL 107 01/30/2014 1513   CO2 28 10/31/2015 0615   CO2 27 01/30/2014 1513   GLUCOSE 101 (H) 10/31/2015 0615   GLUCOSE 98 01/30/2014 1513   BUN <5 (L) 10/31/2015 0615   BUN 5 (L) 01/30/2014 1513   CREATININE 0.49 10/31/2015 0615   CREATININE 0.77 01/30/2014 1513   CALCIUM 7.8 (L) 10/31/2015 0615   CALCIUM 8.9 01/30/2014 1513   PROT 5.6 (L) 10/25/2015 0524   PROT 7.3 01/30/2014 1513   ALBUMIN 2.2 (L) 10/25/2015 0524   ALBUMIN 3.6 01/30/2014 1513   AST 17 10/25/2015 0524   AST 24 01/30/2014 1513   ALT 19 10/25/2015 0524   ALT 28 01/30/2014 1513   ALKPHOS 72 10/25/2015 0524   ALKPHOS 96 01/30/2014 1513   BILITOT 0.6 10/25/2015 0524   BILITOT 0.5 01/30/2014  1513   GFRNONAA >60 10/31/2015 0615   GFRNONAA >60 01/30/2014 1513   GFRNONAA >60 02/27/2013 1736   GFRAA >60 10/31/2015 0615   GFRAA >60 01/30/2014 1513   GFRAA >60 02/27/2013 1736   PT/INR No results for input(s): LABPROT, INR in the last 72 hours.  Studies/Results: No results found.  Assessment/Plan: 40 year old female now 13 days status post resection of perforated transverse colon. Gradually improving. All of her drains are now out. Her prior wound VAC spot for left thigh being treated with wet to dry with success. Had a long conversation with the patient about preparing to go home. She states that she will initiate attempting to do her own wound care with nursing assistance rather than vice versa. I did encourage her to do ambulation and incentive from her usage. She will likely be ready to go home soon if her pain can be well controlled and she can perform some of her own daily care.   Ricarda Frameharles Gladys Deckard, MD FACS General Surgeon  11/04/2015

## 2015-11-05 MED ORDER — CYCLOBENZAPRINE HCL 10 MG PO TABS: 10.0000 mg | ORAL_TABLET | Freq: Three times a day (TID) | ORAL | 0 refills | Status: DC

## 2015-11-05 MED ORDER — OXYCODONE HCL 5 MG PO TABS: 5.0000 mg | ORAL_TABLET | ORAL | 0 refills | Status: DC | PRN

## 2015-11-05 MED ORDER — LORAZEPAM 0.5 MG PO TABS: 0.5000 mg | ORAL_TABLET | Freq: Four times a day (QID) | ORAL | 0 refills | Status: DC | PRN

## 2015-11-05 MED ORDER — PANTOPRAZOLE SODIUM 40 MG PO TBEC: 40.0000 mg | DELAYED_RELEASE_TABLET | Freq: Every day | ORAL | 0 refills | Status: DC

## 2015-11-05 NOTE — Consult Note (Signed)
WOC Nurse ostomy consult note Stoma type/location: LUQ Mucus fistula   RMQ Colostomy Stomal assessment/size: Oval shaped 1 3/4" each Peristomal assessment: COLOSTOMY has denuded skin from 3 to 9 o'clock.  Stool is thick and pasty and doesn't fall well into pouch.  Barrier ring was not used at last pouch change and opening was cut larger than needed. WIll treat this skin with stoma powder and barrier ring.   Treatment options for stomal/peristomal skin: Barrier ring to Colostomy and MF, both are flush stomas.  Output Thick brown stool in to Colostomy pouch Thick purulent mucus from MF Ostomy pouching: 2pc. 2 1/4" pouches. Patient has supplies senth ome with her and she is enrolled in secure start program for samples and outpatient support.   Education provided: Patient is proficient in emptying pouch.  Both pouches removed today.  Patient able to mold and apply barrier rings.  Pattern is cut and patient cut out barrier openings.  Applied pouch to barrier with minimal verbal cueing.  Will need ongoing support and body habitus makes self care more challenging, but she understands how to empty and apply pouches.  Enrolled patient in [employer] Secure Start DC program: Yes WOC Nurse wound consult note Reason for Consult: Apply NPWT (MEdela) for discharge and [facility].  Wound type:Surgical wound LEft posterior thigh Pressure Ulcer POA: N/A Measurement: 4 cm x 5 cm x 5 cm  Wound ZOX:WRUEAbed:Beefy red, nongranulating, adipose present to wound bed.  Drainage (amount, consistency, odor) Moderate serosanguinous  No odor.  Periwound:Intact Dressing procedure/placement/frequency:MEdela NPWT system applied with 1 piece black foam.  Change MOn/Wed/Fri.  Will not follow at this time.  Please re-consult if needed.  Maple HudsonKaren Romel Dumond RN BSN CWON Pager [phone]

## 2015-11-05 NOTE — Care Management (Addendum)
Patient to discharge home today.  Home health orders have been placed for RN.  Wound vac was placed at bedside.  RN start of care for 10/14 for ostomy teaching.  Wound Vac changes MWF.  Patient was provided a coupon from [employer] for all prescriptions.  PT has assessed patient and recommended home health PT. Patient does not qualify for home health with her insurance.  Patient was able to ambulated 270 feet with PT.  [facility] should she wish to attend outpatient PT.  RNCM signing off

## 2015-11-05 NOTE — Final Progress Note (Signed)
14 Days Post-Op   Subjective:  Patient reports feeling somewhat better today than yesterday. Continues to have her complaints of pain and discomfort. She is able to participate in her ostomy and wound care today. She is tolerating a diet and has been ambulating the halls with minimal assistance. Voiced that she thinks she is ready to go home. Wound VAC was reapplied to her left posterior thigh wound.  Vital signs in last 24 hours: Temp:  [98.2 F (36.8 C)-98.3 F (36.8 C)] 98.2 F (36.8 C) (10/13 0437) Pulse Rate:  [82-88] 82 (10/13 0437) Resp:  [20] 20 (10/13 0437) BP: (109-144)/(59-81) 127/61 (10/13 0437) SpO2:  [97 %-99 %] 97 % (10/13 0437) Last BM Date: 11/04/15  Intake/Output from previous day: 10/12 0701 - 10/13 0700 In: 717 [P.O.:717] Out: 1900 [Urine:1900]  GI: Abdomen is large, soft, appropriately tender to palpation at the midline incision, nondistended. End colostomy present in the right upper quadrant that his pain, patent, productive of stool. Midline incision with staples still in place, healthy granulation tissue to the most cephalad and caudad aspects from previous Penrose drains. No evidence of purulence or spreading erythema. Mucous fistula present in the left upper quadrant with evidence of mucous at the mouth of the fistula. Previous JP sites without evidence of erythema or drainage.  Lab Results:  CBC No results for input(s): WBC, HGB, HCT, PLT in the last 72 hours. CMP     Component Value Date/Time   NA 137 10/31/2015 0615   NA 140 01/30/2014 1513   K 3.3 (L) 10/31/2015 0615   K 4.2 01/30/2014 1513   CL 103 10/31/2015 0615   CL 107 01/30/2014 1513   CO2 28 10/31/2015 0615   CO2 27 01/30/2014 1513   GLUCOSE 101 (H) 10/31/2015 0615   GLUCOSE 98 01/30/2014 1513   BUN <5 (L) 10/31/2015 0615   BUN 5 (L) 01/30/2014 1513   CREATININE 0.49 10/31/2015 0615   CREATININE 0.77 01/30/2014 1513   CALCIUM 7.8 (L) 10/31/2015 0615   CALCIUM 8.9 01/30/2014 1513   PROT  5.6 (L) 10/25/2015 0524   PROT 7.3 01/30/2014 1513   ALBUMIN 2.2 (L) 10/25/2015 0524   ALBUMIN 3.6 01/30/2014 1513   AST 17 10/25/2015 0524   AST 24 01/30/2014 1513   ALT 19 10/25/2015 0524   ALT 28 01/30/2014 1513   ALKPHOS 72 10/25/2015 0524   ALKPHOS 96 01/30/2014 1513   BILITOT 0.6 10/25/2015 0524   BILITOT 0.5 01/30/2014 1513   GFRNONAA >60 10/31/2015 0615   GFRNONAA >60 01/30/2014 1513   GFRNONAA >60 02/27/2013 1736   GFRAA >60 10/31/2015 0615   GFRAA >60 01/30/2014 1513   GFRAA >60 02/27/2013 1736   PT/INR No results for input(s): LABPROT, INR in the last 72 hours.  Studies/Results: No results found.  Assessment/Plan: 40 year old female status post export her laparotomy for perforated transverse colon. Much improved. Plan for discharge home today. Home health set up for her wound VAC to her left posterior thigh, ostomy care, midline wound care. Encourage ambulation and incentive spirometry usage. Patient will follow-up in clinic next week for additional wound check.   Ricarda Frameharles Elmer Merwin, MD FACS General Surgeon  11/05/2015

## 2015-11-05 NOTE — Progress Notes (Signed)
Although patient seems having trouble with adjustment to the situation, patient somewhat interacted with me when I was doing ostomy care. She did actually touch the bag and the wafer. It was minimal interaction but better that in the past. She is ready to have more education and teaching from ostomy nurse as well. Wound nurse re-consulted today.

## 2015-11-05 NOTE — Discharge Instructions (Signed)
Colostomy Surgery, Adult, Care After Refer to this sheet in the next few weeks. These instructions provide you with information on caring for yourself after your procedure. Your caregiver may also give you more specific instructions. Your treatment has been planned according to current medical practices, but problems sometimes occur. Call your caregiver if you have any problems or questions after your procedure. HOME CARE INSTRUCTIONS  Rest. Give yourself time to adjust to having the external pouch. Give your surgical cuts (incisions) time to heal.  Avoid strenuous activity, heavy lifting, and abdominal exercises for 3 weeks. After that, you may return to your normal activities.  Take pain medicine and other medicines as directed. Your caregiver may recommend certain medicines for the relief of constipation or diarrhea.  You may shower with or without the pouch.  Wear any clothing that is comfortable.  Follow your caregiver's instructions to protect the skin around the stoma.  Change your pouch every 2 to 4 days, or as directed.  Follow your caregiver's dietary instructions. Your caregiver will help you understand which foods may cause blockage, increase bowel movements, slow bowel movements, cause skin irritation, or cause gas.  You may gradually resume most activities, including sexual activity, as directed. Ask your caregiver about becoming pregnant and about using birth control. Medicines may not be absorbed normally after your procedure.  Always discuss your medicines with your caregiver before taking them.  You may travel. Be sure to pack plenty of colostomy supplies.  If you smoke, quit. Smoking slows the healing process. SEEK MEDICAL CARE IF:  You are having trouble caring for your stoma or using the ostomy supplies (changing the pouch).  You feel sick to your stomach (nauseous).  You throw up (vomit).  You notice bleeding, skin irritation, drainage, bulging, redness of the  wounds, or pain around the anus or stoma.  You notice a change in the size or appearance of the stoma.  You have abdominal pain, bloating, pressure, or cramping.  Your stools do not become firmer.  Your stool frequency is more or less often than expected.  You have frequent diarrhea or watery stool.  You are not making enough urine. This may be a sign of dehydration.  You experience sexual dysfunction.  You experience shortness of breath, fatigue, thirst, dry mouth, or unusual sensations in the limbs.  You have other new symptoms.  You have questions or concerns. SEEK IMMEDIATE MEDICAL CARE IF:  Your abdominal pain does not go away or it becomes severe.  You have a fever.  You keep vomiting.  Your stool is not draining through the stoma.  You have an irregular heartbeat or chest pain. MAKE SURE YOU:  Understand these instructions.  Will watch your condition.  Will get help right away if you are not doing well or get worse.   This information is not intended to replace advice given to you by your health care provider. Make sure you discuss any questions you have with your health care provider.   Document Released: 06/01/2010 Document Revised: 01/30/2014 Document Reviewed: 06/01/2010 Elsevier Interactive Patient Education [employer]2016 [employer]vier Inc.

## 2015-11-05 NOTE — Discharge Summary (Signed)
Patient ID: Barbara Hawkins MRN: [id number] DOB/AGE: 05/30/1975 40 y.o.  Admit date: 10/22/2015 Discharge date: 11/05/2015  Discharge Diagnoses:  Perforated transverse colon  Procedures Performed: Transverse colectomy with end colostomy and mucous fistula formation  Discharged Condition: good  Hospital Course: Patient taken to the operating room emergently with a perforated viscus. Was found to have a area of necrotic transverse colon that was excised. An endcolostomy along with mucous fistula was created. Patient had a prolonged course postoperatively for healing secondary to poor nutrition and her large body habitus. At the time of discharge she was able tolerate regular diet, was having adequate ostomy and mucous fistula function, her wounds were progressing and healing well.  Discharge Orders:  discharge home, okay to shower, do not submerge, continue home health for left posterior thigh, midline incision, ostomy care.  Disposition: 01-Home or Self Care  Discharge Medications:   Medication List    STOP taking these medications   ciprofloxacin 500 MG tablet Commonly known as:  CIPRO     TAKE these medications   acetaminophen 500 MG tablet Commonly known as:  TYLENOL Take 1,000 mg by mouth every 6 (six) hours as needed.   aspirin EC 81 MG tablet Take 81 mg by mouth as needed for mild pain or moderate pain.   cyclobenzaprine 10 MG tablet Commonly known as:  FLEXERIL Take 1 tablet (10 mg total) by mouth 3 (three) times daily.   ibuprofen 800 MG tablet Commonly known as:  ADVIL,MOTRIN Take 1 tablet (800 mg total) by mouth every 8 (eight) hours as needed for moderate pain.   LORazepam 0.5 MG tablet Commonly known as:  ATIVAN Take 1 tablet (0.5 mg total) by mouth every 6 (six) hours as needed for anxiety.   multivitamin tablet Take 1 tablet by mouth daily.   omeprazole 20 MG capsule Commonly known as:  PRILOSEC Take 20 mg by mouth daily.   oxyCODONE 5 MG immediate  release tablet Commonly known as:  Oxy IR/ROXICODONE Take 1-2 tablets (5-10 mg total) by mouth every 3 (three) hours as needed for severe pain or breakthrough pain.   pantoprazole 40 MG tablet Commonly known as:  PROTONIX Take 1 tablet (40 mg total) by mouth daily.        Follwup: Follow-up Information    Ricarda Frameharles Mishika Flippen, MD. Go in 6 day(s).   Specialty:  General Surgery Why:  wound check Contact information: [address] [place] [id number] [phone]           Signed: Ricarda FrameCharles Dewan Emond 11/05/2015, 1:24 PM

## 2015-11-05 NOTE — Progress Notes (Signed)
Alert and oriented.  No signs of acute distress. Discharge instructions given. Patient verbalized understanding. No other issues noted at this time. Education given to patient regarding ostomy care and dressing changed to abd wound and wound vac to left leg. Patient verbalized understanding.Questions answered by MD. No other issues at this time.

## 2015-11-10 ENCOUNTER — Encounter: Payer: Self-pay | Admitting: General Surgery

## 2015-11-10 ENCOUNTER — Ambulatory Visit (INDEPENDENT_AMBULATORY_CARE_PROVIDER_SITE_OTHER): Payer: Self-pay | Admitting: General Surgery

## 2015-11-10 VITALS — BP 160/101 | HR 101 | Temp 98.1°F | Ht 66.0 in | Wt 296.0 lb

## 2015-11-10 DIAGNOSIS — Z4889 Encounter for other specified surgical aftercare: Secondary | ICD-10-CM

## 2015-11-10 MED ORDER — AMOXICILLIN-POT CLAVULANATE 875-125 MG PO TABS: 1.0000 | ORAL_TABLET | Freq: Two times a day (BID) | ORAL | 0 refills | Status: DC

## 2015-11-10 MED ORDER — OXYCODONE HCL 5 MG PO TABS: 5.0000 mg | ORAL_TABLET | Freq: Four times a day (QID) | ORAL | 0 refills | Status: DC | PRN

## 2015-11-10 NOTE — Progress Notes (Signed)
Outpatient Surgical Follow Up  11/10/2015  Barbara Hawkins is an 40 y.o. female.   Chief Complaint  Patient presents with  . Routine Post Op    Exploratory laparotomy, TransverseColon Resection & Colostomy Mucous Fistula 10/23/15 Dr. Tonita Cong     HPI: 40 year old female returns to clinic now 2-1/2 weeks status post exploratory laparotomy with resection of the transverse colon. She has been home for the last 5 days. She states she's had some nausea over the last couple days but has had good ostomy output. She continues to have pain and requires pain medications. She's been using home health for her ostomy care as well as changing of her left posterior thigh wound VAC. She currently denies any fevers, chills, chest pain, shortness of breath. She's been having good ostomy output as well. Her primary complaints of pain into the right side of the abdomen near her in colostomy.  Past Medical History:  Diagnosis Date  . Collagen vascular disease (HCC)   . Diverticulitis   . Ehlers-Danlos disease     Past Surgical History:  Procedure Laterality Date  . APPLICATION OF WOUND VAC Left 09/26/2015   Procedure: APPLICATION OF WOUND VAC;  Surgeon: Kieth Brightly, MD;  Location: [facility];  Service: General;  Laterality: Left;  . APPLICATION OF WOUND VAC Left 10/15/2015   Procedure: APPLICATION OF WOUND VAC;  Surgeon: Tiney Rouge III, MD;  Location: [facility];  Service: General;  Laterality: Left;  . FOOT SURGERY    . INCISION AND DRAINAGE ABSCESS Left 09/26/2015   Procedure: INCISION AND DRAINAGE ABSCESS;  Surgeon: Kieth Brightly, MD;  Location: [facility];  Service: General;  Laterality: Left;  . IRRIGATION AND DEBRIDEMENT ABSCESS Left 10/15/2015   Procedure: IRRIGATION AND DEBRIDEMENT LEG ABSCESS;  Surgeon: Tiney Rouge III, MD;  Location: [facility];  Service: General;  Laterality: Left;  . LAPAROSCOPY N/A 10/22/2015   Procedure: LAPAROSCOPY DIAGNOSTIC;  Surgeon: Barbara Frame, MD;  Location: [facility]  [facility];  Service: General;  Laterality: N/A;  . LAPAROTOMY  10/22/2015   Procedure: LAPAROTOMY;  Surgeon: Barbara Frame, MD;  Location: [facility];  Service: General;;  . TRANSVERSE COLON RESECTION  10/22/2015   Procedure: TRANSVERSE COLON RESECTION WITH CREATION OF COLOSTOMY AND MUCUS FISTULA CREATION;  Surgeon: Barbara Frame, MD;  Location: [facility];  Service: General;;  . TUBAL LIGATION Bilateral     Family History  Problem Relation Age of Onset  . Stroke Mother   . Kidney disease Mother   . Hypertension Father   . Thyroid disease Father   . Stroke Brother     Social History:  reports that she has never smoked. She has never used smokeless tobacco. She reports that she does not drink alcohol or use drugs.  Allergies:  Allergies  Allergen Reactions  . Morphine Hives  . Morphine And Related Itching and Swelling  . Septra [Sulfamethoxazole-Trimethoprim] Hives and Swelling  . Sulfamethoxazole-Trimethoprim Hives    Medications reviewed.    ROS A multipoint review of systems was completed, all pertinent positives and negatives are documented within the history of present illness and remainder are negative.   BP (!) 160/101 (BP Location: Left Arm, Patient Position: Sitting)   Pulse (!) 101   Temp 98.1 F (36.7 C) (Oral)   Ht 5\' 6"  (1.676 m)   Wt 134.3 kg (296 lb)   LMP 09/27/2015 Comment: tubal ligation,preg neg  BMI 47.78 kg/m   Physical Exam Gen.: No acute distress Chest: Clear to auscultation Heart: Tachycardic  Abdomen: Large, soft, purple tender to palpation along her incision sites. Midline incision well approximated with staples with previous Penrose sites to the most cephalad and caudad aspects with fibrinous exudate. Mild hyperemia but no spreading erythema. End colostomy present in the right upper quadrant that is pink, patent, productive of stool. 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 Previous JP sites with  mild hyperemia but no signs of spreading erythema or.. Extremities: Left upper extremity with a posterior wound VAC. Wound VAC removed revealing healthy-appearing granulation tissue.    No results found for this or any previous visit (from the past 48 hour(s)). No results found.  Assessment/Plan:  1. 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 Doing fairly well. Penrose drain tract treated with silver nitrate on the caudad aspect today. Provided with refill of pain medications and started her on an oral antibiotic due to concerns of possible infection to the Penrose tract. Again encouraged patient to ambulate and perform self care. Patient voiced understanding and noted that all of her wounds appear to be progressing well. Again reiterated to the patient that she will need further workup of her colon prior to having her colostomy reversal. This will likely be performed sometime in 2018. Patient return to clinic in 2 weeks for an additional wound check.     Barbara Frameharles Julieta Rogalski, MD FACS General Surgeon  11/10/2015,3:15 PM

## 2015-11-10 NOTE — Patient Instructions (Signed)
We have sent your antibiotic to your pharmacy. Please begin this today.   We have also given you a prescription for pain medication, please take this to your pharmacy to have filled.  We will see you back in 2 weeks but if you have any questions or concerns at all, please call and speak with my nurse Terre Zabriskie.

## 2015-11-19 ENCOUNTER — Telehealth: Payer: Self-pay

## 2015-11-19 NOTE — Telephone Encounter (Signed)
[facility] called to report on Barbara Hawkins. Tresa EndoKelly stated [name] had loose stool yesterday,however she has not had any output in 24 hours. No other symptoms at this time.  She was instructed to tell patient to try 2 doses of  Miralax 17 grams six hours apart and to increase water to 72 ounces daily. If no results then try 2 dulcolax tablets. Patient has appointment next week with Dr.Woodham.

## 2015-11-23 ENCOUNTER — Encounter: Payer: Self-pay | Admitting: General Surgery

## 2015-11-23 ENCOUNTER — Ambulatory Visit (INDEPENDENT_AMBULATORY_CARE_PROVIDER_SITE_OTHER): Payer: Self-pay | Admitting: General Surgery

## 2015-11-23 VITALS — BP 134/94 | HR 108 | Temp 97.8°F | Ht 66.0 in | Wt 276.0 lb

## 2015-11-23 DIAGNOSIS — Z4889 Encounter for other specified surgical aftercare: Secondary | ICD-10-CM

## 2015-11-23 NOTE — Patient Instructions (Signed)
We would like for you to see the gastroenterologist Dr.Wohl and someone from their office will be calling to schedule an appointment. Please see your follow up appointment listed below with Dr.Loflin.

## 2015-11-23 NOTE — Progress Notes (Signed)
Outpatient Surgical Follow Up  11/23/2015  Barbara Hawkins is an 40 y.o. female.   Chief Complaint  Patient presents with  . Routine Post Op    Exploratory Laparotomy w/ transverse perforation Colostomy and mucous Fistula creation     HPI: 40 year old female returns to clinic 4 weeks status post export her laparotomy with creation of end colostomy and mucous fistula. Patient continues to have abdominal pain but states it is tolerable. Her ostomy is functioning well and she is having occasional output from her mucous fistula. The majority of her midline wound and her drain sites have completely healed. She still has a wound VAC present to her left lower extremity that has been changed by home health. She has been eating well and ambulating. She denies any fevers, chills, nausea, vomiting, chest pain, shortness of breath, diarrhea, constipation.  Past Medical History:  Diagnosis Date  . Collagen vascular disease (HCC)   . Diverticulitis   . Ehlers-Danlos disease     Past Surgical History:  Procedure Laterality Date  . APPLICATION OF WOUND VAC Left 09/26/2015   Procedure: APPLICATION OF WOUND VAC;  Surgeon: Kieth BrightlySeeplaputhur G Sankar, MD;  Location: [facility];  Service: General;  Laterality: Left;  . APPLICATION OF WOUND VAC Left 10/15/2015   Procedure: APPLICATION OF WOUND VAC;  Surgeon: Tiney Rougealph Ely III, MD;  Location: [facility];  Service: General;  Laterality: Left;  . FOOT SURGERY    . INCISION AND DRAINAGE ABSCESS Left 09/26/2015   Procedure: INCISION AND DRAINAGE ABSCESS;  Surgeon: Kieth BrightlySeeplaputhur G Sankar, MD;  Location: [facility];  Service: General;  Laterality: Left;  . IRRIGATION AND DEBRIDEMENT ABSCESS Left 10/15/2015   Procedure: IRRIGATION AND DEBRIDEMENT LEG ABSCESS;  Surgeon: Tiney Rougealph Ely III, MD;  Location: [facility];  Service: General;  Laterality: Left;  . LAPAROSCOPY N/A 10/22/2015   Procedure: LAPAROSCOPY DIAGNOSTIC;  Surgeon: Ricarda Frameharles Gurtej Noyola, MD;  Location: [facility];  Service: General;   Laterality: N/A;  . LAPAROTOMY  10/22/2015   Procedure: LAPAROTOMY;  Surgeon: Ricarda Frameharles Whitney Bingaman, MD;  Location: [facility];  Service: General;;  . TRANSVERSE COLON RESECTION  10/22/2015   Procedure: TRANSVERSE COLON RESECTION WITH CREATION OF COLOSTOMY AND MUCUS FISTULA CREATION;  Surgeon: Ricarda Frameharles Paislie Tessler, MD;  Location: [facility];  Service: General;;  . TUBAL LIGATION Bilateral     Family History  Problem Relation Age of Onset  . Stroke Mother   . Kidney disease Mother   . Hypertension Father   . Thyroid disease Father   . Stroke Brother     Social History:  reports that she has never smoked. She has never used smokeless tobacco. She reports that she does not drink alcohol or use drugs.  Allergies:  Allergies  Allergen Reactions  . Morphine Hives  . Morphine And Related Itching and Swelling  . Septra [Sulfamethoxazole-Trimethoprim] Hives and Swelling  . Sulfamethoxazole-Trimethoprim Hives    Medications reviewed.    ROS A multipoint review of systems was completed. All pertinent positives and negatives are documented within the history of present illness and the remainder are negative.   BP (!) 134/94   Pulse (!) 108   Temp 97.8 F (36.6 C) (Oral)   Ht 5\' 6"  (1.676 m)   Wt 125.2 kg (276 lb)   BMI 44.55 kg/m   Physical Exam Gen.: No acute distress Chest: Clear to auscultation Heart: Tachycardic Abdomen: Very large, soft, minimally tender to palpation at her incision sites. Midline incision is mostly healed with the exception of the most  cephalad 5 mm that shows hyper granulation tissue. Her right upper quadrant ostomy is pink, patent, productive of gas and stool. Her mucous fistula in the left upper quadrant is pink and patent with mucous present at its mouth. The previous drain sites in her bilateral lower quadrants are well-healed. Skin: Left lower sternal wound VAC is in place with good seal without any evidence of spreading erythema with serous fluid within the drainage  tubing.    No results found for this or any previous visit (from the past 48 hour(s)). No results found.  Assessment/Plan:  1. Aftercare following surgery 40 year old female with a complex medical and surgical history. Doing well now 4 weeks status post exploratory laparotomy for perforated transverse colon. Granulation tissue in midline wound was treated with silver nitrate today. Discussed continuing wound care to the mucous fistula and any areas of drainage from the midline wound. No need to continue dressing the healed portions of the wound. She is to continue the wound VAC to her left lower extremity it was not changed today as it was changed yesterday and will be changed again tomorrow. Discussed that there any signs of infection at any site that she has return to clinic medially. Otherwise she'll follow-up in clinic in 2 weeks for additional wound check. A GI consult will be placed today so that she can have an endoscopy performed sometime later this year with the hopes of reversing her ostomies next year.     Ricarda Frameharles Divine Hansley, MD FACS General Surgeon  11/23/2015,10:18 AM

## 2015-11-29 ENCOUNTER — Ambulatory Visit: Payer: Self-pay | Admitting: Gastroenterology

## 2015-12-10 ENCOUNTER — Ambulatory Visit (INDEPENDENT_AMBULATORY_CARE_PROVIDER_SITE_OTHER): Payer: Self-pay | Admitting: Surgery

## 2015-12-10 ENCOUNTER — Encounter: Payer: Self-pay | Admitting: Surgery

## 2015-12-10 VITALS — BP 137/95 | HR 96 | Temp 98.0°F | Ht 66.0 in | Wt 264.0 lb

## 2015-12-10 DIAGNOSIS — R198 Other specified symptoms and signs involving the digestive system and abdomen: Secondary | ICD-10-CM

## 2015-12-10 DIAGNOSIS — S71102D Unspecified open wound, left thigh, subsequent encounter: Secondary | ICD-10-CM

## 2015-12-10 NOTE — Progress Notes (Signed)
40 year old female well-known to the surgery service for having a perforated viscus recently and also still having left lower extremity wound from an abscess. The patient has been doing okay. The patient has been getting her appetite and her energy back to live this is been slow to return. The patient is having good output out of the ostomy site but does have some drainage and leakage around it due to some retraction and it being in a fold. The patient has had stoma paste ordered. Patient states that she's also been having some tenderness in the left leg when the wound VAC is placed on.  Patient has had no fever or chills and otherwise is been doing well  Vitals:   12/10/15 1136  BP: (!) 137/95  Pulse: 96  Temp: 98 F (36.7 C)   PE:  Gen: NAD Abd: Soft scar well healed, right sided end ostomy pink and patent but with some maceration around the tissues, left-sided mucous fistula with healthy tissue surrounding pink and patent  LLE: Good granulation tissue with healthy bleeding almost completely flush with the skin   A/P:  The patient is continuing to improve after a large surgery. The patient has colonoscopy and sigmoidoscopy scheduled with Dr. Servando SnareWohl for next month and a follow-up with Dr. Tonita CongWoodham at the beginning of the yearto2 potentially schedule for reversal.  I have discontinued the way back from her left lower extremity and done wet-to-dry dressings in this area. Patient is to call with any questions or concerns but otherwise well with Dr. Hoy FinlayWoodman at the beginning of the

## 2015-12-10 NOTE — Patient Instructions (Addendum)
We will have you follow-up with Dr. Servando SnareWohl in regards to your Upper Endoscopy and Lower scope to look at small and large bowel.  Then we will have you come back in to see Dr. Tonita CongWoodham to discuss Ostomy reversal after the first of the year.  We have contacted Maple HudsonKaren Sanders (our ostomy nurse) to speak with you about better supplies for ostomy seal. We will call you with this appointment information.  Please see appointments below.

## 2015-12-13 ENCOUNTER — Telehealth: Payer: Self-pay | Admitting: Surgery

## 2015-12-13 NOTE — Telephone Encounter (Signed)
Barbara [facility]Sheila with [facility] called and needs verbal orders to discontinue wound vac and decrease her nursing frequency. Please advise.

## 2015-12-14 NOTE — Telephone Encounter (Signed)
Patient seen in office last Friday. Wound Vac was discontinued. She is to place dry guaze to the area on the left posterior thigh wound. She has asked that her nursing frequency be decreased as well. Called [facility] at this time. Spoke with Melissa.   Verbal orders given to d/c wound vac and decrease nursing frequency per Dr. Orvis BrillLoflin.

## 2015-12-30 ENCOUNTER — Ambulatory Visit: Payer: Self-pay | Admitting: Gastroenterology

## 2016-01-04 ENCOUNTER — Telehealth: Payer: Self-pay | Admitting: General Surgery

## 2016-01-04 NOTE — Telephone Encounter (Signed)
Barbara Hawkins from [facility] called in regards to Barbara Hawkins.  "She's out of the service area and [facility] is going to discharge her because she's independent with her wound and ostomy care as of 12:11pm on 12.11.17." Patient is agreeable to this. If you have questions or concerns, please contact Barbara Hawkins and/or Barbara [facility] at [facility].  [name]'s ph# [phone] Thank you.

## 2016-01-04 NOTE — Telephone Encounter (Signed)
Called and spoke with Velna HatchetSheila from [facility]. Velna HatchetSheila stated that yesterday she went to see the patient to assist her on her wound and ostomy and patient told her that she was doing fine by doing things on her own. Velna HatchetSheila stated that patient's wound was healing and that her ostomy was good. Patient seemed to be doing well on her own. Velna HatchetSheila told patient to give us a call in case she had any questions or noticed and changes. Patient agreed.

## 2016-01-12 ENCOUNTER — Telehealth: Payer: Self-pay | Admitting: General Surgery

## 2016-01-12 NOTE — Telephone Encounter (Signed)
Patient called and stated that her fistula is closed with a little pain. No fever.

## 2016-01-12 NOTE — Telephone Encounter (Signed)
Spoke with Dr. Orvis BrillLoflin at this time. She explained that patient may have increased redness to the area of mucous fistula and this area may even open and begin to drain again but should not effect reversal process.   This was relayed to patient and she is asked to call with any increased redness, fever, nausea/vomiting, or abdominal pain.

## 2016-01-12 NOTE — Telephone Encounter (Signed)
Spoke with patient at this time. She said the fistula has closed and she is having mucus from rectum. She is having a litle pain. She denies fever, however she states that she has not had fever with all the other problems.she is wondering should she be seen today incase there is something going on.  I contacted [place] office to see if patient needed to come in to be seen. Spoke with Angie.

## 2016-01-27 ENCOUNTER — Ambulatory Visit (INDEPENDENT_AMBULATORY_CARE_PROVIDER_SITE_OTHER): Payer: BLUE CROSS/BLUE SHIELD | Admitting: Gastroenterology

## 2016-01-27 ENCOUNTER — Encounter: Payer: Self-pay | Admitting: Gastroenterology

## 2016-01-27 ENCOUNTER — Other Ambulatory Visit: Payer: Self-pay

## 2016-01-27 VITALS — HR 79 | Temp 98.0°F | Resp 18 | Ht 66.0 in | Wt 270.0 lb

## 2016-01-27 DIAGNOSIS — K219 Gastro-esophageal reflux disease without esophagitis: Secondary | ICD-10-CM | POA: Diagnosis not present

## 2016-01-27 DIAGNOSIS — K559 Vascular disorder of intestine, unspecified: Secondary | ICD-10-CM
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
## 2016-01-28 ENCOUNTER — Encounter: Payer: Self-pay | Admitting: *Deleted

## 2016-01-28 NOTE — Discharge Instructions (Signed)

## 2016-01-31 ENCOUNTER — Ambulatory Visit
Admission: RE | Admit: 2016-01-31 | Discharge: 2016-01-31 | Disposition: A | Discharge status: Home / Self Care | Payer: BLUE CROSS/BLUE SHIELD | Source: Ambulatory Visit | Attending: Gastroenterology | Admitting: Gastroenterology

## 2016-01-31 ENCOUNTER — Encounter
Admission: RE | Disposition: A | Discharge status: Home / Self Care | Payer: Self-pay | Source: Ambulatory Visit | Attending: Gastroenterology

## 2016-01-31 ENCOUNTER — Ambulatory Visit: Payer: BLUE CROSS/BLUE SHIELD | Admitting: Anesthesiology

## 2016-01-31 DIAGNOSIS — K55039 Acute (reversible) ischemia of large intestine, extent unspecified: Secondary | ICD-10-CM | POA: Diagnosis not present

## 2016-01-31 DIAGNOSIS — K640 First degree hemorrhoids: Secondary | ICD-10-CM | POA: Insufficient documentation

## 2016-01-31 DIAGNOSIS — K529 Noninfective gastroenteritis and colitis, unspecified: Secondary | ICD-10-CM | POA: Insufficient documentation

## 2016-01-31 DIAGNOSIS — Z885 Allergy status to narcotic agent status: Secondary | ICD-10-CM | POA: Insufficient documentation

## 2016-01-31 DIAGNOSIS — Z933 Colostomy status: Secondary | ICD-10-CM | POA: Diagnosis not present

## 2016-01-31 DIAGNOSIS — Z823 Family history of stroke: Secondary | ICD-10-CM | POA: Insufficient documentation

## 2016-01-31 DIAGNOSIS — Z841 Family history of disorders of kidney and ureter: Secondary | ICD-10-CM | POA: Insufficient documentation

## 2016-01-31 DIAGNOSIS — Z01818 Encounter for other preprocedural examination: Secondary | ICD-10-CM

## 2016-01-31 DIAGNOSIS — M359 Systemic involvement of connective tissue, unspecified: Secondary | ICD-10-CM | POA: Diagnosis not present

## 2016-01-31 DIAGNOSIS — Z882 Allergy status to sulfonamides status: Secondary | ICD-10-CM | POA: Insufficient documentation

## 2016-01-31 DIAGNOSIS — Z9049 Acquired absence of other specified parts of digestive tract: Secondary | ICD-10-CM | POA: Diagnosis not present

## 2016-01-31 DIAGNOSIS — Z883 Allergy status to other anti-infective agents status: Secondary | ICD-10-CM | POA: Insufficient documentation

## 2016-01-31 DIAGNOSIS — K5730 Diverticulosis of large intestine without perforation or abscess without bleeding: Secondary | ICD-10-CM | POA: Diagnosis not present

## 2016-01-31 DIAGNOSIS — R12 Heartburn: Secondary | ICD-10-CM | POA: Diagnosis not present

## 2016-01-31 DIAGNOSIS — Z79899 Other long term (current) drug therapy: Secondary | ICD-10-CM | POA: Insufficient documentation

## 2016-01-31 DIAGNOSIS — Z9889 Other specified postprocedural states: Secondary | ICD-10-CM | POA: Insufficient documentation

## 2016-01-31 DIAGNOSIS — Z8249 Family history of ischemic heart disease and other diseases of the circulatory system: Secondary | ICD-10-CM | POA: Diagnosis not present

## 2016-01-31 DIAGNOSIS — Q796 Ehlers-Danlos syndrome: Secondary | ICD-10-CM | POA: Diagnosis not present

## 2016-01-31 DIAGNOSIS — M19079 Primary osteoarthritis, unspecified ankle and foot: Secondary | ICD-10-CM | POA: Diagnosis not present

## 2016-01-31 DIAGNOSIS — Z8349 Family history of other endocrine, nutritional and metabolic diseases: Secondary | ICD-10-CM | POA: Insufficient documentation

## 2016-01-31 DIAGNOSIS — I7300 Raynaud's syndrome without gangrene: Secondary | ICD-10-CM | POA: Insufficient documentation

## 2016-01-31 DIAGNOSIS — K219 Gastro-esophageal reflux disease without esophagitis: Secondary | ICD-10-CM | POA: Insufficient documentation

## 2016-01-31 DIAGNOSIS — Z8719 Personal history of other diseases of the digestive system: Secondary | ICD-10-CM | POA: Diagnosis present

## 2016-01-31 HISTORY — DX: Unspecified osteoarthritis, unspecified site: M19.90

## 2016-01-31 HISTORY — PX: COLONOSCOPY WITH PROPOFOL: SHX5780

## 2016-01-31 HISTORY — DX: Cardiac arrhythmia, unspecified: I49.9

## 2016-01-31 HISTORY — DX: Gastro-esophageal reflux disease without esophagitis: K21.9

## 2016-01-31 HISTORY — PX: ESOPHAGOGASTRODUODENOSCOPY (EGD) WITH PROPOFOL: SHX5813

## 2016-01-31 HISTORY — DX: Raynaud's syndrome without gangrene: I73.00

## 2016-01-31 SURGERY — COLONOSCOPY WITH PROPOFOL
Anesthesia: Monitor Anesthesia Care | Wound class: Contaminated

## 2016-01-31 MED ORDER — LIDOCAINE HCL (CARDIAC) 20 MG/ML IV SOLN
INTRAVENOUS | Status: DC | PRN
  Administered 2016-01-31: 20 mg via INTRAVENOUS

## 2016-01-31 MED ORDER — LACTATED RINGERS IV SOLN
INTRAVENOUS | Status: DC
  Administered 2016-01-31: 11:00:00 via INTRAVENOUS

## 2016-01-31 MED ORDER — PROPOFOL 10 MG/ML IV BOLUS
INTRAVENOUS | Status: DC | PRN
  Administered 2016-01-31 (×3): 40 mg via INTRAVENOUS
  Administered 2016-01-31 (×2): 100 mg via INTRAVENOUS
  Administered 2016-01-31 (×3): 40 mg via INTRAVENOUS

## 2016-01-31 MED ORDER — STERILE WATER FOR IRRIGATION IR SOLN
Status: DC | PRN
  Administered 2016-01-31: 11:00:00

## 2016-01-31 MED ORDER — GLYCOPYRROLATE 0.2 MG/ML IJ SOLN
INTRAMUSCULAR | Status: DC | PRN
  Administered 2016-01-31: 0.2 mg via INTRAVENOUS

## 2016-01-31 SURGICAL SUPPLY — 35 items
BALLN DILATOR 10-12 8 (BALLOONS)
BALLN DILATOR 12-15 8 (BALLOONS)
BALLN DILATOR 15-18 8 (BALLOONS)
BALLN DILATOR CRE 0-12 8 (BALLOONS)
BALLN DILATOR ESOPH 8 10 CRE (MISCELLANEOUS) IMPLANT
BALLOON DILATOR 12-15 8 (BALLOONS) IMPLANT
BALLOON DILATOR 15-18 8 (BALLOONS) IMPLANT
BALLOON DILATOR CRE 0-12 8 (BALLOONS) IMPLANT
BLOCK BITE 60FR ADLT L/F GRN (MISCELLANEOUS) ×2 IMPLANT
CANISTER SUCT 1200ML W/VALVE (MISCELLANEOUS) ×2 IMPLANT
CLIP HMST 235XBRD CATH ROT (MISCELLANEOUS) IMPLANT
CLIP RESOLUTION 360 11X235 (MISCELLANEOUS)
FCP ESCP3.2XJMB 240X2.8X (MISCELLANEOUS)
FORCEPS BIOP RAD 4 LRG CAP 4 (CUTTING FORCEPS) IMPLANT
FORCEPS BIOP RJ4 240 W/NDL (MISCELLANEOUS)
FORCEPS ESCP3.2XJMB 240X2.8X (MISCELLANEOUS) IMPLANT
GOWN CVR UNV OPN BCK APRN NK (MISCELLANEOUS) ×2 IMPLANT
GOWN ISOL THUMB LOOP REG UNIV (MISCELLANEOUS) ×4
INJECTOR VARIJECT VIN23 (MISCELLANEOUS) IMPLANT
KIT DEFENDO VALVE AND CONN (KITS) IMPLANT
KIT ENDO PROCEDURE OLY (KITS) ×2 IMPLANT
MARKER SPOT ENDO TATTOO 5ML (MISCELLANEOUS) IMPLANT
PAD GROUND ADULT SPLIT (MISCELLANEOUS) IMPLANT
PROBE APC STR FIRE (PROBE) IMPLANT
RETRIEVER NET PLAT FOOD (MISCELLANEOUS) IMPLANT
RETRIEVER NET ROTH 2.5X230 LF (MISCELLANEOUS) IMPLANT
SNARE SHORT THROW 13M SML OVAL (MISCELLANEOUS) IMPLANT
SNARE SHORT THROW 30M LRG OVAL (MISCELLANEOUS) IMPLANT
SNARE SNG USE RND 15MM (INSTRUMENTS) IMPLANT
SPOT EX ENDOSCOPIC TATTOO (MISCELLANEOUS)
SYR INFLATION 60ML (SYRINGE) IMPLANT
TRAP ETRAP POLY (MISCELLANEOUS) IMPLANT
VARIJECT INJECTOR VIN23 (MISCELLANEOUS)
WATER STERILE IRR 250ML POUR (IV SOLUTION) ×2 IMPLANT
WIRE CRE 18-20MM 8CM F G (MISCELLANEOUS) IMPLANT

## 2016-01-31 NOTE — Transfer of Care (Signed)
Immediate Anesthesia Transfer of Care Note  Patient: Barbara Hawkins  Procedure(s) Performed: Procedure(s): COLONOSCOPY WITH PROPOFOL (N/A) ESOPHAGOGASTRODUODENOSCOPY (EGD) WITH PROPOFOL (N/A)  Patient Location: PACU  Anesthesia Type: MAC  Level of Consciousness: awake, alert  and patient cooperative  Airway and Oxygen Therapy: Patient Spontanous Breathing and Patient connected to supplemental oxygen  Post-op Assessment: Post-op Vital signs reviewed, Patient's Cardiovascular Status Stable, Respiratory Function Stable, Patent Airway and No signs of Nausea or vomiting  Post-op Vital Signs: Reviewed and stable  Complications: No apparent anesthesia complications

## 2016-01-31 NOTE — Anesthesia Postprocedure Evaluation (Signed)
Anesthesia Post Note  Patient: Barbara [facility]  Procedure(s) Performed: Procedure(s) (LRB): COLONOSCOPY WITH PROPOFOL (N/A) ESOPHAGOGASTRODUODENOSCOPY (EGD) WITH PROPOFOL (N/A)  Patient location during evaluation: PACU Anesthesia Type: MAC Level of consciousness: awake and alert Pain management: pain level controlled Vital Signs Assessment: post-procedure vital signs reviewed and stable Respiratory status: spontaneous breathing, nonlabored ventilation, respiratory function stable and patient connected to nasal cannula oxygen Cardiovascular status: stable and blood pressure returned to baseline Anesthetic complications: no    Alisa Graff

## 2016-01-31 NOTE — Anesthesia Preprocedure Evaluation (Signed)
Anesthesia Evaluation  Patient identified by MRN, date of birth, ID band Patient awake    Reviewed: Allergy & Precautions, H&P , NPO status , Patient's Chart, lab work & pertinent test results, reviewed documented beta blocker date and time   Airway Mallampati: II  TM Distance: >3 FB Neck ROM: full    Dental no notable dental hx.    Pulmonary neg pulmonary ROS,    Pulmonary exam normal breath sounds clear to auscultation       Cardiovascular Exercise Tolerance: Good + dysrhythmias (benign PACs/PVCs)  Rhythm:regular Rate:Normal     Neuro/Psych negative neurological ROS  negative psych ROS   GI/Hepatic Neg liver ROS, GERD  ,  Endo/Other  negative endocrine ROS  Renal/GU negative Renal ROS  negative genitourinary   Musculoskeletal   Abdominal   Peds  Hematology negative hematology ROS (+)   Anesthesia Other Findings Ehlers-Danlos disease, Raynaud disease  Reproductive/Obstetrics negative OB ROS                             Anesthesia Physical Anesthesia Plan  ASA: II  Anesthesia Plan: MAC   Post-op Pain Management:    Induction:   Airway Management Planned:   Additional Equipment:   Intra-op Plan:   Post-operative Plan:   Informed Consent: I have reviewed the patients History and Physical, chart, labs and discussed the procedure including the risks, benefits and alternatives for the proposed anesthesia with the patient or authorized representative who has indicated his/her understanding and acceptance.   Dental Advisory Given  Plan Discussed with: CRNA  Anesthesia Plan Comments:         Anesthesia Quick Evaluation

## 2016-01-31 NOTE — Anesthesia Procedure Notes (Signed)
Procedure Name: MAC Performed by: Devontae Casasola Pre-anesthesia Checklist: Patient identified, Emergency Drugs available, Suction available, Patient being monitored and Timeout performed Patient Re-evaluated:Patient Re-evaluated prior to inductionOxygen Delivery Method: Nasal cannula Preoxygenation: Pre-oxygenation with 100% oxygen       

## 2016-01-31 NOTE — Op Note (Signed)
[facility] Gastroenterology Patient Name: Barbara Hawkins Procedure Date: 01/31/2016 10:44 AM MRN: [id number] Account #: [id number] Date of Birth: Jan 12, 1976 Admit Type: Outpatient Age: 41 [facility] Gender: Female Note Status: Finalized Procedure:            Colonoscopy Indications:          Preoperative assessment, Follow-up of acute ischemic                        colitis Providers:            Midge Miniumarren Marin Milley MD, MD Referring MD:         Ricarda Frameharles Woodham, MD (Referring MD) Medicines:            Propofol per Anesthesia Complications:        No immediate complications. Procedure:            Pre-Anesthesia Assessment:                       - Prior to the procedure, a History and Physical was                        performed, and patient medications and allergies were                        reviewed. The patient's tolerance of previous                        anesthesia was also reviewed. The risks and benefits of                        the procedure and the sedation options and risks were                        discussed with the patient. All questions were                        answered, and informed consent was obtained. Prior                        Anticoagulants: The patient has taken no previous                        anticoagulant or antiplatelet agents. ASA Grade                        Assessment: III - A patient with severe systemic                        disease. After reviewing the risks and benefits, the                        patient was deemed in satisfactory condition to undergo                        the procedure.                       After obtaining informed consent, the colonoscope was  passed under direct vision. Throughout the procedure,                        the patient's blood pressure, pulse, and oxygen                        saturations were monitored continuously. The was                        introduced  through the descending colostomy and                        advanced to the the cecum, identified by appendiceal                        orifice and ileocecal valve. The colonoscopy was                        performed without difficulty. The patient tolerated the                        procedure well. The quality of the bowel preparation                        was excellent. The was introduced through the anus and                        advanced to the the descending colon to examine an                        anastomosis. This was the intended extent. Findings:      Multiple small-mouthed diverticula were found in the entire colon.      Non-bleeding internal hemorrhoids were found during retroflexion. The       hemorrhoids were Grade I (internal hemorrhoids that do not prolapse).      - Scope passed through the stoma and rectum.      A diffuse area of moderately erythematous mucosa was found in the       descending colon. Impression:           - Diverticulosis in the entire examined colon.                       - Non-bleeding internal hemorrhoids.                       - Scope passed through the stoma and rectum.                       - Diversion colitis.                       - No specimens collected. Recommendation:       - Return to referring physician as previously scheduled. Procedure Code(s):    --- Professional ---                       [phone], 53, Colonoscopy through stoma; diagnostic,                        including collection of specimen(s) by brushing or  washing, when performed (separate procedure) Diagnosis Code(s):    --- Professional ---                       Z61.096, Encounter for other preprocedural examination                       K55.039, Acute (reversible) ischemia of large                        intestine, extent unspecified CPT copyright 2016 American Medical Association. All rights reserved. The codes documented in this report are  preliminary and upon coder review may  be revised to meet current compliance requirements. Midge Minium MD, MD 01/31/2016 11:17:55 AM This report has been signed electronically. Number of Addenda: 0 Note Initiated On: 01/31/2016 10:44 AM Scope Withdrawal Time: 0 hours 11 minutes 8 seconds  Total Procedure Duration: 0 hours 12 minutes 42 seconds       [facility]

## 2016-01-31 NOTE — H&P (Signed)
Barbara Miniumarren Dula Havlik, MD [facility] [address] [place], [place] [address] Phone: [phone] Fax : [phone]  Primary Care Physician:  No PCP Per Patient Primary Gastroenterologist:  Dr. Servando SnareWohl  Pre-Procedure History & Physical: HPI:  Barbara Hawkins is a 41 y.o. female is here for an endoscopy and colonoscopy.   Past Medical History:  Diagnosis Date  . Arthritis    foot  . Collagen vascular disease (HCC)   . Diverticulitis   . Dysrhythmia    Benign PVCs and PACs  . Ehlers-Danlos disease   . GERD (gastroesophageal reflux disease)   . Raynaud's disease     Past Surgical History:  Procedure Laterality Date  . APPLICATION OF WOUND VAC Left 09/26/2015   Procedure: APPLICATION OF WOUND VAC;  Surgeon: Kieth BrightlySeeplaputhur G Sankar, MD;  Location: [facility] ORS;  Service: General;  Laterality: Left;  . APPLICATION OF WOUND VAC Left 10/15/2015   Procedure: APPLICATION OF WOUND VAC;  Surgeon: Tiney Rougealph Ely III, MD;  Location: [facility] ORS;  Service: General;  Laterality: Left;  . FOOT SURGERY    . INCISION AND DRAINAGE ABSCESS Left 09/26/2015   Procedure: INCISION AND DRAINAGE ABSCESS;  Surgeon: Kieth BrightlySeeplaputhur G Sankar, MD;  Location: [facility] ORS;  Service: General;  Laterality: Left;  . IRRIGATION AND DEBRIDEMENT ABSCESS Left 10/15/2015   Procedure: IRRIGATION AND DEBRIDEMENT LEG ABSCESS;  Surgeon: Tiney Rougealph Ely III, MD;  Location: [facility] ORS;  Service: General;  Laterality: Left;  . LAPAROSCOPY N/A 10/22/2015   Procedure: LAPAROSCOPY DIAGNOSTIC;  Surgeon: Ricarda Frameharles Woodham, MD;  Location: [facility] ORS;  Service: General;  Laterality: N/A;  . LAPAROTOMY  10/22/2015   Procedure: LAPAROTOMY;  Surgeon: Ricarda Frameharles Woodham, MD;  Location: [facility] ORS;  Service: General;;  . TRANSVERSE COLON RESECTION  10/22/2015   Procedure: TRANSVERSE COLON RESECTION WITH CREATION OF COLOSTOMY AND MUCUS FISTULA CREATION;  Surgeon: Ricarda Frameharles Woodham, MD;  Location: [facility] ORS;  Service: General;;  . TUBAL LIGATION Bilateral     Prior to Admission medications    Medication Sig Start Date End Date Taking? Authorizing Provider  acetaminophen (TYLENOL) 500 MG tablet Take 1,000 mg by mouth every 6 (six) hours as needed.   Yes Historical Provider, MD  Dexlansoprazole (DEXILANT PO) Take by mouth daily.   Yes Historical Provider, MD  omeprazole (PRILOSEC) 20 MG capsule Take 20 mg by mouth daily.    Historical Provider, MD    Allergies as of 01/27/2016 - Review Complete 01/27/2016  Allergen Reaction Noted  . Morphine Hives 05/26/2015  . Morphine and related Itching and Swelling 07/13/2015  . Septra [sulfamethoxazole-trimethoprim] Hives and Swelling 04/18/2013  . Sulfamethoxazole-trimethoprim Hives 05/26/2015    Family History  Problem Relation Age of Onset  . Stroke Mother   . Kidney disease Mother   . Hypertension Father   . Thyroid disease Father   . Stroke Brother     Social History   Social History  . Marital status: Legally Separated    Spouse name: N/A  . Number of children: N/A  . Years of education: N/A   Occupational History  . Not on file.   Social History Main Topics  . Smoking status: Never Smoker  . Smokeless tobacco: Never Used  . Alcohol use Yes     Comment: Holidays  . Drug use: No  . Sexual activity: Not on file   Other Topics Concern  . Not on file   Social History Narrative  . No narrative on file    Review of Systems: See HPI, otherwise negative  ROS  Physical Exam: Ht 5\' 6"  (1.676 m)   Wt 270 lb (122.5 kg)   LMP 12/24/2015 (Approximate) Comment: preg test negative  BMI 43.58 kg/m  General:   Alert,  pleasant and cooperative in NAD Head:  Normocephalic and atraumatic. Neck:  Supple; no masses or thyromegaly. Lungs:  Clear throughout to auscultation.    Heart:  Regular rate and rhythm. Abdomen:  Soft, nontender and nondistended. Normal bowel sounds, without guarding, and without rebound.   Neurologic:  Alert and  oriented x4;  grossly normal neurologically.  Impression/Plan: Barbara Hawkins is  here for an endoscopy and colonoscopy to be performed for GERD and history of ischemic colitis  Risks, benefits, limitations, and alternatives regarding  endoscopy and colonoscopy have been reviewed with the patient.  Questions have been answered.  All parties agreeable.   Barbara Minium, MD  01/31/2016, 10:11 AM

## 2016-01-31 NOTE — Op Note (Signed)
[facility] Gastroenterology Patient Name: Barbara Hawkins Procedure Date: 01/31/2016 10:44 AM MRN: [id number] Account #: [id number] Date of Birth: 04-Aug-1975 Admit Type: Outpatient Age: 41 Room: [facility] OR ROOM 01 Gender: Female Note Status: Finalized Procedure:            Upper GI endoscopy Indications:          Heartburn Providers:            Midge Minium MD, MD Referring MD:         Ricarda Frame, MD (Referring MD) Medicines:            Propofol per Anesthesia Complications:        No immediate complications. Procedure:            Pre-Anesthesia Assessment:                       - Prior to the procedure, a History and Physical was                        performed, and patient medications and allergies were                        reviewed. The patient's tolerance of previous                        anesthesia was also reviewed. The risks and benefits of                        the procedure and the sedation options and risks were                        discussed with the patient. All questions were                        answered, and informed consent was obtained. Prior                        Anticoagulants: The patient has taken no previous                        anticoagulant or antiplatelet agents. ASA Grade                        Assessment: II - A patient with mild systemic disease.                        After reviewing the risks and benefits, the patient was                        deemed in satisfactory condition to undergo the                        procedure.                       After obtaining informed consent, the endoscope was                        passed under direct vision. Throughout the procedure,  the patient's blood pressure, pulse, and oxygen                        saturations were monitored continuously. The Olympus                        GIF H180J colonscope (J#:[id number](S#:[id number]) was introduced                        through the  mouth, and advanced to the second part of                        duodenum. The upper GI endoscopy was accomplished                        without difficulty. The patient tolerated the procedure                        well. Findings:      The examined esophagus was normal.      The stomach was normal.      The examined duodenum was normal. Impression:           - Normal esophagus.                       - Normal stomach.                       - Normal examined duodenum.                       - No specimens collected. Recommendation:       - Discharge patient to home.                       - Resume previous diet.                       - Continue present medications.                       - Perform a colonoscopy today. Procedure Code(s):    --- Professional ---                       [phone], Esophagogastroduodenoscopy, flexible, transoral;                        diagnostic, including collection of specimen(s) by                        brushing or washing, when performed (separate procedure) Diagnosis Code(s):    --- Professional ---                       R12, Heartburn CPT copyright 2016 American Medical Association. All rights reserved. The codes documented in this report are preliminary and upon coder review may  be revised to meet current compliance requirements. Midge Miniumarren Shaena Parkerson MD, MD 01/31/2016 10:57:02 AM This report has been signed electronically. Number of Addenda: 0 Note Initiated On: 01/31/2016 10:44 AM Total Procedure Duration: 0 hours 1 minute 42 seconds       [facility]

## 2016-02-01 ENCOUNTER — Encounter: Payer: Self-pay | Admitting: Gastroenterology

## 2016-02-03 ENCOUNTER — Ambulatory Visit (INDEPENDENT_AMBULATORY_CARE_PROVIDER_SITE_OTHER): Payer: BLUE CROSS/BLUE SHIELD | Admitting: General Surgery

## 2016-02-03 ENCOUNTER — Encounter: Payer: Self-pay | Admitting: General Surgery

## 2016-02-03 VITALS — BP 142/92 | HR 78 | Temp 98.2°F | Ht 66.0 in | Wt 273.6 lb

## 2016-02-03 DIAGNOSIS — Q796 Ehlers-Danlos syndrome, unspecified: Secondary | ICD-10-CM

## 2016-02-03 DIAGNOSIS — Z933 Colostomy status: Secondary | ICD-10-CM | POA: Diagnosis not present

## 2016-02-03 DIAGNOSIS — S71102D Unspecified open wound, left thigh, subsequent encounter: Secondary | ICD-10-CM

## 2016-02-03 NOTE — Progress Notes (Signed)
Outpatient Surgical Follow Up  02/03/2016  Barbara Hawkins is an 41 y.o. female.   Chief Complaint  Patient presents with  . Follow-up    Discuss Colostomy Takedown    HPI: 41 year old female that is well-known to the surgery service returns to clinic today to discuss her colostomy takedown. She had a colonoscopy performed earlier this week which showed pan diverticulosis but no other findings. She tolerated the colonoscopy and its prep well. She has been eating well and having good ostomy function. She continues to have some leakage with ambulation around her ostomy site. Her physical activity tolerance has increased dramatically over the last month and she thinks she is close to her baseline. She has been able to lose weight intentionally as well. She also has a chronic open wound to the left posterior thigh. It had closed up but now has a small area has reopened with some minimal drainage. There is no evidence of purulence or spreading erythema per her. She denies any fevers, chills, nausea, vomiting, chest pain, shortness of breath. She is very excited about the possibility of getting rid of her colostomy.  Past Medical History:  Diagnosis Date  . Arthritis    foot  . Collagen vascular disease (HCC)   . Diverticulitis   . Dysrhythmia    Benign PVCs and PACs  . Ehlers-Danlos disease   . GERD (gastroesophageal reflux disease)   . Raynaud's disease     Past Surgical History:  Procedure Laterality Date  . APPLICATION OF WOUND VAC Left 09/26/2015   Procedure: APPLICATION OF WOUND VAC;  Surgeon: Barbara Brightly, MD;  Location: [facility] OR[facility];  Service: General;  Laterality: Left;  . APPLICATION OF WOUND VAC Left 10/15/2015   Procedure: APPLICATION OF WOUND VAC;  Surgeon: Barbara Rouge III, MD;  Location: [facility] OR[facility];  Service: General;  Laterality: Left;  . COLONOSCOPY WITH PROPOFOL N/A 01/31/2016   Procedure: COLONOSCOPY WITH PROPOFOL;  Surgeon: Barbara Minium, MD;  Location[facility];   Service: Endoscopy;  Laterality: N/A;  . ESOPHAGOGASTRODUODENOSCOPY (EGD) WITH PROPOFOL N/A 01/31/2016   Procedure: ESOPHAGOGASTRODUODENOSCOPY (EGD) WITH PROPOFOL;  Surgeon: Barbara Minium, MD;  Location: [facility];  Service: Endoscopy;  Laterality: N/A;  . FOOT SURGERY    . INCISION AND DRAINAGE ABSCESS Left 09/26/2015   Procedure: INCISION AND DRAINAGE ABSCESS;  Surgeon: Barbara Brightly, MD;  Location: [facility];  Service: General;  Laterality: Left;  . IRRIGATION AND DEBRIDEMENT ABSCESS Left 10/15/2015   Procedure: IRRIGATION AND DEBRIDEMENT LEG ABSCESS;  Surgeon: Barbara Rouge III, MD;  Location: [facility];  Service: General;  Laterality: Left;  . LAPAROSCOPY N/A 10/22/2015   Procedure: LAPAROSCOPY DIAGNOSTIC;  Surgeon: Barbara Frame, MD;  Location: [facility];  Service: General;  Laterality: N/A;  . LAPAROTOMY  10/22/2015   Procedure: LAPAROTOMY;  Surgeon: Barbara Frame, MD;  Location: [facility];  Service: General;;  . TRANSVERSE COLON RESECTION  10/22/2015   Procedure: TRANSVERSE COLON RESECTION WITH CREATION OF COLOSTOMY AND MUCUS FISTULA CREATION;  Surgeon: Barbara Frame, MD;  Location: [facility];  Service: General;;  . TUBAL LIGATION Bilateral     Family History  Problem Relation Age of Onset  . Stroke Mother   . Kidney disease Mother   . Hypertension Father   . Thyroid disease Father   . Stroke Brother     Social History:  reports that she has never smoked. She has never used smokeless tobacco. She reports that she drinks alcohol. She reports that she does  not use drugs.  Allergies:  Allergies  Allergen Reactions  . Morphine Hives, Itching and Swelling  . Septra [Sulfamethoxazole-Trimethoprim] Hives, Itching and Swelling    Medications reviewed.    ROS A multipoint review of systems was completed. All pertinent positives and negatives are documented within the history of present illness and remainder are negative.   BP (!) 142/92   Pulse 78   Temp 98.2 F (36.8  C) (Oral)   Ht 5\' 6"  (1.676 m)   Wt 124.1 kg (273 lb 9.6 oz)   LMP 12/24/2015 (Approximate) Comment: preg test negative  BMI 44.16 kg/m   Physical Exam Gen.: No acute distress Neck: Supple and nontender Chest: Clear to auscultation without accessory muscle usage Heart: Regular rate and rhythm Abdomen: Large, soft, nontender, nondistended. Well healed midline incision. Left upper quadrant Mucous fistula site has closed up without any active drainage or signs of infection. Right upper quadrant end colostomy in place that is pink, patent, productive of gas and stool. Skin: Left posterior thigh with a 5 mm opening with some serous output. No evidence of erythema or purulence. Extremities: Moves all extremities well Psych: Alert, oriented, appropriate    No results found for this or any previous visit (from the past 48 hour(s)). No results found.  Assessment/Plan:  1. Colostomy in place Barbara Hawkins(HCC) 41 year old female with an end colostomy that has been in place now for approximately 3 months. Functioning well but still having issues with back seal. Discussed her colonoscopy results as well as the surgical process of reversing her colostomy. Had a long conversation about the high risk nature of undergoing surgery to reverse her colostomy due to her collagen vascular disease as well as the multiple diverticulum seen on the colonoscopy. Discussed in detail the risks of the surgery, the risks of the anastomosis, the likely need for an ileostomy, the high risk for leaking, the high risk for need multiple operations. Due to these high risks discussed providing her with a referral to a tertiary care center. In spite of this long conversation patient states she's willing to accept the risks at our facility for us to perform her ostomy takedown. Tentatively plan for an open ostomy takedown and reversal with likely ileostomy placement for February 14 with my partner Barbara Hawkins to assist. She will need a full  bowel prep. She'll return to clinic for additional follow-up prior to the date of surgery. This entire conversation occurred with by nursing staff present.  2. Ehlers-Danlos disease Patient now with insurance, this is being followed by her medical providers.  3. Open wound of left thigh, subsequent encounter The open bladder left wound is much improved from last time I saw. A new opening was treated with silver nitrate today. She is to continue local dry dressings until the drainage output since. It is open at an area skin fold during ambulation. This should continue to heal.   Greater than 40 minutes his usual this encounter with greater than 50% of the encounter used for counseling and coordination of care.  Barbara Frameharles Andriana Casa, MD FACS General Surgeon  02/03/2016,10:05 AM

## 2016-02-03 NOTE — Patient Instructions (Addendum)
We have spoken today about reversing your colostomy and mucous fistula. You are requesting to have this done.  We will arrange this to be done on 03/08/16 by Dr. Tonita Cong and Dr. Everlene Farrier at [facility].   Plan on being in the hospital between 5-7 days after surgery. You will be started on a liquid diet and then advanced as tolerated prior to going home.  If you have any disability or FMLA paperwork that needs to be filled out for your employer, please bring this in prior to surgery and it will be filled out upon your discharge from the hospital. We can give you a note anticipating your surgery date. If your employer is in need of this, please let us know.  To prep for your surgery, You will need to complete a bowel prep, 2 antibiotics, and a fleets enema prior to surgery. Your antibiotics are Neomycin and Erythromycin and these will be taken at 8am, 2pm, 8pm on the day of your prep. (Please see the Bowel sheet provided)  I will contact [name] (Ostomy Nurse) and have her available for skin marking and Ileostomy Preparation, as a possibility that this may occur during surgery.  We will see you back in the office right before your surgery so that we can review necessary details and you may ask any questions that you may have. Please write these questions down so that you do not forget them.  Please see your [facility]) Pre-Care Sheet for more information. If you have any questions, please call our office and ask for a nurse.  End Colostomy Reversal An end colostomy reversal is surgery that reverses an end colostomy. The large intestine is disconnected from the opening in the abdomen (stoma). Then it is reconnected to the large intestine inside the body. A stoma and pouch are no longer needed. Bowel movements can resume through the rectum. LET [facility] CARE PROVIDER KNOW ABOUT:  Allergies to food or medicine.  Medicines taken, including vitamins, health supplements, herbs, eye drops, over-the-counter  medicines, and creams.  Use of steroids (by mouth or creams).  Previous problems with anesthetics or numbing medicines.  History of bleeding problems or blood clots.  Previous surgery.  Other health problems, including diabetes and kidney problems.  Possibility of pregnancy, if this applies. RISKS AND COMPLICATIONS General surgical complications may include the following:  Reaction to anesthetics.  Damage to surrounding nerves, tissues, or structures.  Blood clot.  Bleeding.  Scarring. Specific risks for colostomy reversal, while rare, may include:  Intestinal paralysis (ileus). This is a normal part of recovery. It usually goes away in 3-7 days. However, it can last longer in some people.  Leaking at the joined part of the intestine (anastomotic leak).  Infection of the surgical cut (incision) or the place where the stoma was located.  A collection of pus (abscess) in the abdomen or pelvis.  Intestinal blockage.  Narrowing at the joined part of the intestine (stricture).  Urinary and sexual dysfunction. BEFORE THE PROCEDURE It is important to follow your health care provider's instructions prior to your procedure. This will help you to avoid complications. Steps before your procedure may include:  A physical exam, rectal exam, X-rays, colonoscopy, and other procedures.  Chemotherapy or radiation therapy, if the stoma was created due to cancer.  A review of the procedure, the anesthetic being used, and what to expect after the procedure. You may be asked to:  Stop taking certain medicines for several days prior to your procedure. These  may include blood thinners (such as aspirin).  Take certain medicines, such as antibiotics or stool softeners.  Avoid eating and drinking after midnight the night before the procedure. This will help you to avoid complications from the anesthetic.  Quit smoking. Smoking increases the chances of a healing problem after your  procedure. PROCEDURE You will be given medicine that makes you sleep (general anesthetic). The procedure may be done as open surgery, with a large incision. It may also be done as laparoscopic surgery, with several smaller incisions. The surgeon will stitch or staple the intestine ends back together. This surgery takes several hours. AFTER THE PROCEDURE  You will be given pain medicine.  Slowly increase your diet and movement as directed by your health care provider.  You should arrange for someone to help you with activities at home while you recover.   This information is not intended to replace advice given to you by your health care provider. Make sure you discuss any questions you have with your health care provider.   Document Released: 04/03/2011 Document Revised: 05/26/2014 Document Reviewed: 04/03/2011 Elsevier Interactive Patient Education [employer]2016 Elsevier Inc.

## 2016-02-04 ENCOUNTER — Telehealth: Payer: Self-pay

## 2016-02-04 NOTE — Telephone Encounter (Signed)
Patient called in and states that she is returning your phone call regarding her surgery information. Please call.

## 2016-02-07 NOTE — Telephone Encounter (Signed)
I have called patient back to discuss surgery date. No answer. I have left a message on voicemail for patient to return call.

## 2016-02-07 NOTE — Telephone Encounter (Signed)
Pt advised of pre op date/time and sx date. Sx: 03/08/16 with Dr Sedonia SmallWoodham--open colostomy reversal with possible loop ileostomy creation.  Pre op: 03/01/16 @ 10:15am--office.   Patient made aware to call [phone], between 1-3:00pm the day before surgery, to find out what time to arrive.

## 2016-02-09 ENCOUNTER — Other Ambulatory Visit: Payer: Self-pay

## 2016-02-09 DIAGNOSIS — R198 Other specified symptoms and signs involving the digestive system and abdomen: Secondary | ICD-10-CM

## 2016-02-09 MED ORDER — ERYTHROMYCIN BASE 500 MG PO TABS: 1000.0000 mg | ORAL_TABLET | Freq: Three times a day (TID) | ORAL | 0 refills | Status: DC

## 2016-02-09 MED ORDER — BISACODYL 5 MG PO TBEC: 20.0000 mg | DELAYED_RELEASE_TABLET | Freq: Once | ORAL | 0 refills | Status: AC

## 2016-02-09 MED ORDER — POLYETHYLENE GLYCOL 3350 17 GM/SCOOP PO POWD: 1.0000 | Freq: Once | ORAL | 0 refills | Status: AC

## 2016-02-09 MED ORDER — FLEET ENEMA 7-19 GM/118ML RE ENEM: 1.0000 | ENEMA | Freq: Once | RECTAL | 0 refills | Status: AC

## 2016-02-09 MED ORDER — NEOMYCIN SULFATE 500 MG PO TABS: 1000.0000 mg | ORAL_TABLET | Freq: Three times a day (TID) | ORAL | 0 refills | Status: DC

## 2016-02-15 ENCOUNTER — Other Ambulatory Visit: Payer: Self-pay

## 2016-02-15 ENCOUNTER — Telehealth: Payer: Self-pay | Admitting: Gastroenterology

## 2016-02-15 MED ORDER — DEXLANSOPRAZOLE 60 MG PO CPDR: 60.0000 mg | DELAYED_RELEASE_CAPSULE | Freq: Every day | ORAL | 6 refills | Status: DC

## 2016-02-15 NOTE — Telephone Encounter (Signed)
Pt notified Rx was sent to pharmacy. 

## 2016-02-15 NOTE — Telephone Encounter (Signed)
Patient needs a RX for Dexilant 60 mg [facility] on [address]

## 2016-03-01 ENCOUNTER — Ambulatory Visit
Admission: RE | Admit: 2016-03-01 | Discharge: 2016-03-01 | Disposition: A | Discharge status: Home / Self Care | Payer: BLUE CROSS/BLUE SHIELD | Source: Ambulatory Visit | Attending: General Surgery | Admitting: General Surgery

## 2016-03-01 ENCOUNTER — Encounter
Admission: RE | Admit: 2016-03-01 | Discharge: 2016-03-01 | Disposition: A | Discharge status: Home / Self Care | Payer: BLUE CROSS/BLUE SHIELD | Source: Ambulatory Visit | Attending: General Surgery | Admitting: General Surgery

## 2016-03-01 DIAGNOSIS — Z01810 Encounter for preprocedural cardiovascular examination: Secondary | ICD-10-CM | POA: Insufficient documentation

## 2016-03-01 DIAGNOSIS — Z01812 Encounter for preprocedural laboratory examination: Secondary | ICD-10-CM | POA: Insufficient documentation

## 2016-03-01 DIAGNOSIS — Z01818 Encounter for other preprocedural examination: Secondary | ICD-10-CM | POA: Diagnosis present

## 2016-03-01 HISTORY — DX: Panic disorder (episodic paroxysmal anxiety): F41.0

## 2016-03-01 HISTORY — DX: Headache: R51

## 2016-03-01 HISTORY — DX: Headache, unspecified: R51.9

## 2016-03-01 HISTORY — DX: Depression, unspecified: F32.A

## 2016-03-01 HISTORY — DX: Anxiety disorder, unspecified: F41.9

## 2016-03-01 HISTORY — DX: Noninfective gastroenteritis and colitis, unspecified: K52.9

## 2016-03-01 HISTORY — DX: Major depressive disorder, single episode, unspecified: F32.9

## 2016-03-01 LAB — CBC WITH DIFFERENTIAL/PLATELET
BASOS ABS: 0.1 10*3/uL (ref 0–0.1)
Basophils Relative: 1 %
EOS PCT: 1 %
Eosinophils Absolute: 0.1 10*3/uL (ref 0–0.7)
HCT: 38.3 % (ref 35.0–47.0)
Hemoglobin: 12.9 g/dL (ref 12.0–16.0)
LYMPHS ABS: 2 10*3/uL (ref 1.0–3.6)
LYMPHS PCT: 29 %
MCH: 28.8 pg (ref 26.0–34.0)
MCHC: 33.6 g/dL (ref 32.0–36.0)
MCV: 85.6 fL (ref 80.0–100.0)
MONO ABS: 0.4 10*3/uL (ref 0.2–0.9)
Monocytes Relative: 6 %
Neutro Abs: 4.3 10*3/uL (ref 1.4–6.5)
Neutrophils Relative %: 63 %
PLATELETS: 170 10*3/uL (ref 150–440)
RBC: 4.48 MIL/uL (ref 3.80–5.20)
RDW: 15.5 % — AB (ref 11.5–14.5)
WBC: 6.9 10*3/uL (ref 3.6–11.0)

## 2016-03-01 LAB — COMPREHENSIVE METABOLIC PANEL
ALT: 11 U/L — ABNORMAL LOW (ref 14–54)
ANION GAP: 6 (ref 5–15)
AST: 13 U/L — ABNORMAL LOW (ref 15–41)
Albumin: 4.1 g/dL (ref 3.5–5.0)
Alkaline Phosphatase: 84 U/L (ref 38–126)
BUN: 17 mg/dL (ref 6–20)
CHLORIDE: 106 mmol/L (ref 101–111)
CO2: 28 mmol/L (ref 22–32)
Calcium: 8.9 mg/dL (ref 8.9–10.3)
Creatinine, Ser: 0.59 mg/dL (ref 0.44–1.00)
Glucose, Bld: 92 mg/dL (ref 65–99)
POTASSIUM: 3.8 mmol/L (ref 3.5–5.1)
Sodium: 140 mmol/L (ref 135–145)
Total Bilirubin: 0.4 mg/dL (ref 0.3–1.2)
Total Protein: 7.8 g/dL (ref 6.5–8.1)

## 2016-03-01 LAB — SURGICAL PCR SCREEN
MRSA, PCR: NEGATIVE
STAPHYLOCOCCUS AUREUS: POSITIVE — AB

## 2016-03-01 LAB — PROTIME-INR
INR: 0.94
PROTHROMBIN TIME: 12.6 s (ref 11.4–15.2)

## 2016-03-01 LAB — APTT: APTT: 24 s (ref 24–36)

## 2016-03-01 NOTE — Patient Instructions (Signed)
  Your procedure is scheduled on: March 08, 2016 (Wednesday) Report to Same Day Surgery 2nd floor medical mall [facility](Medical Mall Entrance-take elevator on left to 2nd floor.  Check in with surgery information desk.) To find out your arrival time please call [phone] between 1PM - 3PM on March 07, 2016 (Tuesday)  Remember: Instructions that are not followed completely may result in serious medical risk, up to and including death, or upon the discretion of your surgeon and anesthesiologist your surgery may need to be rescheduled.    _x___ 1. Do not eat food or drink liquids after midnight. No gum chewing or hard candies.     __x__ 2. No Alcohol for 24 hours before or after surgery.   __x__3. No Smoking for 24 prior to surgery.   ____  4. Bring all medications with you on the day of surgery if instructed.    __x__ 5. Notify your doctor if there is any change in your medical condition     (cold, fever, infections).     Do not wear jewelry, make-up, hairpins, clips or nail polish.  Do not wear lotions, powders, or perfumes. You may wear deodorant.  Do not shave 48 hours prior to surgery. Men may shave face and neck.  Do not bring valuables to the hospital.    [facility] is not responsible for any belongings or valuables.               Contacts, dentures or bridgework may not be worn into surgery.  Leave your suitcase in the car. After surgery it may be brought to your room.  For patients admitted to the hospital, discharge time is determined by your treatment team.   Patients discharged the day of surgery will not be allowed to drive home.  You will need someone to drive you home and stay with you the night of your procedure.    Please read over the following fact sheets that you were given:   [facility] Preparing for Surgery and or MRSA Information   _x___ Take these medicines the morning of surgery with A SIP OF WATER:    1. Dexilant  2. Follow Dr. Tonita CongWoodham Colon Prep  Instructions  3.  4.  5.  6.  _x___Fleets enema or Magnesium Citrate as directed. (Fleet Enema x 2 rectally, one the night before surgery and one the morning of surgery prior to coming to the hospital)  _x___ Use CHG Soap or sage wipes as directed on instruction sheet   ____ Use inhalers on the day of surgery and bring to hospital day of surgery  ____ Stop metformin 2 days prior to surgery    ____ Take 1/2 of usual insulin dose the night before surgery and none on the morning of  surgery          _x___ Stop Aspirin, Coumadin, Pllavix ,Eliquis, Effient, or Pradaxa  x__ Stop Anti-inflammatories such as Advil, Aleve, Ibuprofen, Motrin, Naproxen,          Naprosyn, Goodies powders or aspirin products. Ok to take Tylenol.   ____ Stop supplements until after surgery.    ____ Bring C-Pap to the hospital.

## 2016-03-01 NOTE — Consult Note (Signed)
WOC Nurse requested for preoperative stoma site marking  Discussed surgical procedure and stoma creation with patient.  Explained role of the WOC nurse team.  Provided the patient with educational booklet/DVD and provided samples of pouching options.  Answered patient  questions. Patient has R mid quadrant colostomy at this time.  Awaiting takedown in one week and may have an ileostomy.  Marking for that today.  Patient states she has had a lot of pouching difficulties.  States she believes this is because her pouch was higher (above umbilicus) and is is poorly visualized due to her breasts.  Abdomen is pendulous and explained that a lower quadrant stoma may not be possible.    Examined patient lying, sitting, and standing in order to place the marking in the patient's visual field, away from any creases or abdominal contour issues and within the rectus muscle.  Attempted to mark below the patient's belt line.    Marked for ileostomy in the RLQ  5 cm to the right of the umbilicus and 3 cm below the umbilicus.  The existing colostomy is 4 cm right and 2 cm above umbilicus.  Patient felt this proposed site gave her visual ability to pouch.  Explained that the best possible site would be chosen.    Patient's abdomen cleansed with CHG wipes at site markings, allowed to air dry prior to marking.Covered mark with thin film transparent dressing to preserve mark until date of surgery.   WOC Nurse team will follow up with patient after surgery for continue ostomy care and teaching.  WOC team will follow and remain available to patient, medical and nursing teams.  Maple HudsonKaren Rozalynn Buege RN BSN CWON Pager [phone]

## 2016-03-01 NOTE — Pre-Procedure Instructions (Signed)
EKG COMPARED WITH 10/22/15

## 2016-03-01 NOTE — Pre-Procedure Instructions (Signed)
Anesthesia consult ordered by Dr. Vladimir CreeksWoodham--Dr. Thomas in to see patient.

## 2016-03-03 ENCOUNTER — Telehealth: Payer: Self-pay

## 2016-03-03 NOTE — Telephone Encounter (Signed)
Notified by Pre-admit that surgical PCR Screen was positive for Staph Aureus.   Per protocol, no further orders at this time.

## 2016-03-06 ENCOUNTER — Encounter: Payer: Self-pay | Admitting: General Surgery

## 2016-03-06 ENCOUNTER — Ambulatory Visit (INDEPENDENT_AMBULATORY_CARE_PROVIDER_SITE_OTHER): Payer: BLUE CROSS/BLUE SHIELD | Admitting: General Surgery

## 2016-03-06 VITALS — BP 128/86 | HR 73 | Temp 98.2°F | Ht 66.0 in | Wt 270.0 lb

## 2016-03-06 DIAGNOSIS — Z933 Colostomy status: Secondary | ICD-10-CM | POA: Diagnosis not present
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
## 2016-03-06 NOTE — Patient Instructions (Signed)
We have your surgery scheduled 03/08/16 with Dr.Woodham. Please call if you have any questions or concerns. Please follow your bowel prep instruction beginning today.

## 2016-03-07 ENCOUNTER — Telehealth: Payer: Self-pay

## 2016-03-07 MED ORDER — ONDANSETRON 4 MG PO TBDP: 4.0000 mg | ORAL_TABLET | Freq: Three times a day (TID) | ORAL | 0 refills | Status: DC | PRN

## 2016-03-07 NOTE — Telephone Encounter (Signed)
Patient is having surgery tomorrow. She is doing a clear liquid diet the bowel prep today. This morning she took her antibiotics and now she is feeling extremely nauseous. She is scheduled to take her second one at 2 and she is wondering if there is anything she can take for the nausea like toms. Please call patient and advice.

## 2016-03-07 NOTE — Telephone Encounter (Signed)
Returned phone call to patient at this time. I explained to patient that Zofran has been sent in to pharmacy at this time and this should help with the nausea throughout today. She verbalizes understanding of this.

## 2016-03-08 ENCOUNTER — Encounter: Payer: Self-pay | Admitting: Anesthesiology

## 2016-03-08 ENCOUNTER — Inpatient Hospital Stay
Admission: RE | Admit: 2016-03-08 | Discharge: 2016-03-19 | DRG: 330 | Disposition: A | Discharge status: Home Health | Payer: BLUE CROSS/BLUE SHIELD | Source: Ambulatory Visit | Attending: General Surgery | Admitting: General Surgery

## 2016-03-08 ENCOUNTER — Inpatient Hospital Stay: Payer: BLUE CROSS/BLUE SHIELD | Admitting: Anesthesiology

## 2016-03-08 ENCOUNTER — Encounter
Admission: RE | Disposition: A | Discharge status: Home Health | Payer: Self-pay | Source: Ambulatory Visit | Attending: General Surgery

## 2016-03-08 DIAGNOSIS — Z433 Encounter for attention to colostomy: Principal | ICD-10-CM

## 2016-03-08 DIAGNOSIS — I7300 Raynaud's syndrome without gangrene: Secondary | ICD-10-CM | POA: Diagnosis present

## 2016-03-08 DIAGNOSIS — Z79899 Other long term (current) drug therapy: Secondary | ICD-10-CM

## 2016-03-08 DIAGNOSIS — K567 Ileus, unspecified: Secondary | ICD-10-CM

## 2016-03-08 DIAGNOSIS — F329 Major depressive disorder, single episode, unspecified: Secondary | ICD-10-CM | POA: Diagnosis present

## 2016-03-08 DIAGNOSIS — K219 Gastro-esophageal reflux disease without esophagitis: Secondary | ICD-10-CM | POA: Diagnosis present

## 2016-03-08 DIAGNOSIS — F419 Anxiety disorder, unspecified: Secondary | ICD-10-CM | POA: Diagnosis present

## 2016-03-08 DIAGNOSIS — Z9889 Other specified postprocedural states: Secondary | ICD-10-CM

## 2016-03-08 DIAGNOSIS — IMO0002 Reserved for concepts with insufficient information to code with codable children: Secondary | ICD-10-CM

## 2016-03-08 DIAGNOSIS — Q796 Ehlers-Danlos syndrome: Secondary | ICD-10-CM

## 2016-03-08 DIAGNOSIS — Z4659 Encounter for fitting and adjustment of other gastrointestinal appliance and device: Secondary | ICD-10-CM

## 2016-03-08 DIAGNOSIS — R112 Nausea with vomiting, unspecified: Secondary | ICD-10-CM | POA: Diagnosis not present

## 2016-03-08 DIAGNOSIS — Z6841 Body Mass Index (BMI) 40.0 and over, adult: Secondary | ICD-10-CM | POA: Diagnosis not present

## 2016-03-08 DIAGNOSIS — K910 Vomiting following gastrointestinal surgery: Secondary | ICD-10-CM

## 2016-03-08 HISTORY — PX: ILEOSTOMY: SHX1783

## 2016-03-08 HISTORY — PX: COLOSTOMY REVERSAL: SHX5782

## 2016-03-08 LAB — POCT PREGNANCY, URINE: PREG TEST UR: NEGATIVE

## 2016-03-08 SURGERY — COLOSTOMY REVERSAL
Anesthesia: General | Wound class: Clean Contaminated

## 2016-03-08 MED ORDER — ONDANSETRON HCL 4 MG/2ML IJ SOLN
INTRAMUSCULAR | Status: AC
  Administered 2016-03-08: 4 mg via INTRAVENOUS
  Filled 2016-03-08: qty 2

## 2016-03-08 MED ORDER — SEVOFLURANE IN SOLN
RESPIRATORY_TRACT | Status: AC
  Filled 2016-03-08: qty 250

## 2016-03-08 MED ORDER — DEXTROSE 5 % IV SOLN
2.0000 g | INTRAVENOUS | Status: AC
  Administered 2016-03-08: 2 g via INTRAVENOUS
  Filled 2016-03-08: qty 2

## 2016-03-08 MED ORDER — PHENYLEPHRINE HCL 10 MG/ML IJ SOLN
INTRAMUSCULAR | Status: DC | PRN
  Administered 2016-03-08 (×3): 100 ug via INTRAVENOUS

## 2016-03-08 MED ORDER — ONDANSETRON HCL 4 MG/2ML IJ SOLN
INTRAMUSCULAR | Status: DC | PRN
  Administered 2016-03-08: 4 mg via INTRAVENOUS

## 2016-03-08 MED ORDER — BUPIVACAINE HCL (PF) 0.5 % IJ SOLN
INTRAMUSCULAR | Status: AC
  Filled 2016-03-08: qty 30

## 2016-03-08 MED ORDER — ROCURONIUM BROMIDE 50 MG/5ML IV SOLN
INTRAVENOUS | Status: AC
  Filled 2016-03-08: qty 1

## 2016-03-08 MED ORDER — ENOXAPARIN SODIUM 40 MG/0.4ML ~~LOC~~ SOLN
40.0000 mg | SUBCUTANEOUS | Status: DC
  Administered 2016-03-09 – 2016-03-19 (×11): 40 mg via SUBCUTANEOUS
  Filled 2016-03-08 (×11): qty 0.4

## 2016-03-08 MED ORDER — MIDAZOLAM HCL 2 MG/2ML IJ SOLN
INTRAMUSCULAR | Status: AC
  Filled 2016-03-08: qty 2

## 2016-03-08 MED ORDER — ROCURONIUM BROMIDE 100 MG/10ML IV SOLN
INTRAVENOUS | Status: DC | PRN
  Administered 2016-03-08 (×2): 10 mg via INTRAVENOUS
  Administered 2016-03-08: 40 mg via INTRAVENOUS
  Administered 2016-03-08: 20 mg via INTRAVENOUS
  Administered 2016-03-08 (×2): 10 mg via INTRAVENOUS

## 2016-03-08 MED ORDER — CHLORHEXIDINE GLUCONATE CLOTH 2 % EX PADS: 6.0000 | MEDICATED_PAD | Freq: Once | CUTANEOUS | Status: DC

## 2016-03-08 MED ORDER — ONDANSETRON HCL 4 MG/2ML IJ SOLN
4.0000 mg | Freq: Once | INTRAMUSCULAR | Status: AC | PRN
  Administered 2016-03-08: 4 mg via INTRAVENOUS

## 2016-03-08 MED ORDER — ONDANSETRON HCL 4 MG/2ML IJ SOLN
4.0000 mg | Freq: Four times a day (QID) | INTRAMUSCULAR | Status: DC | PRN
  Administered 2016-03-08 – 2016-03-18 (×16): 4 mg via INTRAVENOUS
  Filled 2016-03-08 (×16): qty 2

## 2016-03-08 MED ORDER — DEXAMETHASONE SODIUM PHOSPHATE 10 MG/ML IJ SOLN
INTRAMUSCULAR | Status: DC | PRN
  Administered 2016-03-08: 8 mg via INTRAVENOUS

## 2016-03-08 MED ORDER — LIDOCAINE HCL (PF) 2 % IJ SOLN
INTRAMUSCULAR | Status: AC
  Filled 2016-03-08: qty 2

## 2016-03-08 MED ORDER — HYDROMORPHONE HCL 1 MG/ML IJ SOLN
1.0000 mg | INTRAMUSCULAR | Status: DC | PRN
  Administered 2016-03-08 – 2016-03-17 (×7): 1 mg via INTRAVENOUS
  Filled 2016-03-08 (×6): qty 1

## 2016-03-08 MED ORDER — MIDAZOLAM HCL 2 MG/2ML IJ SOLN
INTRAMUSCULAR | Status: DC | PRN
  Administered 2016-03-08: 2 mg via INTRAVENOUS

## 2016-03-08 MED ORDER — PROPOFOL 10 MG/ML IV BOLUS
INTRAVENOUS | Status: AC
  Filled 2016-03-08: qty 20

## 2016-03-08 MED ORDER — LIDOCAINE HCL (CARDIAC) 20 MG/ML IV SOLN
INTRAVENOUS | Status: DC | PRN
  Administered 2016-03-08: 60 mg via INTRAVENOUS

## 2016-03-08 MED ORDER — OXYCODONE-ACETAMINOPHEN 5-325 MG PO TABS
1.0000 | ORAL_TABLET | Freq: Four times a day (QID) | ORAL | Status: DC | PRN
  Administered 2016-03-08 (×2): 1 via ORAL
  Administered 2016-03-09 (×3): 2 via ORAL
  Administered 2016-03-09: 1 via ORAL
  Administered 2016-03-10: 2 via ORAL
  Administered 2016-03-10: 1 via ORAL
  Administered 2016-03-11 – 2016-03-12 (×4): 2 via ORAL
  Administered 2016-03-12: 1 via ORAL
  Administered 2016-03-12 – 2016-03-13 (×2): 2 via ORAL
  Administered 2016-03-13: 1 via ORAL
  Administered 2016-03-13: 2 via ORAL
  Administered 2016-03-14: 1 via ORAL
  Administered 2016-03-14 – 2016-03-18 (×8): 2 via ORAL
  Filled 2016-03-08 (×7): qty 2
  Filled 2016-03-08 (×3): qty 1
  Filled 2016-03-08: qty 2
  Filled 2016-03-08 (×3): qty 1
  Filled 2016-03-08: qty 2
  Filled 2016-03-08: qty 1
  Filled 2016-03-08: qty 2
  Filled 2016-03-08: qty 1
  Filled 2016-03-08 (×9): qty 2

## 2016-03-08 MED ORDER — SODIUM CHLORIDE 0.9 % IV SOLN
INTRAVENOUS | Status: DC | PRN
  Administered 2016-03-08: 12:00:00 via INTRAVENOUS

## 2016-03-08 MED ORDER — FENTANYL CITRATE (PF) 250 MCG/5ML IJ SOLN
INTRAMUSCULAR | Status: AC
  Filled 2016-03-08: qty 5

## 2016-03-08 MED ORDER — FENTANYL CITRATE (PF) 100 MCG/2ML IJ SOLN
INTRAMUSCULAR | Status: AC
  Administered 2016-03-08: 25 ug via INTRAVENOUS
  Filled 2016-03-08: qty 2

## 2016-03-08 MED ORDER — LACTATED RINGERS IV SOLN
INTRAVENOUS | Status: DC
  Administered 2016-03-08 (×3): via INTRAVENOUS

## 2016-03-08 MED ORDER — ROCURONIUM BROMIDE 50 MG/5ML IV SOLN
INTRAVENOUS | Status: AC
Start: 2016-03-08 — End: 2016-03-08
  Filled 2016-03-08: qty 1

## 2016-03-08 MED ORDER — HYDROMORPHONE HCL 1 MG/ML IJ SOLN
1.0000 mg | INTRAMUSCULAR | Status: DC | PRN
  Administered 2016-03-08: 1 mg via INTRAVENOUS
  Filled 2016-03-08 (×2): qty 1

## 2016-03-08 MED ORDER — FENTANYL CITRATE (PF) 100 MCG/2ML IJ SOLN
INTRAMUSCULAR | Status: DC | PRN
  Administered 2016-03-08: 50 ug via INTRAVENOUS
  Administered 2016-03-08: 25 ug via INTRAVENOUS
  Administered 2016-03-08 (×2): 50 ug via INTRAVENOUS
  Administered 2016-03-08: 100 ug via INTRAVENOUS
  Administered 2016-03-08: 50 ug via INTRAVENOUS
  Administered 2016-03-08: 25 ug via INTRAVENOUS

## 2016-03-08 MED ORDER — DIPHENHYDRAMINE HCL 50 MG/ML IJ SOLN
25.0000 mg | Freq: Four times a day (QID) | INTRAMUSCULAR | Status: DC | PRN
Start: 2016-03-08 — End: 2016-03-19

## 2016-03-08 MED ORDER — SUGAMMADEX SODIUM 200 MG/2ML IV SOLN
INTRAVENOUS | Status: DC | PRN
  Administered 2016-03-08: 250 mg via INTRAVENOUS

## 2016-03-08 MED ORDER — ONDANSETRON HCL 4 MG PO TABS
4.0000 mg | ORAL_TABLET | Freq: Four times a day (QID) | ORAL | Status: DC | PRN
  Administered 2016-03-10 – 2016-03-13 (×3): 4 mg via ORAL
  Filled 2016-03-08: qty 2
  Filled 2016-03-08 (×2): qty 1

## 2016-03-08 MED ORDER — OXYCODONE HCL 5 MG PO TABS
5.0000 mg | ORAL_TABLET | Freq: Four times a day (QID) | ORAL | Status: DC | PRN
  Administered 2016-03-08 – 2016-03-09 (×3): 5 mg via ORAL
  Administered 2016-03-09 – 2016-03-10 (×4): 10 mg via ORAL
  Administered 2016-03-10 (×2): 5 mg via ORAL
  Administered 2016-03-11 – 2016-03-19 (×14): 10 mg via ORAL
  Filled 2016-03-08 (×5): qty 2
  Filled 2016-03-08: qty 1
  Filled 2016-03-08: qty 2
  Filled 2016-03-08: qty 1
  Filled 2016-03-08 (×4): qty 2
  Filled 2016-03-08 (×2): qty 1
  Filled 2016-03-08 (×3): qty 2
  Filled 2016-03-08: qty 1
  Filled 2016-03-08 (×5): qty 2

## 2016-03-08 MED ORDER — PROPOFOL 10 MG/ML IV BOLUS
INTRAVENOUS | Status: DC | PRN
  Administered 2016-03-08: 150 mg via INTRAVENOUS
  Administered 2016-03-08: 10 mg via INTRAVENOUS
  Administered 2016-03-08: 40 mg via INTRAVENOUS

## 2016-03-08 MED ORDER — FLEET ENEMA 7-19 GM/118ML RE ENEM: 1.0000 | ENEMA | Freq: Once | RECTAL | Status: DC

## 2016-03-08 MED ORDER — ACETAMINOPHEN 10 MG/ML IV SOLN
INTRAVENOUS | Status: AC
Start: 2016-03-08 — End: 2016-03-08
  Filled 2016-03-08: qty 100

## 2016-03-08 MED ORDER — DIPHENHYDRAMINE HCL 25 MG PO CAPS
25.0000 mg | ORAL_CAPSULE | Freq: Four times a day (QID) | ORAL | Status: DC | PRN
  Administered 2016-03-09: 25 mg via ORAL
  Filled 2016-03-08: qty 1

## 2016-03-08 MED ORDER — SUGAMMADEX SODIUM 500 MG/5ML IV SOLN
INTRAVENOUS | Status: AC
  Filled 2016-03-08: qty 5

## 2016-03-08 MED ORDER — FENTANYL CITRATE (PF) 100 MCG/2ML IJ SOLN
25.0000 ug | INTRAMUSCULAR | Status: AC | PRN
  Administered 2016-03-08 (×6): 25 ug via INTRAVENOUS

## 2016-03-08 MED ORDER — ACETAMINOPHEN 10 MG/ML IV SOLN
INTRAVENOUS | Status: DC | PRN
Start: 2016-03-08 — End: 2016-03-08
  Administered 2016-03-08: 1000 mg via INTRAVENOUS

## 2016-03-08 MED ORDER — EPHEDRINE SULFATE 50 MG/ML IJ SOLN
INTRAMUSCULAR | Status: DC | PRN
  Administered 2016-03-08: 7.5 mg via INTRAVENOUS

## 2016-03-08 MED ORDER — LACTATED RINGERS IV SOLN
INTRAVENOUS | Status: DC
  Administered 2016-03-08 – 2016-03-10 (×6): via INTRAVENOUS

## 2016-03-08 MED ORDER — FENTANYL CITRATE (PF) 100 MCG/2ML IJ SOLN
INTRAMUSCULAR | Status: AC
  Filled 2016-03-08: qty 2

## 2016-03-08 SURGICAL SUPPLY — 52 items
BLADE SURG 10 STRL SS SAFETY (BLADE) ×3 IMPLANT
CANISTER SUCT 1200ML W/VALVE (MISCELLANEOUS) ×2 IMPLANT
CATH FOL LEG HOLDER (MISCELLANEOUS) ×2 IMPLANT
CATH TRAY 16F METER LATEX (MISCELLANEOUS) ×2 IMPLANT
DRAIN PENROSE 1/4X12 LTX (DRAIN) ×1 IMPLANT
DRAPE LAPAROTOMY 100X77 ABD (DRAPES) ×2 IMPLANT
DRAPE LEGGINS SURG 28X43 STRL (DRAPES) ×2 IMPLANT
DRAPE TABLE BACK 80X90 (DRAPES) ×2 IMPLANT
DRAPE UNDER BUTTOCK W/FLU (DRAPES) ×2 IMPLANT
DRSG OPSITE POSTOP 4X10 (GAUZE/BANDAGES/DRESSINGS) ×1 IMPLANT
DRSG OPSITE POSTOP 4X12 (GAUZE/BANDAGES/DRESSINGS) ×1 IMPLANT
DRSG OPSITE POSTOP 4X14 (GAUZE/BANDAGES/DRESSINGS) ×2 IMPLANT
DRSG OPSITE POSTOP 4X6 (GAUZE/BANDAGES/DRESSINGS) ×2 IMPLANT
ELECT BLADE 6.5 EXT (BLADE) ×2 IMPLANT
ELECT CAUTERY BLADE 6.4 (BLADE) ×2 IMPLANT
ELECT REM PT RETURN 9FT ADLT (ELECTROSURGICAL) ×2
ELECTRODE REM PT RTRN 9FT ADLT (ELECTROSURGICAL) ×1 IMPLANT
GAUZE SPONGE 4X4 12PLY STRL (GAUZE/BANDAGES/DRESSINGS) ×1 IMPLANT
GLOVE BIO SURGEON STRL SZ7.5 (GLOVE) ×9 IMPLANT
GLOVE INDICATOR 8.0 STRL GRN (GLOVE) ×9 IMPLANT
GOWN STRL REUS W/ TWL LRG LVL3 (GOWN DISPOSABLE) ×4 IMPLANT
GOWN STRL REUS W/ TWL XL LVL3 (GOWN DISPOSABLE) ×2 IMPLANT
GOWN STRL REUS W/TWL LRG LVL3 (GOWN DISPOSABLE) ×8
GOWN STRL REUS W/TWL XL LVL3 (GOWN DISPOSABLE) ×4
LABEL OR SOLS (LABEL) ×2 IMPLANT
LIGASURE MARYLAND LAP STAND (ELECTROSURGICAL) ×2 IMPLANT
LOOP OSTOMY BRIDGE (OSTOMY) ×1 IMPLANT
NS IRRIG 1000ML POUR BTL (IV SOLUTION) ×2 IMPLANT
PACK BASIN MAJOR ARMC (MISCELLANEOUS) ×2 IMPLANT
PACK COLON CLEAN CLOSURE (MISCELLANEOUS) ×2 IMPLANT
RELOAD LINEAR CUT PROX 55 BLUE (ENDOMECHANICALS) ×4 IMPLANT
RELOAD STAPLE 55 3.8 BLU REG (ENDOMECHANICALS) IMPLANT
RETAINER VISCERA MED (MISCELLANEOUS) ×2 IMPLANT
SOL PREP PVP 2OZ (MISCELLANEOUS) ×2
SOLUTION PREP PVP 2OZ (MISCELLANEOUS) ×1 IMPLANT
SPONGE LAP 18X18 5 PK (GAUZE/BANDAGES/DRESSINGS) ×4 IMPLANT
STAPLER PROXIMATE 55 BLUE (STAPLE) ×1 IMPLANT
STAPLER SKIN PROX 35W (STAPLE) ×2 IMPLANT
SUT ETHILON 3-0 KS 30 BLK (SUTURE) ×1 IMPLANT
SUT NYLON 2-0 (SUTURE) ×2 IMPLANT
SUT PDS AB 1 TP1 96 (SUTURE) ×4 IMPLANT
SUT PROLENE 0 CT 1 CR/8 (SUTURE) ×2 IMPLANT
SUT SILK 2 0 SH (SUTURE) ×3 IMPLANT
SUT SILK 2 0SH CR/8 30 (SUTURE) ×2 IMPLANT
SUT SILK 3-0 (SUTURE) ×2 IMPLANT
SUT VIC AB 1 CTX 27 (SUTURE) ×6 IMPLANT
SUT VIC AB 2-0 SH 27 (SUTURE) ×2
SUT VIC AB 2-0 SH 27XBRD (SUTURE) IMPLANT
SUT VIC AB 3-0 SH 27 (SUTURE) ×4
SUT VIC AB 3-0 SH 27X BRD (SUTURE) ×2 IMPLANT
SUT VICRYL PLUS ABS 0 54 (SUTURE) ×2 IMPLANT
SYR BULB IRRIG 60ML STRL (SYRINGE) ×2 IMPLANT

## 2016-03-08 NOTE — Brief Op Note (Signed)
03/08/2016  11:47 AM  PATIENT:  Barbara Hawkins  41 y.o. female  PRE-OPERATIVE DIAGNOSIS:  colostomy in place  POST-OPERATIVE DIAGNOSIS:  colostomy in place  PROCEDURE:  Procedure(s): COLOSTOMY REVERSAL (N/A) LOOP ILEOSTOMY CREATION (N/A)  SURGEON:  Surgeon(s) and Role:    * Ricarda Frameharles Juvencio Verdi, MD - Primary    * Diego Ronnette JuniperF Pabon, MD - Assisting  PHYSICIAN ASSISTANT:   ASSISTANTS: PA Student   ANESTHESIA:   general  EBL:  Total I/O In: 2000 [I.V.:2000] Out: 400 [Urine:300; Blood:100]  BLOOD ADMINISTERED:none  DRAINS: Penrose drain in the prior ostomy and mucous fistula site   LOCAL MEDICATIONS USED:  NONE  SPECIMEN:  Source of Specimen:  prior ostomy and mucous fistula  DISPOSITION OF SPECIMEN:  PATHOLOGY  COUNTS:  YES  TOURNIQUET:  * No tourniquets in log *  DICTATION: .Dragon Dictation  PLAN OF CARE: Admit to inpatient   PATIENT DISPOSITION:  PACU - hemodynamically stable.   Delay start of Pharmacological VTE agent (>24hrs) due to surgical blood loss or risk of bleeding: no

## 2016-03-08 NOTE — Progress Notes (Signed)
Pt with c/o severe pain post op. She states that last admission she had a PCA and after that was on percocet and Toradol. Per MD we will order percocet PRN and hold off on toradol for now.

## 2016-03-08 NOTE — Interval H&P Note (Signed)
History and Physical Interval Note:  03/08/2016 7:15 AM  Barbara CoyerAmanda P Francom  has presented today for surgery, with the diagnosis of colostomy in place  The various methods of treatment have been discussed with the patient and family. After consideration of risks, benefits and other options for treatment, the patient has consented to  Procedure(s): COLOSTOMY REVERSAL (N/A) LOOP ILEOSTOMY CREATION (N/A) as a surgical intervention .  The patient's history has been reviewed, patient examined, no change in status, stable for surgery.  I have reviewed the patient's chart and labs.  Questions were answered to the patient's satisfaction.     [name]
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
## 2016-03-08 NOTE — Anesthesia Procedure Notes (Signed)
Procedure Name: Intubation Date/Time: 03/08/2016 7:39 AM Performed by: Allean Found Pre-anesthesia Checklist: Patient identified, Emergency Drugs available, Suction available, Patient being monitored and Timeout performed Patient Re-evaluated:Patient Re-evaluated prior to inductionOxygen Delivery Method: Circle system utilized Preoxygenation: Pre-oxygenation with 100% oxygen Intubation Type: IV induction Ventilation: Mask ventilation without difficulty Laryngoscope Size: Mac and 3 Grade View: Grade I Tube type: Oral Tube size: 7.0 mm Number of attempts: 1 Airway Equipment and Method: Stylet Placement Confirmation: ETT inserted through vocal cords under direct vision,  positive ETCO2 and breath sounds checked- equal and bilateral Secured at: 21 cm Tube secured with: Tape Dental Injury: Teeth and Oropharynx as per pre-operative assessment

## 2016-03-08 NOTE — Anesthesia Post-op Follow-up Note (Cosign Needed)
Anesthesia QCDR form completed.        

## 2016-03-08 NOTE — Progress Notes (Signed)
Attempted to do IS with patient but she did not wish to do at this time due to excessive pain post operatively. Is left at bedside and patient instructed on how to use and reasoning behind Is. Josh RN notified

## 2016-03-08 NOTE — Op Note (Signed)
Pre-operative Diagnosis: History of colostomy  Post-operative Diagnosis: Same  Procedure Performed: Exploratory laparotomy with takedown of prior colostomy, mucous fistula, handsewn anastomosis. Creation of loop ileostomy. Incidental appendectomy.  Surgeon: Ricarda Frame   Assistants: Dr. Sterling Big  Anesthesia: General endotracheal anesthesia  ASA Class: 3  Surgeon: Ricarda Frame, MD FACS  Anesthesia: Gen. with endotracheal tube  Assistant:PA Student  Procedure Details  The patient was seen again in the Holding Room. The benefits, complications, treatment options, and expected outcomes were discussed with the patient. The risks of bleeding, infection, recurrence of symptoms, failure to resolve symptoms,  bowel injury, any of which could require further surgery were reviewed with the patient.   The patient was taken to Operating Room, identified as Barbara Hawkins and the procedure verified.  A Time Out was held and the above information confirmed.  Prior to the induction of general anesthesia, antibiotic prophylaxis was administered. VTE prophylaxis was in place. General endotracheal anesthesia was then administered and tolerated well. After the induction, the abdomen was prepped with Chloraprep and draped in the sterile fashion. The patient was positioned in the supine position.  The prior midline incision was incised with a 10 blade scalpel. Using Bovie much cautery was taken down below the fascia. The fascia was bluntly entered into and was protected while it was opened up its length with electrocautery. The omentum was adhered to the midline which was taken down with blunt dissection and left cautery. There is no evidence of damage to the deeper structures upon entering the abdomen. Starting with mucous fistula the anterior abdominal wall was freed off laterally to her left upper quadrant until the mucus fistula opening was identified. An excision was then sharply dissected away  from the skin and with Bovie much cautery was taken down circumferentially until was freed around the fascia and able to be delivered into the peritoneal cavity. The distal end of the mucous fistula was excised with a 55 mm blue load GIA stapler and passed off the field.  Next turned our attention to the end colostomy. Using the same technique as the mucous fistula the colostomy was circumferentially freed from the skin and deeper tissues down to fascia using accommodation of sharp and electrocautery dissection. This again was taken down to the level of fascia until it could be delivered into the peritoneal cavity where the distal aspect was also excised with a GIA stapler and passed off the field.  After freeing both ends of the colon it was noted that there was actually adequate length to perform an anastomosis without any further mobilization either proximally or distally. Both ends were freed from fatty attachments and laid nicely adjacent to each other. At this point the decision was made to perform a 2 layer handsewn anastomosis. Using a running 2-0 Vicryl suture the mucosal edges were able to be reapproximated circumferentially. The next layer was with 2-0 silk suture in a Lembert fashion reapproximating the remainder of the bowel. The anastomosis was patulous at the completion of the anastomosis. After this a tongue of omentum was placed over and around the anastomosis and secured with 2-0 silk suture. The remainder of the abdomen was explored and any areas of adhesions were freed up both sharply and with electrocautery. There was evidence of a prior abscess from her previous disease that had grabbed the small bowel. This was freed up with no evidence of small bowel enterotomy or injury was identified.  We then turned our attention to the distal  ileum and the appendix was found to be almost incorporated with the previous inflammation. An incidental appendectomy was then performed. The base of the  appendix was dissected free and using another fire of the staple load was taken at the cecal base. The mesial appendix was taken down using electrocautery and a suture ligation of the mesoappendix was performed for hemostasis.  The previously marked ileostomy site was then sharply created with a 10 blade scalpel and using blunt dissection and Bovie much cautery was taken down to the level of the fascia. The fascia was then entered into with protective electrocautery and manually dilated up to accept 2 fingerbreadths. The chosen area of ileum was noted to have adequate mobilization and was delivered through the new ostomy site and secured with a loop ileostomy bridge.  At this point the entire abdomen was copiously irrigated. Any areas of noticeable bleeding was made hemostatic with directed electrocautery. There is no evidence of bowel injury or continued bleeding at the end of the procedure. We then decided to perform a clean closure technique. Barbara Hawkins the operative team re-scrubbed into sterile gowns and gloves. The patient was redraped with sterile drapes in all new sterile equipment was brought up for the closure. The midline fascial defect was closed using a combination of a running #1 looped PDS with interrupted 0 Prolene figure-of-eight sutures as well. The prior ostomy site and mucous fistula sites were closed with a running #1 PDS suture. The prior osteocytes were then closed the skin with a surgical stapler with a quarter inch Penrose drain within each site. The midline was then closed with a surgical staple line. Sterile dressings were placed over all 3 of these sites prior to the maturation of the ileostomy. Her graft with the ileostomy bridge in place a transverse incision was made into the ileum and the double barrel loop ileostomy was created with interrupted silk sutures until the 2 lm were clearly identified. An ostomy appliance was then placed over the loop ileostomy and secured.  Patient  tolerated the procedure well. She was awoken from general endotracheal anesthesia and transferred to the PACU in good condition. All counts were correct at the end of the procedure and there are no immediate, complications.  Findings: History of colostomy   Estimated Blood Loss: 100 mL         Drains: Penrose drains in the prior ostomy and mucous fistula sites         Specimens: Colostomy, mucous fistula, incidental appendectomy          Complications: None                  Condition: Good   Ricarda Frameharles Rommie Dunn, MD, FACS
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
## 2016-03-08 NOTE — Progress Notes (Signed)
Will provide work note to daughter for her mothers hospitalization. Ok with pt to have her info on this note.

## 2016-03-08 NOTE — Transfer of Care (Signed)
Immediate Anesthesia Transfer of Care Note  Patient: Barbara Hawkins  Procedure(s) Performed: Procedure(s): COLOSTOMY REVERSAL (N/A) LOOP ILEOSTOMY CREATION (N/A)  Patient Location: PACU  Anesthesia Type:General  Level of Consciousness: awake  Airway & Oxygen Therapy: Patient Spontanous Breathing and Patient connected to face mask oxygen  Post-op Assessment: Report given to RN and Post -op Vital signs reviewed and stable  Post vital signs: Reviewed and stable  Last Vitals:  Vitals:   03/08/16 0619 03/08/16 1155  BP: (!) 137/95 110/74  Pulse: (!) 103 89  Resp: 15 (!) 25  Temp: 36.8 C 36.7 C    Last Pain:  Vitals:   03/08/16 1155  TempSrc: Tympanic         Complications: No apparent anesthesia complications

## 2016-03-09 ENCOUNTER — Encounter: Payer: Self-pay | Admitting: General Surgery

## 2016-03-09 LAB — SURGICAL PATHOLOGY

## 2016-03-09 LAB — BASIC METABOLIC PANEL
Anion gap: 8 (ref 5–15)
BUN: 8 mg/dL (ref 6–20)
CALCIUM: 8.3 mg/dL — AB (ref 8.9–10.3)
CO2: 25 mmol/L (ref 22–32)
Chloride: 105 mmol/L (ref 101–111)
Creatinine, Ser: 0.64 mg/dL (ref 0.44–1.00)
GFR calc Af Amer: >60 mL/min (ref >60)
GLUCOSE: 106 mg/dL — AB (ref 65–99)
POTASSIUM: 3.6 mmol/L (ref 3.5–5.1)
Sodium: 138 mmol/L (ref 135–145)

## 2016-03-09 LAB — CBC
HEMATOCRIT: 34.8 % — AB (ref 35.0–47.0)
Hemoglobin: 12 g/dL (ref 12.0–16.0)
MCH: 29.7 pg (ref 26.0–34.0)
MCHC: 34.4 g/dL (ref 32.0–36.0)
MCV: 86.4 fL (ref 80.0–100.0)
PLATELETS: 187 10*3/uL (ref 150–440)
RBC: 4.03 MIL/uL (ref 3.80–5.20)
RDW: 14.7 % — AB (ref 11.5–14.5)
WBC: 10.7 10*3/uL (ref 3.6–11.0)

## 2016-03-09 MED ORDER — KETOROLAC TROMETHAMINE 30 MG/ML IJ SOLN
30.0000 mg | Freq: Four times a day (QID) | INTRAMUSCULAR | Status: AC
  Administered 2016-03-09 – 2016-03-14 (×19): 30 mg via INTRAVENOUS
  Filled 2016-03-09 (×19): qty 1

## 2016-03-09 MED ORDER — PANTOPRAZOLE SODIUM 40 MG PO TBEC
40.0000 mg | DELAYED_RELEASE_TABLET | Freq: Every day | ORAL | Status: DC
  Administered 2016-03-09 – 2016-03-19 (×11): 40 mg via ORAL
  Filled 2016-03-09 (×11): qty 1

## 2016-03-09 MED ORDER — GABAPENTIN 400 MG PO CAPS
400.0000 mg | ORAL_CAPSULE | Freq: Three times a day (TID) | ORAL | Status: DC
  Administered 2016-03-09 – 2016-03-19 (×28): 400 mg via ORAL
  Filled 2016-03-09 (×30): qty 1

## 2016-03-09 MED ORDER — GABAPENTIN 800 MG PO TABS
400.0000 mg | ORAL_TABLET | Freq: Three times a day (TID) | ORAL | Status: DC
  Filled 2016-03-09 (×2): qty 0.5

## 2016-03-09 NOTE — Plan of Care (Signed)
Problem: Food- and Nutrition-Related Knowledge Deficit (NB-1.1) Goal: Nutrition education Formal process to instruct or train a patient/client in a skill or to impart knowledge to help patients/clients voluntarily manage or modify food choices and eating behavior to maintain or improve health.  Outcome: Completed/Met Date Met: 03/09/16 Nutrition Education Note  Patient identified to be seen per rounds.   RD provided "Ileostomy Nutrition Therapy" handout from the [employer]. Encouraged patient to keep fiber intake below 8 grams during transition from liquids to solids, and below 13 grams per day as symptoms subside. Encouraged patient to consume refined and white grain foods with less than 2g of fiber per serving as well as soft, well-cooked proteins, and well cooked vegetables without seeds or skin.  RD discussed why it is important to adhere to list of recommended foods, and foods to avoid, to maintain ostomy integrity and decrease risk of gas, diarrhea, and blockage related complications. Encouraged patient to consume 8 to 10 cups of liquid per day and to drink liquids 30 minutes after meals or snacks to prevent flushing.   Provided tips to ensure adequate absorption of medications, vitamins, and minerals. After 6 weeks, as patient's diet returns to normal, encouraged adding 1 new food in per day, for a few days to monitor tolerance.  Expect good compliance  Body mass index is 44.69 kg/m. Pt meets criteria for Obesity Class III based on current BMI.   Current diet order is Clear Liquids. Labs and medications reviewed. No further nutrition interventions warranted at this time. RD contact information provided. If additional nutrition issues arise, please consult RD  Willey Blade, MS, RD, LDN Pager: [phone] After Hours Pager: [phone]

## 2016-03-09 NOTE — Progress Notes (Signed)
POD # 1 Doing well Pain issues AVSS Good U/O Labs Ok  PE: NAD Abd: soft, midline dressing intact. Ileostomy pink patent. Some fluid in bad. No evidence of infection, penroses in place w some drainage. Ext: well perfused  A/P Doing well Add gabapentin, toradol, PPI DC foley Mobilize clears

## 2016-03-09 NOTE — Plan of Care (Signed)
Problem: Pain Managment: Goal: General experience of comfort will improve Outcome: Not Progressing Continues to require frequent pain meds.

## 2016-03-09 NOTE — Anesthesia Postprocedure Evaluation (Signed)
Anesthesia Post Note  Patient: Barbara Hawkins  Procedure(s) Performed: Procedure(s) (LRB): COLOSTOMY REVERSAL (N/A) LOOP ILEOSTOMY CREATION (N/A)  Patient location during evaluation: PACU Anesthesia Type: General Level of consciousness: awake and alert and oriented Pain management: pain level controlled Vital Signs Assessment: post-procedure vital signs reviewed and stable Respiratory status: spontaneous breathing Cardiovascular status: blood pressure returned to baseline Anesthetic complications: no     Last Vitals:  Vitals:   03/09/16 0445 03/09/16 0825  BP: 111/62 114/65  Pulse: 89 79  Resp: 17 16  Temp: 37.1 C 37.1 C    Last Pain:  Vitals:   03/09/16 0825  TempSrc: Oral  PainSc:                  [name]

## 2016-03-10 LAB — CBC
HCT: 29.6 % — ABNORMAL LOW (ref 35.0–47.0)
Hemoglobin: 10 g/dL — ABNORMAL LOW (ref 12.0–16.0)
MCH: 29 pg (ref 26.0–34.0)
MCHC: 33.7 g/dL (ref 32.0–36.0)
MCV: 85.8 fL (ref 80.0–100.0)
PLATELETS: 143 10*3/uL — AB (ref 150–440)
RBC: 3.45 MIL/uL — AB (ref 3.80–5.20)
RDW: 15 % — ABNORMAL HIGH (ref 11.5–14.5)
WBC: 8.4 10*3/uL (ref 3.6–11.0)

## 2016-03-10 LAB — BASIC METABOLIC PANEL
ANION GAP: 6 (ref 5–15)
BUN: 10 mg/dL (ref 6–20)
CALCIUM: 8 mg/dL — AB (ref 8.9–10.3)
CO2: 27 mmol/L (ref 22–32)
Chloride: 102 mmol/L (ref 101–111)
Creatinine, Ser: 0.77 mg/dL (ref 0.44–1.00)
GLUCOSE: 92 mg/dL (ref 65–99)
POTASSIUM: 3.5 mmol/L (ref 3.5–5.1)
SODIUM: 135 mmol/L (ref 135–145)

## 2016-03-10 NOTE — Progress Notes (Signed)
2 Days Post-Op   Subjective:  Patient seen and examined earlier this morning and the seeding. Doing very well. Tolerating her liquid diet. Pain controlled on oral medications currently. Having ostomy output without any nausea or vomiting.  Vital signs in last 24 hours: Temp:  [97.9 F (36.6 C)-98.7 F (37.1 C)] 98.7 F (37.1 C) (02/16 2059) Pulse Rate:  [84-94] 94 (02/16 2059) Resp:  [17-20] 20 (02/16 2059) BP: (103-122)/(57-69) 103/57 (02/16 2059) SpO2:  [98 %-99 %] 98 % (02/16 2059) Last BM Date:  (ostomy)  Intake/Output from previous day: 02/15 0701 - 02/16 0700 In: 2231 [I.V.:2231] Out: 1405 [Urine:1375; Stool:30]  GI: Abdomen is soft, appropriately tender to palpation at her incision sites, nondistended. Midline staple line and right and left upper quadrant staple lines intact without erythema or purulent drainage. Some serosanguinous fluid noted draining from all sites on dressing but not actively on inspection. Right lower quadrant loop ileostomy in place and functioning properly.  Lab Results:  CBC  Recent Labs  03/09/16 0458 03/10/16 0504  WBC 10.7 8.4  HGB 12.0 10.0*  HCT 34.8* 29.6*  PLT 187 143*   CMP     Component Value Date/Time   NA 135 03/10/2016 0504   NA 140 01/30/2014 1513   K 3.5 03/10/2016 0504   K 4.2 01/30/2014 1513   CL 102 03/10/2016 0504   CL 107 01/30/2014 1513   CO2 27 03/10/2016 0504   CO2 27 01/30/2014 1513   GLUCOSE 92 03/10/2016 0504   GLUCOSE 98 01/30/2014 1513   BUN 10 03/10/2016 0504   BUN 5 (L) 01/30/2014 1513   CREATININE 0.77 03/10/2016 0504   CREATININE 0.77 01/30/2014 1513   CALCIUM 8.0 (L) 03/10/2016 0504   CALCIUM 8.9 01/30/2014 1513   PROT 7.8 03/01/2016 1109   PROT 7.3 01/30/2014 1513   ALBUMIN 4.1 03/01/2016 1109   ALBUMIN 3.6 01/30/2014 1513   AST 13 (L) 03/01/2016 1109   AST 24 01/30/2014 1513   ALT 11 (L) 03/01/2016 1109   ALT 28 01/30/2014 1513   ALKPHOS 84 03/01/2016 1109   ALKPHOS 96 01/30/2014 1513   BILITOT 0.4 03/01/2016 1109   BILITOT 0.5 01/30/2014 1513   GFRNONAA >60 03/10/2016 0504   GFRNONAA >60 01/30/2014 1513   GFRNONAA >60 02/27/2013 1736   GFRAA >60 03/10/2016 0504   GFRAA >60 01/30/2014 1513   GFRAA >60 02/27/2013 1736   PT/INR No results for input(s): LABPROT, INR in the last 72 hours.  Studies/Results: No results found.  Assessment/Plan: 41 year old female 2 days status post ostomy reversal with creation of loop ileostomy. Doing very well. Plan to saline lock IV after completion current bag of fluids. Advance diet to regular for breakfast. Encourage ambulation and incentive spirometer usage.   Ricarda Frameharles Kam Kushnir, MD [facility] General Surgeon [facility]  Day [facility] [phone]) [phone] Night [facility] [phone]  03/10/2016

## 2016-03-10 NOTE — Care Management (Signed)
Patient admitted post Exploratory laparotomy with takedown of prior colostomy, mucous fistula, handsewn anastomosis. Creation of loop ileostomy. Incidental appendectomy.  WOC consult placed.  [facility] has been [facility] with [facility] for [facility] services, however she now has [employer] coverage.  RNCM following

## 2016-03-10 NOTE — Progress Notes (Signed)
Dr. Tonita CongWoodham saw the pt today She is doing well Advance diet Mobilize Doing well

## 2016-03-10 NOTE — Progress Notes (Addendum)
Surgeon on call was notified of pt.'s honeycomb dressing being saturated with dark red blood, however blood is not leaking out of dressing. Pt is resting comfortably, VSS. New orders to place abdominal binder on pt.   Barbara Hawkins

## 2016-03-11 NOTE — Progress Notes (Signed)
POD # 3 AVSS Continue to improve no complaints Tolerating fulls Ostomy working  PE NAD Abd: soft, Incisions c/d/i, penrose in place. Ostomy pink and patent. Good output  A/P Doing well Mobilize Labs in am Regular diet

## 2016-03-11 NOTE — Progress Notes (Signed)
Called by RN , bridge  dislodged while she was in the bathroom PT seen and examined. Abdomen soft, NT. I removed the colostomy bag and examined the ileostomy. The bridge cut through the posterior wall of the SB. Both ends are still patent and not retracted, there is no bleeding or necrosis We will continue to observe as long as the ileostomy is working no surgical intervention required

## 2016-03-12 LAB — BASIC METABOLIC PANEL
Anion gap: 8 (ref 5–15)
BUN: 6 mg/dL (ref 6–20)
CHLORIDE: 104 mmol/L (ref 101–111)
CO2: 27 mmol/L (ref 22–32)
Calcium: 8.1 mg/dL — ABNORMAL LOW (ref 8.9–10.3)
Creatinine, Ser: 0.62 mg/dL (ref 0.44–1.00)
GFR calc Af Amer: >60 mL/min (ref >60)
GFR calc non Af Amer: >60 mL/min (ref >60)
GLUCOSE: 95 mg/dL (ref 65–99)
POTASSIUM: 3.3 mmol/L — AB (ref 3.5–5.1)
Sodium: 139 mmol/L (ref 135–145)

## 2016-03-12 LAB — MAGNESIUM: MAGNESIUM: 1.8 mg/dL (ref 1.7–2.4)

## 2016-03-12 MED ORDER — PANTOPRAZOLE SODIUM 40 MG PO TBEC: 40.0000 mg | DELAYED_RELEASE_TABLET | Freq: Every day | ORAL | 0 refills | Status: DC

## 2016-03-12 MED ORDER — OXYCODONE-ACETAMINOPHEN 5-325 MG PO TABS: 1.0000 | ORAL_TABLET | Freq: Four times a day (QID) | ORAL | 0 refills | Status: DC | PRN

## 2016-03-12 MED ORDER — OXYCODONE HCL 5 MG PO TABS: 5.0000 mg | ORAL_TABLET | Freq: Four times a day (QID) | ORAL | 0 refills | Status: DC | PRN

## 2016-03-12 MED ORDER — POTASSIUM CHLORIDE CRYS ER 20 MEQ PO TBCR
20.0000 meq | EXTENDED_RELEASE_TABLET | Freq: Two times a day (BID) | ORAL | Status: AC
  Administered 2016-03-12 (×2): 20 meq via ORAL
  Filled 2016-03-12 (×2): qty 1

## 2016-03-12 NOTE — Progress Notes (Signed)
POD # 4 Bridge cut through the post wall of bowel. Loop Ileostomy patent VSS  PE NAD Abd: soft, incision c.d/i. Penrose in place, no infection  A/P Doing well DC in am

## 2016-03-12 NOTE — Progress Notes (Signed)
Dressing to abd changed, colostomy wafer changed, penrose drains x 2 intact draining serosang liquid, pt tol well.

## 2016-03-12 NOTE — Discharge Summary (Addendum)
Patient ID: Barbara Hawkins MRN: [id number] DOB/AGE: 41-Jul-1977 41 y.o.  Admit date: 03/08/2016 Discharge date: 03/19/2016  Discharge Diagnoses:  History of colostomy  Procedures Performed: Colostomy reversal with creation of loop ileostomy, incidental appendectomy  Discharged Condition: good  Hospital Course: Patient brought into the hospital for a planned colostomy reversal. Tolerated surgery well. Her discharge was delayed due to a post op ileus that resolved prior to discharge. On day of discharge she was tolerating a regular diet, ambulating without assistance and pain well controlled on oral medications.  Discharge Orders: Discharge Home, Continue daily and as needed dressing changes.   Disposition: 01-Home or Self Care  Discharge Medications: Allergies as of 03/12/2016      Reactions   Morphine Hives, Itching, Swelling   Septra [sulfamethoxazole-trimethoprim] Hives, Itching, Swelling   Tape Itching, Other (See Comments)   "redness, and skin tears very easily," Paper Tape is okay"      Medication List    TAKE these medications   acetaminophen 500 MG tablet Commonly known as:  TYLENOL Take 1,000 mg by mouth every 6 (six) hours as needed for mild pain or moderate pain.   dexlansoprazole 60 MG capsule Commonly known as:  DEXILANT Take 1 capsule (60 mg total) by mouth daily. What changed:  when to take this   ondansetron 4 MG disintegrating tablet Commonly known as:  ZOFRAN-ODT Take 1 tablet (4 mg total) by mouth every 8 (eight) hours as needed for nausea or vomiting.   oxyCODONE 5 MG immediate release tablet Commonly known as:  Oxy IR/ROXICODONE Take 1-2 tablets (5-10 mg total) by mouth every 6 (six) hours as needed for moderate pain or severe pain (take with percocet 5-325, 1-2 tabs q 6 hours prn).   oxyCODONE-acetaminophen 5-325 MG tablet Commonly known as:  PERCOCET/ROXICET Take 1-2 tablets by mouth every 6 (six) hours as needed for moderate pain or severe  pain.   pantoprazole 40 MG tablet Commonly known as:  PROTONIX Take 1 tablet (40 mg total) by mouth daily. Start taking on:  03/13/2016        Follwup: Follow-up Information    Ricarda Frameharles Dannica Bickham, MD. Go in 1 week(s).   Specialty:  General Surgery Why:  Report to clinic at 3pm on 2/26 for follow up Contact information: [address] [place] [address] [phone]           Signed: Ricarda FrameCharles Careena Degraffenreid 03/12/2016, 8:03 PM

## 2016-03-12 NOTE — Discharge Instructions (Signed)
Ileostomy Surgery, Care After  Refer to this sheet in the next few weeks. These instructions provide you with information on caring for yourself after your procedure. Your caregiver may also give you more specific instructions. Your treatment has been planned according to current medical practices, but problems sometimes occur. Call your caregiver if you have any problems or questions after your procedure.  HOME CARE INSTRUCTIONS   Rest. Give yourself time to adjust to having the external pouch if you have one. Give your surgical cuts (incisions) and new pouch time to heal.   Avoid strenuous activity, heavy lifting, and abdominal exercises for up to 10 weeks. After that, check with your caregiver.   Take pain medicine or other medicines as directed. Your caregiver may recommend certain medicines for the relief of constipation or diarrhea.   You may shower with or without the pouch.   Wear any clothing that is comfortable.   Follow your caregiver's instructions to protect the skin around the stoma.   Change your pouch as often as needed. It may need to be changed more often right after surgery.   Follow your caregiver's dietary instructions. Your caregiver will help you understand which foods may cause blockage, increase bowel movements, slow bowel movements, cause skin irritation, or cause gas.   You may gradually resume most activities, including sexual activity, as directed. Ask your caregiver about becoming pregnant and about using birth control. Medicines may not be absorbed normally after your procedure.   Always discuss your medicines with your caregiver before taking them.   You may travel. Be sure to pack plenty of ileostomy supplies.   If you smoke, quit. Smoking slows the healing process.  SEEK MEDICAL CARE IF:   You are having trouble caring for your stoma or using the ostomy supplies (changing the pouch).   You feel nauseous.   You vomit.   You notice bleeding, skin irritation, drainage,  bulging, redness of the wounds, or pain around the anus or stoma.   You notice a change in the size or appearance of the stoma.   You have abdominal pain, bloating, pressure, or cramping.   Your stools do not become firmer.   Your stool frequency is more or less often than expected.   You experience sexual dysfunction.   You experience shortness of breath, fatigue, thirst, dry mouth, or unusual sensations in the limbs.   You have other new symptoms.   You have questions or concerns.  SEEK IMMEDIATE MEDICAL CARE IF:   Your abdominal pain does not go away or it becomes severe.   You feel dizzy, lightheaded, or faint.   You measure pouch drainage of more than 1,500 ml per day. This amount of drainage can lead to dehydration.   You have a fever.   You keep vomiting.   Your stool is not draining through the stoma.   You have an irregular heartbeat or chest pain.  MAKE SURE YOU:   Understand these instructions.   Will watch your condition.   Will get help right away if you are not doing well or get worse.     This information is not intended to replace advice given to you by your health care provider. Make sure you discuss any questions you have with your health care provider.     Document Released: 08/22/2010 Document Revised: 01/14/2013 Document Reviewed: 10/30/2014  Elsevier Interactive Patient Education 2017 [employer] Inc[employer]

## 2016-03-13 MED ORDER — PROMETHAZINE HCL 25 MG/ML IJ SOLN
12.5000 mg | Freq: Four times a day (QID) | INTRAMUSCULAR | Status: DC | PRN
  Administered 2016-03-14 – 2016-03-15 (×6): 12.5 mg via INTRAVENOUS
  Filled 2016-03-13 (×6): qty 1

## 2016-03-13 MED ORDER — LACTATED RINGERS IV SOLN
INTRAVENOUS | Status: DC
  Administered 2016-03-14: via INTRAVENOUS

## 2016-03-13 NOTE — Progress Notes (Signed)
Dr. Aleen CampiPiscoya notified of patient's condition: "I feel like I'm going to explode...there is terrible pressure around my ostomy...something is wrong..If I have a fever, something's wrong, because I don't even run 'normal'; Patient had another episode of vomiting a large amount of yellow-brown fluid; ostomy with minimal output and bowel sounds are absent in all quadrants at this time. Dr. Aleen CampiPiscoya to round on patient this evening. Windy Carinaurner,Jadasia Haws K, RN 10:49 PM 03/13/2016

## 2016-03-13 NOTE — Consult Note (Addendum)
WOC Nurse ostomy consult note Stoma type/location: RLQ Loop ileostomy  Retention rod became dislodged yesterday when it came through the posterior wall of the small bowel.  Patient is tearful and anxious this AM.  She was very pleased with ostomy placement and hopeful that she would have an improved wear time with her ileostomy.  Emotional support provided and assurance that we would fit her into the best possible pouch.   Stomal assessment/size: Stoma is 1 1/4" wide and 1" long.  Two stomas with noted trauma where rod came loose.  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  The stoma is functioning.  Os is at 6 o'clock on right stoma.  Patient shown and reinforced to ensure that barrier ring is present here as peristomal skin breakdown will be likely here if skin is not protected.   Peristomal assessment: Red, irritated, intact.   Treatment options for stomal/peristomal skin: stoma powder, no sting skin prep and barrier ring.  Barrier cut to fit in oval shape and pattern sent home with patient.  She understands to measure at each pouch change.  Output loose green stool Ostomy pouching: 2pc. 2 1/4" pouch.  Needs rigidity of two piece for improved fit due to body habitus and flush stoma.   Education provided: Patient is very knowledgeable of self care.  Discussed difference in ileostomy vs colostomy.  Discussed blockage, peristomal skin care and benefit of wearing ostomy belt for increased support.  Will have a sample sent to her home.   Enrolled patient in [employer] DC program: Yes Will not follow at this time.  Please re-consult if needed.  Discharging today.  My card and contact info is provided as she is discharging today.  Maple HudsonKaren Shiro Ellerman RN BSN CWON Pager [phone]

## 2016-03-13 NOTE — Care Management (Signed)
Patient to discharge home today.  New PCP appointment arranged for Mayra ReelKate Clark NP.  Added to AVS.  Home health orders placed for RN.  Patient would like to use [facility].  Barbara CowerJason with [facility] notified of Referral RNCM signing off.

## 2016-03-13 NOTE — Progress Notes (Signed)
5 Days Post-Op  Subjective: Status post colostomy closure with diverting ileostomy. Patient has no complaints today except she did not have her breakfast yet and wants to eat a regular diet. She is set to go home today and awaiting a colostomy nurse visit  Objective: Vital signs in last 24 hours: Temp:  [97.4 F (36.3 C)-98.5 F (36.9 C)] 97.4 F (36.3 C) (02/19 0343) Pulse Rate:  [85-91] 85 (02/19 0343) Resp:  [17-18] 17 (02/19 0343) BP: (102-134)/(53-75) 102/53 (02/19 0343) SpO2:  [100 %] 100 % (02/19 0343) Last BM Date: 03/12/16  Intake/Output from previous day: 02/18 0701 - 02/19 0700 In: 360 [P.O.:360] Out: 1525 [Urine:1250; Stool:275] Intake/Output this shift: No intake/output data recorded.  Physical exam:  Morbidly obese no acute distress  Lab Results: CBC  No results for input(s): WBC, HGB, HCT, PLT in the last 72 hours. BMET  Recent Labs  03/12/16 0417  NA 139  K 3.3*  CL 104  CO2 27  GLUCOSE 95  BUN 6  CREATININE 0.62  CALCIUM 8.1*   PT/INR No results for input(s): LABPROT, INR in the last 72 hours. ABG No results for input(s): PHART, HCO3 in the last 72 hours.  Invalid input(s): PCO2, PO2  Studies/Results: No results found.  Anti-infectives: Anti-infectives    Start     Dose/Rate Route Frequency Ordered Stop   03/08/16 0331  cefoTEtan (CEFOTAN) 2 g in dextrose 5 % 50 mL IVPB     2 g 100 mL/hr over 30 Minutes Intravenous On call to O.R. 03/08/16 0331 03/08/16 0730      Assessment/Plan: s/p Procedure(s): COLOSTOMY REVERSAL LOOP ILEOSTOMY CREATION   Planning discharge today. Patient has no problems this morning except waiting for her diet as well as an ostomy nurse visit and she will be discharged. Dr. Tonita CongWoodham has performed all of the necessary paperwork etc. for discharge and patient will follow up with him per his instructions.  Lattie Hawichard E Alnita Aybar, MD, FACS  03/13/2016

## 2016-03-14 ENCOUNTER — Inpatient Hospital Stay: Payer: BLUE CROSS/BLUE SHIELD

## 2016-03-14 LAB — CBC WITH DIFFERENTIAL/PLATELET
Basophils Absolute: 0 10*3/uL (ref 0–0.1)
Basophils Relative: 0 %
EOS ABS: 0.1 10*3/uL (ref 0–0.7)
EOS PCT: 2 %
HCT: 32.4 % — ABNORMAL LOW (ref 35.0–47.0)
Hemoglobin: 11.4 g/dL — ABNORMAL LOW (ref 12.0–16.0)
LYMPHS ABS: 1.1 10*3/uL (ref 1.0–3.6)
Lymphocytes Relative: 19 %
MCH: 29.6 pg (ref 26.0–34.0)
MCHC: 35.1 g/dL (ref 32.0–36.0)
MCV: 84.4 fL (ref 80.0–100.0)
Monocytes Absolute: 0.8 10*3/uL (ref 0.2–0.9)
Monocytes Relative: 14 %
Neutro Abs: 3.7 10*3/uL (ref 1.4–6.5)
Neutrophils Relative %: 65 %
Platelets: 275 10*3/uL (ref 150–440)
RBC: 3.84 MIL/uL (ref 3.80–5.20)
RDW: 14.7 % — ABNORMAL HIGH (ref 11.5–14.5)
WBC: 5.7 10*3/uL (ref 3.6–11.0)

## 2016-03-14 LAB — BASIC METABOLIC PANEL
Anion gap: 7 (ref 5–15)
BUN: 11 mg/dL (ref 6–20)
CALCIUM: 8.4 mg/dL — AB (ref 8.9–10.3)
CO2: 31 mmol/L (ref 22–32)
Chloride: 100 mmol/L — ABNORMAL LOW (ref 101–111)
Creatinine, Ser: 0.82 mg/dL (ref 0.44–1.00)
GFR calc Af Amer: >60 mL/min (ref >60)
Glucose, Bld: 106 mg/dL — ABNORMAL HIGH (ref 65–99)
POTASSIUM: 4.3 mmol/L (ref 3.5–5.1)
SODIUM: 138 mmol/L (ref 135–145)

## 2016-03-14 LAB — MAGNESIUM: MAGNESIUM: 1.9 mg/dL (ref 1.7–2.4)

## 2016-03-14 MED ORDER — DEXTROSE IN LACTATED RINGERS 5 % IV SOLN
INTRAVENOUS | Status: DC
  Administered 2016-03-14 – 2016-03-18 (×9): via INTRAVENOUS

## 2016-03-14 NOTE — Progress Notes (Signed)
6 Days Post-Op  Subjective: Patient unable to be discharged yesterday due to a single episode of emesis. Patient did have another large volume emesis last night approximately 2300 hrs. She describes ongoing abdominal pain is an has been medicated for the abdominal pain. She's also been medicated for nausea this morning. Had little ileostomy output this morning.  Objective: Vital signs in last 24 hours: Temp:  [97.7 F (36.5 C)-99.1 F (37.3 C)] 99.1 F (37.3 C) (02/20 0502) Pulse Rate:  [90-102] 102 (02/20 0502) Resp:  [16-20] 20 (02/20 0502) BP: (107-121)/(54-67) 107/67 (02/20 0502) SpO2:  [89 %-100 %] 93 % (02/20 0502) Last BM Date: 03/13/16  Intake/Output from previous day: 02/19 0701 - 02/20 0700 In: 520 [P.O.:240; I.V.:280] Out: 2400 [Emesis/NG output:2400] Intake/Output this shift: No intake/output data recorded.  Physical exam:  Vital signs are stable MAXIMUM TEMPERATURE of 99 1 Morbidly obese female patient appears uncomfortable but in no acute distress.  Wound is clean with no drainage Penrose drain present in prior ostomy site. Abdomen is soft and fairly nontender no peritoneal signs Calves are nontender Lab Results: CBC   Recent Labs  03/14/16 0448  WBC 5.7  HGB 11.4*  HCT 32.4*  PLT 275   BMET  Recent Labs  03/12/16 0417 03/14/16 0448  NA 139 138  K 3.3* 4.3  CL 104 100*  CO2 27 31  GLUCOSE 95 106*  BUN 6 11  CREATININE 0.62 0.82  CALCIUM 8.1* 8.4*   PT/INR No results for input(s): LABPROT, INR in the last 72 hours. ABG No results for input(s): PHART, HCO3 in the last 72 hours.  Invalid input(s): PCO2, PO2  Studies/Results: No results found.  Anti-infectives: Anti-infectives    Start     Dose/Rate Route Frequency Ordered Stop   03/08/16 0331  cefoTEtan (CEFOTAN) 2 g in dextrose 5 % 50 mL IVPB     2 g 100 mL/hr over 30 Minutes Intravenous On call to O.R. 03/08/16 0331 03/08/16 0730      Assessment/Plan: s/p  Procedure(s): COLOSTOMY REVERSAL LOOP ILEOSTOMY CREATION   White blood cell count is not elevated. Abdominal films are personally reviewed. There is a possibly of gas in the area of the ileostomy on the right side there is some gas in the stomach and small bowel. No obvious obstruction. The official reading is currently pending. This will be reviewed.  Matting and abdominal pain she was ready to go home yesterday and then developed this nausea and vomited twice large volumes and had abdominal pain since yesterday. She has been medicated for nausea and pain. I will reexamine her later today and consider repeating films or even performing a CAT scan but her white blood cell count is normal and this would not suggest any sort of leak at this time.  Lattie Hawichard E Labrian Torregrossa, MD, FACS  03/14/2016

## 2016-03-14 NOTE — Progress Notes (Signed)
Visited again. Patient feels much better this afternoon she's had a large amount of succus out of her ileostomy. They have empty the bag multiple times. No further vomiting and her abdominal pain is improved.  Soft nontender but very obese abdomen with good ostomy output or calves  Much improved we'll continue IV fluids and clear liquids for now and likely advance diet tomorrow

## 2016-03-14 NOTE — Consult Note (Signed)
WOC Nurse ostomy follow up Stoma type/location: RLQ loop ileostomy, vomiting prior to discharge yesterday.   Patient is in bed asleep now.  Pouch is intact.  Will await further findings. Supplies at bedside.  [facility] at [facility].  WOC team will follow.   Maple HudsonKaren Vanita Cannell RN BSN CWON Pager [phone]

## 2016-03-14 NOTE — Progress Notes (Signed)
[facility], but the Pt was asleep. CH was not able to talk with the Pt, and will make a follow-up visit the Pt at an appropriate time.    03/14/16 1600  Clinical Encounter Type  Visited With Patient  Visit Type Initial  Referral From Nurse  Consult/Referral To Chaplain  Spiritual Encounters  Spiritual Needs Prayer;Other (Comment)

## 2016-03-15 ENCOUNTER — Inpatient Hospital Stay: Payer: BLUE CROSS/BLUE SHIELD

## 2016-03-15 LAB — CBC WITH DIFFERENTIAL/PLATELET
BASOS ABS: 0 10*3/uL (ref 0–0.1)
BASOS PCT: 0 %
Eosinophils Absolute: 0.2 10*3/uL (ref 0–0.7)
Eosinophils Relative: 3 %
HEMATOCRIT: 32.8 % — AB (ref 35.0–47.0)
HEMOGLOBIN: 11 g/dL — AB (ref 12.0–16.0)
Lymphocytes Relative: 19 %
Lymphs Abs: 0.9 10*3/uL — ABNORMAL LOW (ref 1.0–3.6)
MCH: 28.9 pg (ref 26.0–34.0)
MCHC: 33.6 g/dL (ref 32.0–36.0)
MCV: 86 fL (ref 80.0–100.0)
MONOS PCT: 17 %
Monocytes Absolute: 0.9 10*3/uL (ref 0.2–0.9)
NEUTROS ABS: 3 10*3/uL (ref 1.4–6.5)
NEUTROS PCT: 61 %
Platelets: 250 10*3/uL (ref 150–440)
RBC: 3.81 MIL/uL (ref 3.80–5.20)
RDW: 14.6 % — ABNORMAL HIGH (ref 11.5–14.5)
WBC: 5 10*3/uL (ref 3.6–11.0)

## 2016-03-15 LAB — COMPREHENSIVE METABOLIC PANEL
ALK PHOS: 185 U/L — AB (ref 38–126)
ALT: 51 U/L (ref 14–54)
AST: 39 U/L (ref 15–41)
Albumin: 2.6 g/dL — ABNORMAL LOW (ref 3.5–5.0)
Anion gap: 6 (ref 5–15)
BUN: 16 mg/dL (ref 6–20)
CALCIUM: 8.3 mg/dL — AB (ref 8.9–10.3)
CO2: 34 mmol/L — ABNORMAL HIGH (ref 22–32)
Chloride: 100 mmol/L — ABNORMAL LOW (ref 101–111)
Creatinine, Ser: 1.21 mg/dL — ABNORMAL HIGH (ref 0.44–1.00)
GFR calc Af Amer: >60 mL/min (ref >60)
GFR calc non Af Amer: 55 mL/min — ABNORMAL LOW (ref >60)
Glucose, Bld: 120 mg/dL — ABNORMAL HIGH (ref 65–99)
Potassium: 3.8 mmol/L (ref 3.5–5.1)
Sodium: 140 mmol/L (ref 135–145)
TOTAL PROTEIN: 6.1 g/dL — AB (ref 6.5–8.1)
Total Bilirubin: 0.6 mg/dL (ref 0.3–1.2)

## 2016-03-15 MED ORDER — ALUM & MAG HYDROXIDE-SIMETH 200-200-20 MG/5ML PO SUSP
30.0000 mL | ORAL | Status: DC | PRN
  Administered 2016-03-15: 30 mL via ORAL
  Filled 2016-03-15: qty 30

## 2016-03-15 NOTE — Progress Notes (Signed)
I had spoke with Dr Everlene FarrierPabon earlier this evening about the patient's nausea.  He said to place an NG tube if the patient vomited again.  Patient just vomited 300 ml.

## 2016-03-15 NOTE — Progress Notes (Signed)
Patient has been nauseated and vomited today.

## 2016-03-15 NOTE — Progress Notes (Signed)
POD # 7 s/p colostomy takedown w loop ileostomy PT w persistent intermittent pain Decrease PO intake Ostomy  2350cc VSS Creat increase to 1.2, nml wbc  PE NAD Abd: Obese, soft, incisions no infection and healing well. Ostomy functioning  A/p pt w persistent pain re check abd xrays, no evidence of bowel obstruction or need for surgical intervention at this time Continue IVF and keep her on clears for now

## 2016-03-16 LAB — CBC
HCT: 31 % — ABNORMAL LOW (ref 35.0–47.0)
HEMOGLOBIN: 10.6 g/dL — AB (ref 12.0–16.0)
MCH: 29.3 pg (ref 26.0–34.0)
MCHC: 34.3 g/dL (ref 32.0–36.0)
MCV: 85.5 fL (ref 80.0–100.0)
PLATELETS: 301 10*3/uL (ref 150–440)
RBC: 3.63 MIL/uL — AB (ref 3.80–5.20)
RDW: 15 % — ABNORMAL HIGH (ref 11.5–14.5)
WBC: 6.5 10*3/uL (ref 3.6–11.0)

## 2016-03-16 LAB — COMPREHENSIVE METABOLIC PANEL
ALBUMIN: 3 g/dL — AB (ref 3.5–5.0)
ALT: 38 U/L (ref 14–54)
AST: 22 U/L (ref 15–41)
Alkaline Phosphatase: 173 U/L — ABNORMAL HIGH (ref 38–126)
Anion gap: 9 (ref 5–15)
BUN: 14 mg/dL (ref 6–20)
CHLORIDE: 98 mmol/L — AB (ref 101–111)
CO2: 30 mmol/L (ref 22–32)
CREATININE: 1.02 mg/dL — AB (ref 0.44–1.00)
Calcium: 8.4 mg/dL — ABNORMAL LOW (ref 8.9–10.3)
GFR calc non Af Amer: >60 mL/min (ref >60)
GLUCOSE: 89 mg/dL (ref 65–99)
Potassium: 4.1 mmol/L (ref 3.5–5.1)
SODIUM: 137 mmol/L (ref 135–145)
Total Bilirubin: 0.6 mg/dL (ref 0.3–1.2)
Total Protein: 6.9 g/dL (ref 6.5–8.1)

## 2016-03-16 LAB — GLUCOSE, CAPILLARY: Glucose-Capillary: 99 mg/dL (ref 65–99)

## 2016-03-16 MED ORDER — MENTHOL 3 MG MT LOZG
1.0000 | LOZENGE | OROMUCOSAL | Status: DC | PRN
  Administered 2016-03-16: 3 mg via ORAL
  Filled 2016-03-16: qty 9

## 2016-03-16 MED ORDER — PHENOL 1.4 % MT LIQD
1.0000 | OROMUCOSAL | Status: DC | PRN
  Administered 2016-03-16: 1 via OROMUCOSAL
  Filled 2016-03-16: qty 177

## 2016-03-16 NOTE — Progress Notes (Signed)
Patient ID: Barbara Hawkins, female   DOB: Dec 31, 1975, 41 y.o.   MRN: [id number] Patient reports that she is feeling much better overall and has been up walking the hallway. She has passed some flatus. Ileostomy is functioning very well at this time. A low-grade temp of 100 this morning but otherwise her vital signs are relatively stable. The NG tube was then clamped for a couple hours now but she has had no nausea or vomiting. Abdomen is soft with good bowel sounds. Incision is intact and clean. The prior colostomy and mucous fistula sites also appeared to be clean. The drain in both these incisions were trimmed and tweaked. Lungs are clear No evidence of abdominal distention no suggestion of an ileus at this time.. Plan to clamp NG tube and resume suction on a when necessary basis. She has had no recurrence of nausea vomiting will plan on removing the NG tube tomorrow.

## 2016-03-16 NOTE — Progress Notes (Signed)
  Visited with patient. She reports continuing to feel better than earlier in the week. She is hungry and ready to have the tube removed from her nose. She understands that it will wait until tomorrow.  Abdomen is soft, appropriately tender at her incision sites. No evidence of infection and ostomy is functioning appropriately.  All questions answered to her satisfaction. Appreciate coverage by Dr. Karsten RoSankar  Barbara Marcella, MD [facility] General Surgeon [facility]  Day ASCOM [phone](7a-7p) [phone] Night ASCOM [phone](7p-7a) [phone]

## 2016-03-16 NOTE — Progress Notes (Signed)
Nutrition Follow-up  DOCUMENTATION CODES:   Not applicable  INTERVENTION:  -Await diet progression   NUTRITION DIAGNOSIS:   Inadequate oral intake related to acute illness as evidenced by NPO status.  GOAL:   Patient will meet greater than or equal to 90% of their needs  MONITOR:   Diet advancement, PO intake, Labs, Weight trends, I & O's  REASON FOR ASSESSMENT:   Rounds    ASSESSMENT:    POD #7 s/p colostomy takedown with loop ileostomy Pt with persistent pain, vomiting, decreased po intake Currently NPO, NG tube inserted last night. Pt with >2L emesis/NG output in 24 hours. 275 mL via ileosotomy this AM  Per Alcario DroughtErica RN, pt feeling better this AM, NG tube clamped and pt allowed ice chips. Pt did not eat anything yesterday but prior to this pt eating 100% of meals  Noted if pt tolerates this, plan for NG removal tomorrow Labs: reviewed Meds: D5-LR at 150 ml/hr  Diet Order:  Diet NPO time specified Except for: Ice Chips, Sips with Meds  Skin:  Reviewed, no issues  Last BM:  2/21  Height:   Ht Readings from Last 1 Encounters:  03/08/16 5\' 6"  (1.676 m)    Weight:   Wt Readings from Last 1 Encounters:  03/08/16 276 lb 14.4 oz (125.6 kg)    BMI:  Body mass index is 44.69 kg/m.  Estimated Nutritional Needs:   Kcal:  2000-2400 kcals  Protein:  >/= 125 g  Fluid:  >/= 2 L  EDUCATION NEEDS:   Education needs addressed  Romelle Starcherate Feiga Nadel MS, RD, LDN [phone] Pager  [phone] Weekend/On-Call Pager

## 2016-03-16 NOTE — Progress Notes (Signed)
Gastric tube placed as ordered in the Right nares at 58 cm and placement verified by xray.

## 2016-03-16 NOTE — Progress Notes (Signed)
Pt IV occluded with mild swelling of the lover left forearm. Pt is a hard stick. MD notified and consideration will be mad for a PICC line.

## 2016-03-17 ENCOUNTER — Ambulatory Visit: Payer: BLUE CROSS/BLUE SHIELD | Admitting: Primary Care

## 2016-03-17 DIAGNOSIS — Z0289 Encounter for other administrative examinations: Secondary | ICD-10-CM

## 2016-03-17 LAB — BASIC METABOLIC PANEL
Anion gap: 6 (ref 5–15)
BUN: 12 mg/dL (ref 6–20)
CHLORIDE: 101 mmol/L (ref 101–111)
CO2: 33 mmol/L — AB (ref 22–32)
CREATININE: 0.87 mg/dL (ref 0.44–1.00)
Calcium: 8.3 mg/dL — ABNORMAL LOW (ref 8.9–10.3)
GFR calc Af Amer: >60 mL/min (ref >60)
GFR calc non Af Amer: >60 mL/min (ref >60)
GLUCOSE: 89 mg/dL (ref 65–99)
Potassium: 4.1 mmol/L (ref 3.5–5.1)
Sodium: 140 mmol/L (ref 135–145)

## 2016-03-17 LAB — MAGNESIUM: Magnesium: 2 mg/dL (ref 1.7–2.4)

## 2016-03-17 NOTE — Progress Notes (Signed)
Patient ID: Barbara Hawkins, female   DOB: 11/15/1975, 41 y.o.   MRN: [id number]  Patient voicing no new complaints this morning. NG tube was clamped and patient has not had any complaints of nausea nor emesis since then.  AVSS Abdomen is soft and nontender. There is small amount of serosanguinous drainage from the midportion of the laparotomy incision. Staples in this area were removed. There is no surrounding redness or induration. The drains from the 2 UQ incisions were removed.  Ileostomy is intact and functioning well.  Good progress  Plan to remove NG tube and start on clear liquid diet today.

## 2016-03-18 MED ORDER — AMOXICILLIN-POT CLAVULANATE 875-125 MG PO TABS
1.0000 | ORAL_TABLET | Freq: Two times a day (BID) | ORAL | Status: DC
  Administered 2016-03-18 – 2016-03-19 (×3): 1 via ORAL
  Filled 2016-03-18 (×3): qty 1

## 2016-03-18 NOTE — Progress Notes (Signed)
Patient ID: Barbara Hawkins, female   DOB: 02/01/1975, 41 y.o.   MRN: [id number] Patient states she is feeling better today.  AVSS She has not reported any nausea or vomiting since being started on a clear liquid diet.   Abdomen- soft and non- distended. Ileostomy is intact and output is good.The upper half of her midline incision exhibits a murky appearing drainage on dressings this AM.  Plan- culture incisional drainage. Will start patient on augmentin following culture. Will advance to a regular diet.  Will discharge home tomorrow if patient remains stable.

## 2016-03-19 MED ORDER — AMOXICILLIN-POT CLAVULANATE 875-125 MG PO TABS: 1.0000 | ORAL_TABLET | Freq: Two times a day (BID) | ORAL | 0 refills | Status: DC

## 2016-03-19 NOTE — Progress Notes (Signed)
03/19/2016 10:53 AM  Felicity CoyerAmanda P Neidig to be D/C'd Home per MD order.  Discussed prescriptions and follow up appointments with the patient. Prescriptions given to patient, medication list explained in detail. Pt verbalized understanding.  Allergies as of 03/19/2016      Reactions   Morphine Hives, Itching, Swelling   Septra [sulfamethoxazole-trimethoprim] Hives, Itching, Swelling   Tape Itching, Other (See Comments)   "redness, and skin tears very easily," Paper Tape is okay"      Medication List    TAKE these medications   acetaminophen 500 MG tablet Commonly known as:  TYLENOL Take 1,000 mg by mouth every 6 (six) hours as needed for mild pain or moderate pain.   amoxicillin-clavulanate 875-125 MG tablet Commonly known as:  AUGMENTIN Take 1 tablet by mouth every 12 (twelve) hours. Notes to patient:  Last dose was administered on Sunday 03/19/16 at 9:12 am   dexlansoprazole 60 MG capsule Commonly known as:  DEXILANT Take 1 capsule (60 mg total) by mouth daily. What changed:  when to take this   ondansetron 4 MG disintegrating tablet Commonly known as:  ZOFRAN-ODT Take 1 tablet (4 mg total) by mouth every 8 (eight) hours as needed for nausea or vomiting.   oxyCODONE 5 MG immediate release tablet Commonly known as:  Oxy IR/ROXICODONE Take 1-2 tablets (5-10 mg total) by mouth every 6 (six) hours as needed for moderate pain or severe pain (take with percocet 5-325, 1-2 tabs q 6 hours prn).   oxyCODONE-acetaminophen 5-325 MG tablet Commonly known as:  PERCOCET/ROXICET Take 1-2 tablets by mouth every 6 (six) hours as needed for moderate pain or severe pain.   pantoprazole 40 MG tablet Commonly known as:  PROTONIX Take 1 tablet (40 mg total) by mouth daily. Notes to patient:  Last dose was given on Sunday 03/19/16 at 9:12 am       Vitals:   03/19/16 0631 03/19/16 0632  BP:    Pulse:    Resp:    Temp: 98.5 F (36.9 C) 98.6 F (37 C)    Skin clean, dry and intact without  evidence of skin break down, no evidence of skin tears noted. IV catheter discontinued intact. Site without signs and symptoms of complications. Dressing and pressure applied. Pt denies pain at this time. No complaints noted.  An After Visit Summary was printed and given to the patient. Patient escorted via WC, and D/C home via private auto.  Bradly Chrisougherty, Kash [place] E

## 2016-03-19 NOTE — Care Management Note (Signed)
Case Management Note  Patient Details  Name: Barbara Hawkins MRN: [id number] Date of Birth: 01/12/1976  Subjective/Objective:   [facility] and [facility] initial [facility] was made to [facility] earlier this week.                  Action/Plan:   Expected Discharge Date:  03/19/16               Expected Discharge Plan:  Home w Home Health Services  In-House Referral:     Discharge planning Services  CM Consult  Post Acute Care Choice:    Choice offered to:  Patient  DME Arranged:    DME Agency:     HH Arranged:  RN HH Agency:  [facility]  Status of Service:  Completed, signed off  If discussed at Long Length of Stay Meetings, dates discussed:    Additional Comments:  Kanika Bungert A, RN 03/19/2016, 9:56 AM

## 2016-03-19 NOTE — Final Progress Note (Addendum)
No com-plaints. VSS. Afebrile. Tolerating diet. Ostomy intact and functioning well. Drainage from abd incision is minimal. Doing well.  OK to discharge. Culture from abd wound drainage pending. Will send home with Augmentin

## 2016-03-20 ENCOUNTER — Encounter: Payer: Self-pay | Admitting: General Surgery

## 2016-03-23 LAB — AEROBIC/ANAEROBIC CULTURE (SURGICAL/DEEP WOUND)

## 2016-03-23 LAB — AEROBIC/ANAEROBIC CULTURE W GRAM STAIN (SURGICAL/DEEP WOUND)

## 2016-03-24 ENCOUNTER — Encounter: Payer: Self-pay | Admitting: General Surgery

## 2016-03-24 ENCOUNTER — Ambulatory Visit (INDEPENDENT_AMBULATORY_CARE_PROVIDER_SITE_OTHER): Payer: BLUE CROSS/BLUE SHIELD | Admitting: General Surgery

## 2016-03-24 VITALS — BP 144/104 | HR 100 | Temp 97.6°F | Ht 66.0 in | Wt 261.2 lb

## 2016-03-24 DIAGNOSIS — Z4889 Encounter for other specified surgical aftercare: Secondary | ICD-10-CM

## 2016-03-24 MED ORDER — OXYCODONE HCL 5 MG PO TABS: 5.0000 mg | ORAL_TABLET | Freq: Four times a day (QID) | ORAL | 0 refills | Status: DC | PRN

## 2016-03-24 NOTE — Progress Notes (Signed)
Barium Enema scheduled for 04/25/16 @ 9:00 am.  Patient nothing to eat /drink after midnight . Patient must pick up prep kit prior to testing. Patient notified of the above listed information and verbalized understanding. Barbara Hawkins
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
## 2016-03-24 NOTE — Patient Instructions (Signed)
We will scheduled your imaging study and call you later today with the date and time. Please see your follow up appointment listed below. Please call our office if you have questions or concerns.

## 2016-03-27 ENCOUNTER — Telehealth: Payer: Self-pay

## 2016-03-27 MED ORDER — FLUCONAZOLE 200 MG PO TABS: 200.0000 mg | ORAL_TABLET | Freq: Every day | ORAL | 0 refills | Status: DC

## 2016-03-27 NOTE — Telephone Encounter (Signed)
Patient has a yeast infection from the antibiotic she has been taken. She'd like to know if a nurse can send a bactrim prescription to her [facility] pharmacy.

## 2016-03-27 NOTE — Telephone Encounter (Signed)
Diflucan has been sent to patient's pharmacy at this time.  Patient has been made aware.

## 2016-03-29 ENCOUNTER — Encounter: Payer: Self-pay | Admitting: General Surgery

## 2016-03-29 ENCOUNTER — Telehealth: Payer: Self-pay

## 2016-03-29 ENCOUNTER — Encounter: Payer: BLUE CROSS/BLUE SHIELD | Admitting: General Surgery

## 2016-03-29 ENCOUNTER — Ambulatory Visit (INDEPENDENT_AMBULATORY_CARE_PROVIDER_SITE_OTHER): Payer: BLUE CROSS/BLUE SHIELD | Admitting: General Surgery

## 2016-03-29 DIAGNOSIS — Z4889 Encounter for other specified surgical aftercare: Secondary | ICD-10-CM

## 2016-03-29 MED ORDER — AMOXICILLIN-POT CLAVULANATE 875-125 MG PO TABS: 1.0000 | ORAL_TABLET | Freq: Two times a day (BID) | ORAL | 0 refills | Status: DC

## 2016-03-29 MED ORDER — OXYCODONE HCL 5 MG PO TABS: 5.0000 mg | ORAL_TABLET | Freq: Four times a day (QID) | ORAL | 0 refills | Status: DC | PRN

## 2016-03-29 MED ORDER — AMOXICILLIN-POT CLAVULANATE 875-125 MG PO TABS: 1.0000 | ORAL_TABLET | Freq: Two times a day (BID) | ORAL | Status: DC
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
## 2016-03-29 NOTE — Patient Instructions (Signed)
Please finish all current antibiotic and then begin the new prescription of Antibiotic.  WE will follow up with you next week. Please see that appointment listed below.

## 2016-03-29 NOTE — Telephone Encounter (Signed)
Spoke with Dr. Tonita CongWoodham. Patient has been put on schedule for this afternoon to be seen in office.   Call returned to patient. No answer on either number. Messages left on both machines with appointment time and asking for return phone call.

## 2016-03-29 NOTE — Telephone Encounter (Signed)
Patient had a Colostomy Reversal on 03/08/16. She has been changing her site about three times a week. She has been oozing before she got out of the hospital but notices an increase of oozing over the weekend. She just finished her antibiotics and her next appointment with Dr. Tonita CongWoodham is set for 04/13/16. Patient denies fever/chills, and vomiting. She has been nauseous since the surgery. Dr. Luther BradleyWoodhams first open appointment date is on 04/05/16  Renee RivalSherry Miles, home health. [phone].  Please call patient and advice.

## 2016-03-29 NOTE — Telephone Encounter (Signed)
Patient returned phone call and confirmed this afternoon's appointment.

## 2016-04-07 ENCOUNTER — Encounter: Payer: BLUE CROSS/BLUE SHIELD | Admitting: Surgery

## 2016-04-13 ENCOUNTER — Encounter: Payer: Self-pay | Admitting: General Surgery

## 2016-04-13 ENCOUNTER — Ambulatory Visit (INDEPENDENT_AMBULATORY_CARE_PROVIDER_SITE_OTHER): Payer: BLUE CROSS/BLUE SHIELD | Admitting: General Surgery

## 2016-04-13 VITALS — BP 141/80 | HR 83 | Temp 97.3°F | Ht 66.0 in | Wt 246.0 lb

## 2016-04-13 DIAGNOSIS — Z4889 Encounter for other specified surgical aftercare: Secondary | ICD-10-CM
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
## 2016-04-13 NOTE — Patient Instructions (Addendum)
Follow-up after Barium Enema as schedule below

## 2016-04-18 ENCOUNTER — Other Ambulatory Visit: Payer: Self-pay

## 2016-04-18 DIAGNOSIS — Z933 Colostomy status: Secondary | ICD-10-CM

## 2016-04-25 ENCOUNTER — Ambulatory Visit: Payer: No Typology Code available for payment source

## 2016-05-01 ENCOUNTER — Ambulatory Visit: Admission: RE | Admit: 2016-05-01 | Payer: No Typology Code available for payment source | Source: Ambulatory Visit

## 2016-05-03 ENCOUNTER — Other Ambulatory Visit: Payer: Self-pay

## 2016-05-03 ENCOUNTER — Ambulatory Visit
Admission: RE | Admit: 2016-05-03 | Discharge: 2016-05-03 | Disposition: A | Discharge status: Home / Self Care | Payer: BLUE CROSS/BLUE SHIELD | Source: Ambulatory Visit | Attending: General Surgery | Admitting: General Surgery

## 2016-05-03 ENCOUNTER — Encounter: Payer: Self-pay | Admitting: General Surgery

## 2016-05-03 ENCOUNTER — Ambulatory Visit (INDEPENDENT_AMBULATORY_CARE_PROVIDER_SITE_OTHER): Payer: BLUE CROSS/BLUE SHIELD | Admitting: General Surgery

## 2016-05-03 VITALS — BP 148/80 | HR 76 | Temp 98.4°F | Ht 66.0 in | Wt 248.8 lb

## 2016-05-03 DIAGNOSIS — IMO0002 Reserved for concepts with insufficient information to code with codable children: Secondary | ICD-10-CM

## 2016-05-03 DIAGNOSIS — Z9889 Other specified postprocedural states: Secondary | ICD-10-CM

## 2016-05-03 DIAGNOSIS — Z933 Colostomy status: Secondary | ICD-10-CM | POA: Insufficient documentation

## 2016-05-03 DIAGNOSIS — K5669 Other partial intestinal obstruction: Secondary | ICD-10-CM

## 2016-05-03 NOTE — Patient Instructions (Addendum)
We have arranged to do your Colonoscopy with Dr. Servando Snare on 05/09/16. The only prep that you will need to do is 2 Fleet's enema the night prior to the Colonoscopy. Please see your paperwork provided. Dr. Tonita Cong will be present for this Colonoscopy, then we will discuss when to place you on the schedule for your reversal vs. Going to a [facility], depending on the results of this.  Please call with any questions or concerns.
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
## 2016-05-08 ENCOUNTER — Telehealth: Payer: Self-pay

## 2016-05-08 ENCOUNTER — Telehealth: Payer: Self-pay | Admitting: Gastroenterology

## 2016-05-08 NOTE — Telephone Encounter (Signed)
05/08/16 Spoke with Judeth Cornfield at [place] and NO prior auth required for Colonoscopy 82956 / K56.69 other intestinal obstruction.

## 2016-05-08 NOTE — Telephone Encounter (Signed)
Patient called in to reschedule her Colonoscopy with Dr. Servando Snare scheduled tomorrow due to transportation difficulty. Patient states that she is out of town currently and does not have a way back.   Spoke with Ginger, Dr. Annabell Sabal nurse. This Colonoscopy has been rescheduled for 05/30/16 so that Dr. Tonita Cong may be present.  Call has been made to the patient. She verbalizes understanding and that she will need to complete her 2 Fleets enema, the night prior to her Colonoscopy. She has been encouraged to call back with any questions or concerns.

## 2016-05-09 ENCOUNTER — Ambulatory Visit
Admission: RE | Admit: 2016-05-09 | Payer: BLUE CROSS/BLUE SHIELD | Source: Ambulatory Visit | Admitting: Gastroenterology

## 2016-05-09 ENCOUNTER — Encounter: Admission: RE | Payer: Self-pay | Source: Ambulatory Visit

## 2016-05-09 SURGERY — COLONOSCOPY WITH PROPOFOL
Anesthesia: General

## 2016-05-22 ENCOUNTER — Telehealth: Payer: Self-pay | Admitting: General Surgery

## 2016-05-22 NOTE — Telephone Encounter (Signed)
Patient has some questions about procedure next week.

## 2016-05-22 NOTE — Telephone Encounter (Signed)
Called patient back but had to leave her a voicemail. I left her our phone number as well as [facility] since she was asking a question about her Colonoscopy for next week.

## 2016-05-23 NOTE — Telephone Encounter (Signed)
Call made to patient once again at this time. No answer. Left voicemail for return phone call and also left number to [facility] in case they could help patient.

## 2016-05-24 NOTE — Telephone Encounter (Signed)
Call made to patient once again. Answered all questions for patient that she had. She would like to have an appointment with Dr. Tonita Cong prior to having Colonoscopy. I explained that he would be with her at time of Colonoscopy and she was satisfied with this. She just wanted to make sure that he was involved in this process because she is scared and he knows her best. Encouragement given. She is encouraged to call back with any further questions or concerns.

## 2016-05-30 ENCOUNTER — Ambulatory Visit
Admission: RE | Admit: 2016-05-30 | Discharge: 2016-05-30 | Disposition: A | Discharge status: Home / Self Care | Payer: BLUE CROSS/BLUE SHIELD | Source: Ambulatory Visit | Attending: Gastroenterology | Admitting: Gastroenterology

## 2016-05-30 ENCOUNTER — Encounter
Admission: RE | Disposition: A | Discharge status: Home / Self Care | Payer: Self-pay | Source: Ambulatory Visit | Attending: Gastroenterology

## 2016-05-30 ENCOUNTER — Encounter: Payer: Self-pay | Admitting: *Deleted

## 2016-05-30 ENCOUNTER — Telehealth: Payer: Self-pay

## 2016-05-30 ENCOUNTER — Ambulatory Visit: Payer: BLUE CROSS/BLUE SHIELD | Admitting: Certified Registered Nurse Anesthetist

## 2016-05-30 DIAGNOSIS — R933 Abnormal findings on diagnostic imaging of other parts of digestive tract: Secondary | ICD-10-CM

## 2016-05-30 DIAGNOSIS — I493 Ventricular premature depolarization: Secondary | ICD-10-CM | POA: Insufficient documentation

## 2016-05-30 DIAGNOSIS — K5669 Other partial intestinal obstruction: Secondary | ICD-10-CM

## 2016-05-30 DIAGNOSIS — Z9851 Tubal ligation status: Secondary | ICD-10-CM | POA: Insufficient documentation

## 2016-05-30 DIAGNOSIS — Z9889 Other specified postprocedural states: Secondary | ICD-10-CM | POA: Insufficient documentation

## 2016-05-30 DIAGNOSIS — K9181 Other intraoperative complications of digestive system: Secondary | ICD-10-CM

## 2016-05-30 DIAGNOSIS — I7300 Raynaud's syndrome without gangrene: Secondary | ICD-10-CM | POA: Insufficient documentation

## 2016-05-30 DIAGNOSIS — Z8349 Family history of other endocrine, nutritional and metabolic diseases: Secondary | ICD-10-CM | POA: Insufficient documentation

## 2016-05-30 DIAGNOSIS — Z841 Family history of disorders of kidney and ureter: Secondary | ICD-10-CM | POA: Insufficient documentation

## 2016-05-30 DIAGNOSIS — Z9049 Acquired absence of other specified parts of digestive tract: Secondary | ICD-10-CM | POA: Insufficient documentation

## 2016-05-30 DIAGNOSIS — Z8249 Family history of ischemic heart disease and other diseases of the circulatory system: Secondary | ICD-10-CM | POA: Insufficient documentation

## 2016-05-30 DIAGNOSIS — Z79899 Other long term (current) drug therapy: Secondary | ICD-10-CM | POA: Insufficient documentation

## 2016-05-30 DIAGNOSIS — K529 Noninfective gastroenteritis and colitis, unspecified: Secondary | ICD-10-CM | POA: Insufficient documentation

## 2016-05-30 DIAGNOSIS — M19072 Primary osteoarthritis, left ankle and foot: Secondary | ICD-10-CM | POA: Insufficient documentation

## 2016-05-30 DIAGNOSIS — Z91048 Other nonmedicinal substance allergy status: Secondary | ICD-10-CM | POA: Insufficient documentation

## 2016-05-30 DIAGNOSIS — K6389 Other specified diseases of intestine: Secondary | ICD-10-CM

## 2016-05-30 DIAGNOSIS — K219 Gastro-esophageal reflux disease without esophagitis: Secondary | ICD-10-CM | POA: Insufficient documentation

## 2016-05-30 DIAGNOSIS — K5730 Diverticulosis of large intestine without perforation or abscess without bleeding: Secondary | ICD-10-CM | POA: Insufficient documentation

## 2016-05-30 DIAGNOSIS — I739 Peripheral vascular disease, unspecified: Secondary | ICD-10-CM | POA: Insufficient documentation

## 2016-05-30 DIAGNOSIS — K56699 Other intestinal obstruction unspecified as to partial versus complete obstruction: Secondary | ICD-10-CM | POA: Insufficient documentation

## 2016-05-30 DIAGNOSIS — Z883 Allergy status to other anti-infective agents status: Secondary | ICD-10-CM | POA: Insufficient documentation

## 2016-05-30 DIAGNOSIS — Z823 Family history of stroke: Secondary | ICD-10-CM | POA: Insufficient documentation

## 2016-05-30 DIAGNOSIS — Q796 Ehlers-Danlos syndrome: Secondary | ICD-10-CM | POA: Insufficient documentation

## 2016-05-30 DIAGNOSIS — M359 Systemic involvement of connective tissue, unspecified: Secondary | ICD-10-CM | POA: Insufficient documentation

## 2016-05-30 DIAGNOSIS — Z885 Allergy status to narcotic agent status: Secondary | ICD-10-CM | POA: Insufficient documentation

## 2016-05-30 HISTORY — PX: COLONOSCOPY WITH PROPOFOL: SHX5780

## 2016-05-30 LAB — POCT PREGNANCY, URINE: PREG TEST UR: NEGATIVE

## 2016-05-30 SURGERY — COLONOSCOPY WITH PROPOFOL
Anesthesia: General

## 2016-05-30 MED ORDER — PROPOFOL 500 MG/50ML IV EMUL
INTRAVENOUS | Status: DC | PRN
  Administered 2016-05-30: 150 ug/kg/min via INTRAVENOUS

## 2016-05-30 MED ORDER — PROPOFOL 10 MG/ML IV BOLUS
INTRAVENOUS | Status: DC | PRN
  Administered 2016-05-30: 10 mg via INTRAVENOUS
  Administered 2016-05-30: 70 mg via INTRAVENOUS

## 2016-05-30 MED ORDER — LIDOCAINE HCL (CARDIAC) 20 MG/ML IV SOLN
INTRAVENOUS | Status: DC | PRN
  Administered 2016-05-30: 40 mg via INTRAVENOUS

## 2016-05-30 MED ORDER — MIDAZOLAM HCL 2 MG/2ML IJ SOLN
INTRAMUSCULAR | Status: DC | PRN
  Administered 2016-05-30: 2 mg via INTRAVENOUS

## 2016-05-30 MED ORDER — LIDOCAINE HCL 2 % IJ SOLN
INTRAMUSCULAR | Status: AC
  Filled 2016-05-30: qty 10

## 2016-05-30 MED ORDER — PROPOFOL 500 MG/50ML IV EMUL
INTRAVENOUS | Status: AC
  Filled 2016-05-30: qty 50

## 2016-05-30 MED ORDER — MIDAZOLAM HCL 2 MG/2ML IJ SOLN
INTRAMUSCULAR | Status: AC
  Filled 2016-05-30: qty 2

## 2016-05-30 MED ORDER — SODIUM CHLORIDE 0.9 % IV SOLN
INTRAVENOUS | Status: DC
  Administered 2016-05-30: 10:00:00 via INTRAVENOUS

## 2016-05-30 MED ORDER — LIDOCAINE HCL (PF) 1 % IJ SOLN
INTRAMUSCULAR | Status: AC
  Filled 2016-05-30: qty 2

## 2016-05-30 NOTE — Telephone Encounter (Signed)
Patient returned call. She would like for Amber to return her call.  [facility]

## 2016-05-30 NOTE — Telephone Encounter (Signed)
Spoke with patient at this time. She is confused as to what surgical plan is. She feels like the surgery should have just been scheduled and is confused with upcoming appointment. I explained that I will speak with Dr. Tonita CongWoodham and return her phone call as soon as I have answers for her.

## 2016-05-30 NOTE — Telephone Encounter (Signed)
Left message for patient to office regarding appointment 06/08/16 @ 10:15 with Dr.Woodham .  Appointment reminder mailed.

## 2016-05-30 NOTE — Anesthesia Post-op Follow-up Note (Cosign Needed)
Anesthesia QCDR form completed.        

## 2016-05-30 NOTE — Op Note (Signed)
[facility] Gastroenter[facility] Patient Name: Barbara Hawkins Procedure Date: 05/30/2016 10:05 AM MRN: [id number] Account #: [id number] Date of Birth: 11-27-1975 Admit Type: Outpatient Age: 41 Room: [facility] ENDO ROOM 4 Gender: Female Note Status: Finalized Procedure:            Colonoscopy Indications:          Abnormal barium enema Providers:            Midge Minium MD, MD Referring MD:         No Local Md, MD (Referring MD) Medicines:            Propofol per Anesthesia Complications:        No immediate complications. Procedure:            Pre-Anesthesia Assessment:                       - Prior to the procedure, a History and Physical was                        performed, and patient medications and allergies were                        reviewed. The patient's tolerance of previous                        anesthesia was also reviewed. The risks and benefits of                        the procedure and the sedation options and risks were                        discussed with the patient. All questions were                        answered, and informed consent was obtained. Prior                        Anticoagulants: The patient has taken no previous                        anticoagulant or antiplatelet agents. ASA Grade                        Assessment: II - A patient with mild systemic disease.                        After reviewing the risks and benefits, the patient was                        deemed in satisfactory condition to undergo the                        procedure.                       After obtaining informed consent, the colonoscope was                        passed under direct vision. Throughout the procedure,  the patient's blood pressure, pulse, and oxygen                        saturations were monitored continuously. The                        Colonoscope was introduced through the anus and                        advanced to the  the cecum, identified by appendiceal                        orifice and ileocecal valve. The colonoscopy was                        performed without difficulty. The patient tolerated the                        procedure well. The quality of the bowel preparation                        was fair. Findings:      The perianal and digital rectal examinations were normal.      A benign-appearing, intrinsic mild stenosis was found in the transverse       colon and was traversed.      A diffuse area of moderately friable mucosa was found in the entire       colon.      Multiple small-mouthed diverticula were found in the sigmoid colon,       descending colon and transverse colon. Impression:           - Preparation of the colon was fair.                       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                       - Friability in the entire examined colon.                       - Diverticulosis in the sigmoid colon, in the                        descending colon and in the transverse colon.                       - No specimens collected.                       - Diversion colitis. Recommendation:       - Discharge patient to home.                       - Resume previous diet.                       - Continue present medications. Procedure Code(s):    --- Professional ---                       [phone], Colonoscopy, flexible; diagnostic, including  collection of specimen(s) by brushing or washing, when                        performed (separate procedure) Diagnosis Code(s):    --- Professional ---                       R93.3, Abnormal findings on diagnostic imaging of other                        parts of digestive tract                       K56.69, Other intestinal obstruction                       K63.89, Other specified diseases of intestine CPT copyright 2016 American Medical Association. All rights reserved. The codes documented in this report are preliminary and  upon coder review may  be revised to meet current compliance requirements. Midge Minium MD, MD 05/30/2016 10:24:13 AM This report has been signed electronically. Number of Addenda: 0 Note Initiated On: 05/30/2016 10:05 AM Scope Withdrawal Time: 0 hours 6 minutes 7 seconds  Total Procedure Duration: 0 hours 9 minutes 15 seconds       [facility]

## 2016-05-30 NOTE — Anesthesia Postprocedure Evaluation (Signed)
Anesthesia Post Note  Patient: Barbara Hawkins  Procedure(s) Performed: Procedure(s) (LRB): COLONOSCOPY WITH PROPOFOL (N/A)  Patient location during evaluation: Endoscopy Anesthesia Type: General Level of consciousness: awake and alert Pain management: pain level controlled Vital Signs Assessment: post-procedure vital signs reviewed and stable Respiratory status: spontaneous breathing, nonlabored ventilation, respiratory function stable and patient connected to nasal cannula oxygen Cardiovascular status: blood pressure returned to baseline and stable Postop Assessment: no signs of nausea or vomiting Anesthetic complications: no     Last Vitals:  Vitals:   05/30/16 1035 05/30/16 1045  BP: (!) 101/59 103/73  Pulse: 70 68  Resp: (!) 21 16  Temp:      Last Pain:  Vitals:   05/30/16 1025  TempSrc: Tympanic                 Aldeen Riga S
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
## 2016-05-30 NOTE — Transfer of Care (Signed)
Immediate Anesthesia Transfer of Care Note  Patient: Barbara Hawkins  Procedure(s) Performed: Procedure(s): COLONOSCOPY WITH PROPOFOL (N/A)  Patient Location: PACU and Endoscopy Unit  Anesthesia Type:General  Level of Consciousness: patient cooperative and lethargic  Airway & Oxygen Therapy: Patient Spontanous Breathing and Patient connected to nasal cannula oxygen  Post-op Assessment: Report given to RN and Post -op Vital signs reviewed and stable  Post vital signs: Reviewed and stable  Last Vitals:  Vitals:   05/30/16 0829 05/30/16 1025  BP: 125/85 (!) 93/58  Pulse: 85 77  Resp: 18 (!) 71  Temp: 37.2 C 36.6 C    Last Pain:  Vitals:   05/30/16 1025  TempSrc: Tympanic      Patients Stated Pain Goal: 0 (05/30/16 0829)  Complications: No apparent anesthesia complications
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
## 2016-05-31 ENCOUNTER — Telehealth: Payer: Self-pay

## 2016-05-31 NOTE — Telephone Encounter (Signed)
Patient spoke with Joice LoftsAmber yesterday with questions pertaining to her upcoming appointment and surgery. Amber told the patient she would follow up with her as soon as she talks to Dr. Tonita CongWoodham.   Patient is now calling to remind Amber to speak with Dr. Tonita CongWoodham and to give her a call back.   Please call patient and advice.

## 2016-06-01 NOTE — Telephone Encounter (Signed)
Spoke with Dr. Tonita CongWoodham in regards to results of Colonoscopy and plan of care going forward.  Call made to patient. Results and plan reviewed. Patient will continue with appointment next week in office to speak about reversal in detail and get a surgery date pinned down. She is comfortable with this plan. She will call back with any further questions or concerns prior to her scheduled appointment.

## 2016-06-06 ENCOUNTER — Encounter: Payer: Self-pay | Admitting: Gastroenterology

## 2016-06-08 ENCOUNTER — Encounter: Payer: BLUE CROSS/BLUE SHIELD | Admitting: General Surgery

## 2016-06-16 ENCOUNTER — Ambulatory Visit: Payer: BLUE CROSS/BLUE SHIELD | Admitting: General Surgery

## 2016-06-21 ENCOUNTER — Encounter: Payer: Self-pay | Admitting: General Surgery

## 2016-06-21 ENCOUNTER — Ambulatory Visit (INDEPENDENT_AMBULATORY_CARE_PROVIDER_SITE_OTHER): Payer: Self-pay | Admitting: General Surgery

## 2016-06-21 VITALS — BP 154/110 | HR 93 | Temp 97.3°F | Ht 66.0 in | Wt 240.0 lb

## 2016-06-21 DIAGNOSIS — Z932 Ileostomy status: Secondary | ICD-10-CM
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
## 2016-06-21 NOTE — Patient Instructions (Addendum)
Please look at your blue sheet in case you have any questions or concerns about your surgery.  Please remember to be on a clear liquid diet two days before your surgery.

## 2016-06-23 ENCOUNTER — Telehealth: Payer: Self-pay | Admitting: General Surgery

## 2016-06-23 NOTE — Telephone Encounter (Signed)
Pt advised of pre op date/time and sx date. Sx: 07/05/16 with Dr Tonita CongWoodham, Dr Aleen CampiPiscoya assisting--Loop ileostomy takedown.  Pre op: 06/28/16 @ 10:15am--Office.   Patient made aware to call [phone], between 1-3:00pm the day before surgery, to find out what time to arrive.

## 2016-06-27 ENCOUNTER — Telehealth: Payer: Self-pay

## 2016-06-27 NOTE — Telephone Encounter (Signed)
Patient called to add a number to her chart. [phone] is the number she would like to be contacted on at this time

## 2016-06-28 ENCOUNTER — Telehealth: Payer: Self-pay | Admitting: General Surgery

## 2016-06-28 ENCOUNTER — Inpatient Hospital Stay: Admission: RE | Admit: 2016-06-28 | Payer: Self-pay | Source: Ambulatory Visit

## 2016-06-28 NOTE — Telephone Encounter (Signed)
Patient would like to cancel surgery at this time and will call back to reschedule. Patient canceled due to no job at this time and does not have funds to pay towards surgery.   I have contacted the OR and pre admit to cancel all appointments.  Dr Tonita CongWoodham has been advised.

## 2016-07-05 ENCOUNTER — Encounter: Admission: RE | Payer: Self-pay | Source: Ambulatory Visit

## 2016-07-05 ENCOUNTER — Inpatient Hospital Stay: Admission: RE | Admit: 2016-07-05 | Payer: Self-pay | Source: Ambulatory Visit | Admitting: General Surgery

## 2016-07-05 SURGERY — CLOSURE, ILEOSTOMY
Anesthesia: Choice

## 2016-07-19 ENCOUNTER — Telehealth: Payer: Self-pay | Admitting: General Practice

## 2016-07-19 NOTE — Telephone Encounter (Signed)
Patient called back and left a message on 07/18/16 at 5:34pm, she said she needed to talk with you about surgery. Please call patient and advice.

## 2016-07-19 NOTE — Telephone Encounter (Signed)
I have called patient back to answer questions about rescheduling surgery. No answer. I have left a message on voicemail.

## 2016-07-26 ENCOUNTER — Emergency Department: Payer: Self-pay

## 2016-07-26 ENCOUNTER — Emergency Department
Admission: EM | Admit: 2016-07-26 | Discharge: 2016-07-26 | Disposition: A | Discharge status: Home / Self Care | Payer: Self-pay | Attending: Emergency Medicine | Admitting: Emergency Medicine

## 2016-07-26 DIAGNOSIS — M79605 Pain in left leg: Secondary | ICD-10-CM | POA: Insufficient documentation

## 2016-07-26 DIAGNOSIS — M7981 Nontraumatic hematoma of soft tissue: Secondary | ICD-10-CM | POA: Insufficient documentation

## 2016-07-26 DIAGNOSIS — R2242 Localized swelling, mass and lump, left lower limb: Secondary | ICD-10-CM | POA: Insufficient documentation

## 2016-07-26 LAB — COMPREHENSIVE METABOLIC PANEL
ALT: 11 U/L — ABNORMAL LOW (ref 14–54)
ANION GAP: 6 (ref 5–15)
AST: 20 U/L (ref 15–41)
Albumin: 3.8 g/dL (ref 3.5–5.0)
Alkaline Phosphatase: 99 U/L (ref 38–126)
BUN: 15 mg/dL (ref 6–20)
CHLORIDE: 107 mmol/L (ref 101–111)
CO2: 24 mmol/L (ref 22–32)
Calcium: 8.7 mg/dL — ABNORMAL LOW (ref 8.9–10.3)
Creatinine, Ser: 0.63 mg/dL (ref 0.44–1.00)
Glucose, Bld: 93 mg/dL (ref 65–99)
POTASSIUM: 4 mmol/L (ref 3.5–5.1)
Sodium: 137 mmol/L (ref 135–145)
Total Bilirubin: 0.9 mg/dL (ref 0.3–1.2)
Total Protein: 7.4 g/dL (ref 6.5–8.1)

## 2016-07-26 LAB — CBC
HCT: 36 % (ref 35.0–47.0)
Hemoglobin: 12.3 g/dL (ref 12.0–16.0)
MCH: 30.2 pg (ref 26.0–34.0)
MCHC: 34 g/dL (ref 32.0–36.0)
MCV: 88.9 fL (ref 80.0–100.0)
PLATELETS: 201 10*3/uL (ref 150–440)
RBC: 4.05 MIL/uL (ref 3.80–5.20)
RDW: 14.1 % (ref 11.5–14.5)
WBC: 9 10*3/uL (ref 3.6–11.0)

## 2016-07-26 LAB — PROTIME-INR
INR: 1.04
PROTHROMBIN TIME: 13.6 s (ref 11.4–15.2)

## 2016-07-26 MED ORDER — ONDANSETRON HCL 4 MG/2ML IJ SOLN
4.0000 mg | Freq: Once | INTRAMUSCULAR | Status: AC
  Administered 2016-07-26: 4 mg via INTRAVENOUS
  Filled 2016-07-26: qty 2

## 2016-07-26 MED ORDER — HYDROMORPHONE HCL 1 MG/ML IJ SOLN
0.5000 mg | Freq: Once | INTRAMUSCULAR | Status: AC
  Administered 2016-07-26: 0.5 mg via INTRAVENOUS
  Filled 2016-07-26: qty 1
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
## 2016-07-26 NOTE — ED Notes (Signed)
Pt resting in bed, family at bedside, pt aware of pending US, denies any needs at this time

## 2016-07-27 ENCOUNTER — Encounter: Payer: Self-pay | Admitting: General Surgery

## 2016-07-27 ENCOUNTER — Ambulatory Visit (INDEPENDENT_AMBULATORY_CARE_PROVIDER_SITE_OTHER): Payer: Self-pay | Admitting: General Surgery

## 2016-07-27 VITALS — BP 122/84 | HR 80 | Temp 98.2°F | Ht 66.0 in | Wt 238.0 lb

## 2016-07-27 DIAGNOSIS — R229 Localized swelling, mass and lump, unspecified: Secondary | ICD-10-CM

## 2016-07-27 DIAGNOSIS — Z932 Ileostomy status: Secondary | ICD-10-CM

## 2016-07-27 NOTE — Patient Instructions (Signed)
I will call you with the date and time of your CT Scan.

## 2016-07-28 ENCOUNTER — Telehealth: Payer: Self-pay

## 2016-07-28 DIAGNOSIS — R229 Localized swelling, mass and lump, unspecified: Secondary | ICD-10-CM | POA: Insufficient documentation
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
## 2016-07-28 NOTE — Addendum Note (Signed)
Addended by: Adela PortsBONICHE, Jalene Demo on: 07/28/2016 10:09 AM   Modules accepted: Orders

## 2016-07-28 NOTE — Telephone Encounter (Signed)
Called patient back and recommended for her to alternate Tylenol with Ibuprofen for her pain on her Left Leg since it was getting worse. Howerver, patient stated that Dr. Tonita CongWoodham had told her that she needed to stop taking Ibuprofen because of her Ehlers-Danlos disease. I then told her that I would send a message to Dr. Tonita CongWoodham since he was covering the night shift at the hospital. Once Dr. Tonita CongWoodham replied back to my question, I told the patient that I would call her back with recommendations. Patient understood and had no further questions.

## 2016-07-28 NOTE — Telephone Encounter (Signed)
Called patient but had to leave her a detailed message letting her know that her CT Scan is scheduled to be done on 08/01/2016 at 11:30 AM but to [facility] at 11:15 [facility] location).

## 2016-07-31 ENCOUNTER — Encounter
Admission: RE | Admit: 2016-07-31 | Discharge: 2016-07-31 | Disposition: A | Discharge status: Home / Self Care | Payer: Self-pay | Source: Ambulatory Visit | Attending: General Surgery | Admitting: General Surgery

## 2016-07-31 DIAGNOSIS — Z01818 Encounter for other preprocedural examination: Secondary | ICD-10-CM | POA: Insufficient documentation

## 2016-07-31 DIAGNOSIS — Z932 Ileostomy status: Secondary | ICD-10-CM | POA: Insufficient documentation

## 2016-07-31 DIAGNOSIS — R229 Localized swelling, mass and lump, unspecified: Secondary | ICD-10-CM | POA: Insufficient documentation

## 2016-07-31 NOTE — Pre-Procedure Instructions (Signed)
WANTS PICC LINE CONSIDERED. STATES BAD STICK

## 2016-07-31 NOTE — Patient Instructions (Signed)
  Your procedure is scheduled on: 08/09/16 Report to Day Surgery.[facility] SECOND FLOOR To find out your arrival time please call [phone] between 1PM - 3PM on 08/08/16  Remember: Instructions that are not followed completely may result in serious medical risk, up to and including death, or upon the discretion of your surgeon and anesthesiologist your surgery may need to be rescheduled.    __X__ 1. Do not eat food or drink liquids after midnight. No gum chewing or hard candies.     __X__ 2. No Alcohol for 24 hours before or after surgery.   ____ 3. Do Not Smoke For 24 Hours Prior to Your Surgery.   ____ 4. Bring all medications with you on the day of surgery if instructed.    _X___ 5. Notify your doctor if there is any change in your medical condition     (cold, fever, infections).       Do not wear jewelry, make-up, hairpins, clips or nail polish.  Do not wear lotions, powders, or perfumes. You may wear deodorant.  Do not shave 48 hours prior to surgery. Men may shave face and neck.  Do not bring valuables to the hospital.    [facility] is not responsible for any belongings or valuables.               Contacts, dentures or bridgework may not be worn into surgery.  Leave your suitcase in the car. After surgery it may be brought to your room.  For patients admitted to the hospital, discharge time is determined by your                treatment team.   Patients discharged the day of surgery will not be allowed to drive home.   Please read over the following fact sheets that you were given:   Surgical Site Infection Prevention   ____ Take these medicines the morning of surgery with A SIP OF WATER:    1.NONE  2.   3.   4.  5.  6.  ____ Fleet Enema (as directed)   __X__ Use CHG Soap as directed  ____ Use inhalers on the day of surgery  ____ Stop metformin 2 days prior to surgery    ____ Take 1/2 of usual insulin dose the night before surgery and none on the morning  of surgery.   ____ Stop Coumadin/Plavix/aspirin on ____ Stop Anti-inflammatories on    ____ Stop supplements until after surgery.    ____ Bring C-Pap to the hospital.

## 2016-07-31 NOTE — Telephone Encounter (Signed)
Called patient back to let her know that Dr. Tonita CongWoodham was okay for her to start taking Ibuprofen if it was used short-term. Patient stated that she thinks that maybe it was her other surgeon not to take them. However, she stated that she would try taking Ibuprofen with Tylenol. I told her to call us back in case that this didn't help her with the pain. Patient had no further questions.

## 2016-08-01 ENCOUNTER — Ambulatory Visit
Admission: RE | Admit: 2016-08-01 | Discharge: 2016-08-01 | Disposition: A | Discharge status: Home / Self Care | Payer: Self-pay | Source: Ambulatory Visit | Attending: General Surgery | Admitting: General Surgery

## 2016-08-01 DIAGNOSIS — R229 Localized swelling, mass and lump, unspecified: Secondary | ICD-10-CM | POA: Insufficient documentation

## 2016-08-01 DIAGNOSIS — M799 Soft tissue disorder, unspecified: Secondary | ICD-10-CM | POA: Insufficient documentation

## 2016-08-01 MED ORDER — IOPAMIDOL (ISOVUE-370) INJECTION 76%
100.0000 mL | Freq: Once | INTRAVENOUS | Status: AC | PRN
  Administered 2016-08-01: 100 mL via INTRAVENOUS

## 2016-08-02 ENCOUNTER — Telehealth: Payer: Self-pay

## 2016-08-02 NOTE — Telephone Encounter (Signed)
Spoke with Dr. Tonita CongWoodham in regards to recent CT Scan. He states he would like to cancel patient's surgery and send referral for patient to be seen by Dr. Tonita CongWoodham in the office to explain the reason for cancellation and to speak about referral. Patient was notified by front desk of this information and they called patient this am. Patient was very upset on phone and would like a return phone call from the nurse explaining this.

## 2016-08-02 NOTE — Telephone Encounter (Signed)
Call made to patient. Reviewed CT results with patient and discussed plan of care that had been previously discussed with Dr. Tonita CongWoodham. Patient is very upset to hear this news. Appointment has been made for her to discuss this further and to make her choice of referral location. She does not wish to choose referral institution until this is discussed with Dr. Tonita CongWoodham. Asked patient to return phone call should she have any further questions or concerns prior to her appointment. She verbalizes understanding of entire conversation.

## 2016-08-03 ENCOUNTER — Telehealth: Payer: Self-pay

## 2016-08-03 NOTE — Telephone Encounter (Signed)
Patient is wondering if there is any way she can skip the upcoming appointment with Dr. Tonita CongWoodham and just get things started with the specialist he is referring her to. She is having financial concerns because she is uninsured and had to borrow money to cover her previous appointment. She also wants to know of any warning signs she should be looking out for. Please call patient and advice.

## 2016-08-03 NOTE — Telephone Encounter (Signed)
This has been previously discussed with patient. I will have the Physician answer any questions that she has at her appointment.

## 2016-08-03 NOTE — Telephone Encounter (Signed)
She will have no copay for this visit upcoming next week.

## 2016-08-03 NOTE — Telephone Encounter (Signed)
Please refer to note documented below

## 2016-08-03 NOTE — Telephone Encounter (Signed)
Okay. May you please call her and let her know of any warning signs that she should be looking for? She seemed extremely concerned over the phone. Thank you

## 2016-08-04 ENCOUNTER — Other Ambulatory Visit: Payer: Self-pay

## 2016-08-07 ENCOUNTER — Ambulatory Visit: Payer: Self-pay | Admitting: General Surgery

## 2016-08-09 ENCOUNTER — Inpatient Hospital Stay: Admission: RE | Admit: 2016-08-09 | Payer: Self-pay | Source: Ambulatory Visit | Admitting: General Surgery

## 2016-08-09 ENCOUNTER — Encounter: Admission: RE | Payer: Self-pay | Source: Ambulatory Visit

## 2016-08-09 SURGERY — CLOSURE, ILEOSTOMY
Anesthesia: Choice

## 2016-08-10 ENCOUNTER — Encounter: Payer: Self-pay | Admitting: General Surgery

## 2016-08-10 ENCOUNTER — Ambulatory Visit: Payer: Self-pay | Admitting: General Surgery

## 2016-08-10 VITALS — HR 111 | Ht 66.0 in | Wt 238.0 lb

## 2016-08-10 DIAGNOSIS — Z932 Ileostomy status: Secondary | ICD-10-CM

## 2016-08-10 NOTE — Patient Instructions (Signed)
We will send you to a specialist at a [facility]. You should be receiving a phone call from hospital scheduling within the next several days.  Please call us if we can help in the process of financial aid and help you in any way.
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
## 2016-08-11 ENCOUNTER — Telehealth: Payer: Self-pay | Admitting: General Surgery

## 2016-08-11 NOTE — Telephone Encounter (Signed)
I have faxed a referral to [facility] for consult of ostomy reversal. [phone].  I have sent as urgent, however the receptionist stated that the nurse will review the records and call patient with an appointment.   I will follow up with this referral to make sure appointment is scheduled in a timely manner.

## 2016-08-14 ENCOUNTER — Telehealth: Payer: Self-pay

## 2016-08-14 NOTE — Telephone Encounter (Signed)
Patient was supposed to have surgery. She had paid $500 and her surgery has been canceled. She is calling because the money hasn't been refunded yet. I gave her the number to [facility] billing and gave her the exact date the money was paid. Patient had no further questions.

## 2016-08-18 ENCOUNTER — Telehealth: Payer: Self-pay

## 2016-08-18 NOTE — Telephone Encounter (Signed)
I have called [facility] to check status of the referral sent. They stated that they have called the patient 3 times and mailed a letter due to no contact.   I have contacted the patient and she stated that she would call them this afternoon to make an appointment.

## 2016-08-18 NOTE — Telephone Encounter (Signed)
Patient called to let you know that her appointment with [facility] is on 09/12/2016. Please give her a call in case you have further questions.

## 2016-08-22 NOTE — Telephone Encounter (Signed)
Angie was notified about patient's appointment.

## 2016-09-12 ENCOUNTER — Ambulatory Visit: Admission: RE | Admit: 2016-09-12 | Discharge: 2016-09-12 | Attending: Surgery | Admitting: Surgery

## 2016-09-12 DIAGNOSIS — Z7189 Other specified counseling: Principal | ICD-10-CM

## 2016-10-02 ENCOUNTER — Emergency Department: Payer: Self-pay

## 2016-10-02 ENCOUNTER — Emergency Department
Admission: EM | Admit: 2016-10-02 | Discharge: 2016-10-02 | Disposition: A | Discharge status: Home / Self Care | Payer: Self-pay | Attending: Emergency Medicine | Admitting: Emergency Medicine

## 2016-10-02 DIAGNOSIS — S76911A Strain of unspecified muscles, fascia and tendons at thigh level, right thigh, initial encounter: Secondary | ICD-10-CM | POA: Insufficient documentation

## 2016-10-02 DIAGNOSIS — Y9301 Activity, walking, marching and hiking: Secondary | ICD-10-CM | POA: Insufficient documentation

## 2016-10-02 DIAGNOSIS — Y999 Unspecified external cause status: Secondary | ICD-10-CM | POA: Insufficient documentation

## 2016-10-02 DIAGNOSIS — S5002XA Contusion of left elbow, initial encounter: Secondary | ICD-10-CM | POA: Insufficient documentation

## 2016-10-02 DIAGNOSIS — S76912A Strain of unspecified muscles, fascia and tendons at thigh level, left thigh, initial encounter: Secondary | ICD-10-CM | POA: Insufficient documentation

## 2016-10-02 DIAGNOSIS — W19XXXA Unspecified fall, initial encounter: Secondary | ICD-10-CM

## 2016-10-02 DIAGNOSIS — Y92009 Unspecified place in unspecified non-institutional (private) residence as the place of occurrence of the external cause: Secondary | ICD-10-CM

## 2016-10-02 DIAGNOSIS — Z79899 Other long term (current) drug therapy: Secondary | ICD-10-CM | POA: Insufficient documentation

## 2016-10-02 DIAGNOSIS — W010XXA Fall on same level from slipping, tripping and stumbling without subsequent striking against object, initial encounter: Secondary | ICD-10-CM | POA: Insufficient documentation

## 2016-10-02 DIAGNOSIS — Y92000 Kitchen of unspecified non-institutional (private) residence as  the place of occurrence of the external cause: Secondary | ICD-10-CM | POA: Insufficient documentation

## 2016-10-02 MED ORDER — OXYCODONE-ACETAMINOPHEN 5-325 MG PO TABS
1.0000 | ORAL_TABLET | Freq: Once | ORAL | Status: AC
  Administered 2016-10-02: 1 via ORAL
  Filled 2016-10-02: qty 1

## 2016-10-02 MED ORDER — OXYCODONE HCL 5 MG PO TABS: 5.0000 mg | ORAL_TABLET | Freq: Four times a day (QID) | ORAL | 0 refills | Status: DC | PRN

## 2016-10-02 MED ORDER — ONDANSETRON 4 MG PO TBDP
4.0000 mg | ORAL_TABLET | Freq: Once | ORAL | Status: AC
  Administered 2016-10-02: 4 mg via ORAL
  Filled 2016-10-02: qty 1

## 2016-10-02 MED ORDER — ONDANSETRON 4 MG PO TBDP: 4.0000 mg | ORAL_TABLET | Freq: Three times a day (TID) | ORAL | 0 refills | Status: DC | PRN
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
## 2016-10-02 NOTE — ED Triage Notes (Signed)
Slip and fall in kitchen left arm pain and right leg pain. Pt alert and oriented X4, active, cooperative, pt in NAD. RR even and unlabored, color WNL.

## 2016-10-02 NOTE — Discharge Instructions (Signed)
Follow-[facility] with your primary care doctor or [facility] if any continued problems. Ice to your elbow and thigh as needed for discomfort. Continue taking Zofran as needed for nausea and oxycodone 1 tablet every 6 hours as needed for pain. Wear sling for support of your left arm. Do not drive or operate machinery while taking the pain medication.

## 2016-10-27 ENCOUNTER — Ambulatory Visit: Admit: 2016-10-27 | Discharge: 2016-10-27 | Disposition: A | Discharge status: Home / Self Care

## 2016-10-27 DIAGNOSIS — R229 Localized swelling, mass and lump, unspecified: Principal | ICD-10-CM

## 2017-02-22 ENCOUNTER — Ambulatory Visit: Admit: 2017-02-22 | Discharge: 2017-02-23 | Attending: Neurological Surgery | Primary: Neurological Surgery

## 2017-02-22 DIAGNOSIS — Q796 Ehlers-Danlos syndrome, unspecified: Principal | ICD-10-CM

## 2017-04-23 ENCOUNTER — Ambulatory Visit: Admit: 2017-04-23 | Discharge: 2017-04-23 | Attending: Internal Medicine | Primary: Internal Medicine

## 2017-04-23 DIAGNOSIS — Q796 Ehlers-Danlos syndrome, unspecified: Secondary | ICD-10-CM

## 2017-04-23 DIAGNOSIS — Z13220 Encounter for screening for lipoid disorders: Secondary | ICD-10-CM

## 2017-04-23 DIAGNOSIS — Z1329 Encounter for screening for other suspected endocrine disorder: Secondary | ICD-10-CM

## 2017-04-23 DIAGNOSIS — M799 Soft tissue disorder, unspecified: Secondary | ICD-10-CM

## 2017-04-23 DIAGNOSIS — Z113 Encounter for screening for infections with a predominantly sexual mode of transmission: Secondary | ICD-10-CM

## 2017-04-23 DIAGNOSIS — Z131 Encounter for screening for diabetes mellitus: Secondary | ICD-10-CM

## 2017-04-23 DIAGNOSIS — E669 Obesity, unspecified: Secondary | ICD-10-CM

## 2017-04-23 DIAGNOSIS — F329 Major depressive disorder, single episode, unspecified: Principal | ICD-10-CM

## 2017-04-23 MED ORDER — VENLAFAXINE 37.5 MG TABLET: tablet | 0 refills | 0 days | Status: AC

## 2017-04-23 MED ORDER — VENLAFAXINE 37.5 MG TABLET
ORAL_TABLET | 0 refills | 0.00000 days | Status: CP
Start: 2017-04-23 — End: 2017-05-21

## 2017-04-27 MED ORDER — LEVOTHYROXINE 50 MCG TABLET
ORAL_TABLET | Freq: Every day | ORAL | 0 refills | 0.00000 days | Status: CP
Start: 2017-04-27 — End: 2017-09-04

## 2017-04-30 ENCOUNTER — Ambulatory Visit: Admit: 2017-04-30 | Discharge: 2017-05-01 | Attending: Social Worker | Primary: Social Worker

## 2017-04-30 DIAGNOSIS — F329 Major depressive disorder, single episode, unspecified: Principal | ICD-10-CM

## 2017-05-21 ENCOUNTER — Ambulatory Visit: Admit: 2017-05-21 | Discharge: 2017-05-21 | Attending: Social Worker | Primary: Social Worker

## 2017-05-21 ENCOUNTER — Ambulatory Visit: Admit: 2017-05-21 | Discharge: 2017-05-21 | Attending: Internal Medicine | Primary: Internal Medicine

## 2017-05-21 DIAGNOSIS — E039 Hypothyroidism, unspecified: Secondary | ICD-10-CM

## 2017-05-21 DIAGNOSIS — G4700 Insomnia, unspecified: Secondary | ICD-10-CM

## 2017-05-21 DIAGNOSIS — F329 Major depressive disorder, single episode, unspecified: Secondary | ICD-10-CM

## 2017-05-21 DIAGNOSIS — E669 Obesity, unspecified: Secondary | ICD-10-CM

## 2017-05-21 DIAGNOSIS — R109 Unspecified abdominal pain: Secondary | ICD-10-CM

## 2017-05-21 DIAGNOSIS — F410 Panic disorder [episodic paroxysmal anxiety] without agoraphobia: Secondary | ICD-10-CM

## 2017-05-21 DIAGNOSIS — R110 Nausea: Secondary | ICD-10-CM

## 2017-05-21 DIAGNOSIS — Q796 Ehlers-Danlos syndrome, unspecified: Principal | ICD-10-CM

## 2017-05-21 MED ORDER — TRAZODONE 50 MG TABLET
ORAL_TABLET | Freq: Every evening | ORAL | 3 refills | 0.00000 days | Status: CP | PRN
Start: 2017-05-21 — End: 2017-07-09

## 2017-05-21 MED ORDER — PROMETHAZINE 25 MG TABLET
ORAL_TABLET | 3 refills | 0 days | Status: CP
Start: 2017-05-21 — End: 2018-04-29

## 2017-05-21 MED ORDER — VENLAFAXINE 75 MG TABLET
ORAL_TABLET | Freq: Two times a day (BID) | ORAL | 3 refills | 0 days | Status: CP
Start: 2017-05-21 — End: 2017-09-04

## 2017-05-31 ENCOUNTER — Ambulatory Visit: Admit: 2017-05-31 | Discharge: 2017-06-01 | Attending: Registered" | Primary: Registered"

## 2017-05-31 ENCOUNTER — Ambulatory Visit: Admit: 2017-05-31 | Discharge: 2017-06-01 | Attending: Social Worker | Primary: Social Worker

## 2017-05-31 DIAGNOSIS — Z6841 Body Mass Index (BMI) 40.0 and over, adult: Secondary | ICD-10-CM

## 2017-05-31 DIAGNOSIS — F329 Major depressive disorder, single episode, unspecified: Principal | ICD-10-CM

## 2017-06-01 ENCOUNTER — Ambulatory Visit: Admit: 2017-06-01 | Discharge: 2017-06-02 | Attending: Internal Medicine | Primary: Internal Medicine

## 2017-06-01 DIAGNOSIS — M793 Panniculitis, unspecified: Secondary | ICD-10-CM

## 2017-06-01 DIAGNOSIS — Z6841 Body Mass Index (BMI) 40.0 and over, adult: Secondary | ICD-10-CM

## 2017-06-01 MED ORDER — TOPIRAMATE 25 MG TABLET
ORAL_TABLET | 2 refills | 0 days | Status: CP
Start: 2017-06-01 — End: 2017-09-10

## 2017-06-06 ENCOUNTER — Ambulatory Visit: Admit: 2017-06-06 | Discharge: 2017-06-07

## 2017-06-06 DIAGNOSIS — M799 Soft tissue disorder, unspecified: Principal | ICD-10-CM

## 2017-06-20 ENCOUNTER — Ambulatory Visit: Admit: 2017-06-20 | Discharge: 2017-06-21 | Attending: Social Worker | Primary: Social Worker

## 2017-06-20 DIAGNOSIS — F329 Major depressive disorder, single episode, unspecified: Principal | ICD-10-CM

## 2017-06-22 ENCOUNTER — Ambulatory Visit: Admit: 2017-06-22 | Discharge: 2017-06-23 | Attending: Registered" | Primary: Registered"

## 2017-06-22 DIAGNOSIS — Z6841 Body Mass Index (BMI) 40.0 and over, adult: Secondary | ICD-10-CM

## 2017-07-04 ENCOUNTER — Ambulatory Visit: Admit: 2017-07-04 | Discharge: 2017-07-05 | Attending: Social Worker | Primary: Social Worker

## 2017-07-04 DIAGNOSIS — F329 Major depressive disorder, single episode, unspecified: Principal | ICD-10-CM

## 2017-07-09 ENCOUNTER — Ambulatory Visit: Admit: 2017-07-09 | Discharge: 2017-07-09 | Attending: Internal Medicine | Primary: Internal Medicine

## 2017-07-09 DIAGNOSIS — M793 Panniculitis, unspecified: Secondary | ICD-10-CM

## 2017-07-09 DIAGNOSIS — Q796 Ehlers-Danlos syndrome, unspecified: Secondary | ICD-10-CM

## 2017-07-09 DIAGNOSIS — Z6841 Body Mass Index (BMI) 40.0 and over, adult: Secondary | ICD-10-CM

## 2017-07-09 DIAGNOSIS — F419 Anxiety disorder, unspecified: Secondary | ICD-10-CM

## 2017-07-09 DIAGNOSIS — E039 Hypothyroidism, unspecified: Principal | ICD-10-CM

## 2017-07-09 DIAGNOSIS — K219 Gastro-esophageal reflux disease without esophagitis: Secondary | ICD-10-CM

## 2017-07-09 DIAGNOSIS — F329 Major depressive disorder, single episode, unspecified: Secondary | ICD-10-CM

## 2017-07-09 DIAGNOSIS — Z202 Contact with and (suspected) exposure to infections with a predominantly sexual mode of transmission: Secondary | ICD-10-CM

## 2017-07-09 DIAGNOSIS — F410 Panic disorder [episodic paroxysmal anxiety] without agoraphobia: Secondary | ICD-10-CM

## 2017-07-10 ENCOUNTER — Ambulatory Visit: Admit: 2017-07-10 | Discharge: 2017-07-11

## 2017-07-10 DIAGNOSIS — Z202 Contact with and (suspected) exposure to infections with a predominantly sexual mode of transmission: Principal | ICD-10-CM

## 2017-07-10 DIAGNOSIS — E039 Hypothyroidism, unspecified: Secondary | ICD-10-CM

## 2017-07-10 DIAGNOSIS — Z113 Encounter for screening for infections with a predominantly sexual mode of transmission: Secondary | ICD-10-CM

## 2017-07-11 MED ORDER — EMTRICITABINE 200 MG-TENOFOVIR DISOPROXIL FUMARATE 300 MG TABLET
ORAL_TABLET | Freq: Every day | ORAL | 11 refills | 0.00000 days | Status: CP
Start: 2017-07-11 — End: 2017-07-11

## 2017-07-17 ENCOUNTER — Ambulatory Visit: Admit: 2017-07-17 | Discharge: 2017-07-18

## 2017-07-17 DIAGNOSIS — M793 Panniculitis, unspecified: Principal | ICD-10-CM

## 2017-07-27 ENCOUNTER — Ambulatory Visit: Admit: 2017-07-27 | Discharge: 2017-07-28

## 2017-07-27 DIAGNOSIS — Z5189 Encounter for other specified aftercare: Principal | ICD-10-CM

## 2017-08-02 ENCOUNTER — Ambulatory Visit: Admit: 2017-08-02 | Discharge: 2017-08-03

## 2017-08-02 DIAGNOSIS — T8130XA Disruption of wound, unspecified, initial encounter: Principal | ICD-10-CM

## 2017-08-02 MED ORDER — CEPHALEXIN 500 MG CAPSULE
ORAL_CAPSULE | Freq: Two times a day (BID) | ORAL | 0 refills | 0 days | Status: CP
Start: 2017-08-02 — End: 2017-08-09

## 2017-08-23 ENCOUNTER — Encounter: Payer: Self-pay | Admitting: Emergency Medicine

## 2017-08-23 ENCOUNTER — Emergency Department
Admission: EM | Admit: 2017-08-23 | Discharge: 2017-08-23 | Disposition: A | Discharge status: Home / Self Care | Payer: Self-pay | Attending: Emergency Medicine | Admitting: Emergency Medicine

## 2017-08-23 ENCOUNTER — Emergency Department: Payer: Self-pay

## 2017-08-23 ENCOUNTER — Other Ambulatory Visit: Payer: Self-pay

## 2017-08-23 DIAGNOSIS — Y93E1 Activity, personal bathing and showering: Secondary | ICD-10-CM | POA: Insufficient documentation

## 2017-08-23 DIAGNOSIS — Y92002 Bathroom of unspecified non-institutional (private) residence single-family (private) house as the place of occurrence of the external cause: Secondary | ICD-10-CM | POA: Insufficient documentation

## 2017-08-23 DIAGNOSIS — Z79899 Other long term (current) drug therapy: Secondary | ICD-10-CM | POA: Insufficient documentation

## 2017-08-23 DIAGNOSIS — S39012A Strain of muscle, fascia and tendon of lower back, initial encounter: Secondary | ICD-10-CM | POA: Insufficient documentation

## 2017-08-23 DIAGNOSIS — Y999 Unspecified external cause status: Secondary | ICD-10-CM | POA: Insufficient documentation

## 2017-08-23 DIAGNOSIS — N3000 Acute cystitis without hematuria: Secondary | ICD-10-CM | POA: Insufficient documentation

## 2017-08-23 DIAGNOSIS — M5432 Sciatica, left side: Secondary | ICD-10-CM | POA: Insufficient documentation

## 2017-08-23 DIAGNOSIS — X501XXA Overexertion from prolonged static or awkward postures, initial encounter: Secondary | ICD-10-CM | POA: Insufficient documentation

## 2017-08-23 LAB — POCT PREGNANCY, URINE: Preg Test, Ur: NEGATIVE

## 2017-08-23 LAB — URINALYSIS, COMPLETE (UACMP) WITH MICROSCOPIC
Bilirubin Urine: NEGATIVE
GLUCOSE, UA: NEGATIVE mg/dL
HGB URINE DIPSTICK: NEGATIVE
KETONES UR: NEGATIVE mg/dL
NITRITE: NEGATIVE
PH: 5 (ref 5.0–8.0)
Protein, ur: NEGATIVE mg/dL
Specific Gravity, Urine: 1.024 (ref 1.005–1.030)

## 2017-08-23 MED ORDER — ORPHENADRINE CITRATE 30 MG/ML IJ SOLN
60.0000 mg | INTRAMUSCULAR | Status: AC
  Administered 2017-08-23: 60 mg via INTRAMUSCULAR
  Filled 2017-08-23: qty 2

## 2017-08-23 MED ORDER — KETOROLAC TROMETHAMINE 10 MG PO TABS: 10.0000 mg | ORAL_TABLET | Freq: Three times a day (TID) | ORAL | 0 refills | Status: DC

## 2017-08-23 MED ORDER — FLUCONAZOLE 150 MG PO TABS
150.0000 mg | ORAL_TABLET | Freq: Once | ORAL | 1 refills | Status: AC
Start: 2017-08-23 — End: 2017-08-23

## 2017-08-23 MED ORDER — CEPHALEXIN 500 MG PO CAPS: 500.0000 mg | ORAL_CAPSULE | Freq: Two times a day (BID) | ORAL | 0 refills | Status: DC

## 2017-08-23 MED ORDER — KETOROLAC TROMETHAMINE 30 MG/ML IJ SOLN
60.0000 mg | Freq: Once | INTRAMUSCULAR | Status: AC
  Administered 2017-08-23: 60 mg via INTRAMUSCULAR
  Filled 2017-08-23: qty 2

## 2017-08-23 MED ORDER — CYCLOBENZAPRINE HCL 10 MG PO TABS: 10.0000 mg | ORAL_TABLET | Freq: Three times a day (TID) | ORAL | 0 refills | Status: AC | PRN

## 2017-08-23 NOTE — ED Notes (Signed)
See triage note  Presents with lower back pain  States she was taking a shower, turned to wash and then felt the pain   States pain is moving into left leg

## 2017-08-23 NOTE — Discharge Instructions (Signed)
Your exam and x-ray are consistent with a lumbar strain. Your x-ray is consistent mild arthritis and degenerative disc disease. Take the prescription meds as directed. Apply ice or moist heat to reduce pain. Follow-up with your provider for ongoing symptoms.

## 2017-08-23 NOTE — ED Triage Notes (Signed)
Pt c/o L lower back pain that started suddenly today, pt states pain wraps around her hips and shoots down back of her L leg. Pt denies any known injury.
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
## 2017-09-04 MED ORDER — VENLAFAXINE 75 MG TABLET
ORAL_TABLET | Freq: Two times a day (BID) | ORAL | 4 refills | 90 days | Status: CP
Start: 2017-09-04 — End: 2018-08-26
  Filled 2017-09-06: qty 28, 14d supply, fill #0

## 2017-09-04 MED ORDER — OMEPRAZOLE 20 MG CAPSULE,DELAYED RELEASE
ORAL_CAPSULE | Freq: Every day | ORAL | 4 refills | 90 days | Status: CP
Start: 2017-09-04 — End: ?
  Filled 2017-09-06: qty 14, 14d supply, fill #0

## 2017-09-04 MED ORDER — SYNTHROID 50 MCG TABLET
ORAL_TABLET | Freq: Every day | ORAL | 4 refills | 0.00000 days | Status: CP
Start: 2017-09-04 — End: ?
  Filled 2017-09-06: qty 7, 14d supply, fill #0

## 2017-09-06 MED FILL — OMEPRAZOLE 20 MG CAPSULE,DELAYED RELEASE: 14 days supply | Qty: 14 | Fill #0 | Status: AC

## 2017-09-06 MED FILL — SYNTHROID 50 MCG TABLET: 14 days supply | Qty: 7 | Fill #0 | Status: AC

## 2017-09-06 MED FILL — VENLAFAXINE 75 MG TABLET: 14 days supply | Qty: 28 | Fill #0 | Status: AC

## 2017-09-10 MED ORDER — TOPIRAMATE 25 MG TABLET
ORAL_TABLET | 3 refills | 0 days | Status: CP
Start: 2017-09-10 — End: 2018-04-29
  Filled 2017-09-11: qty 60, 30d supply, fill #0

## 2017-09-11 MED FILL — TOPIRAMATE 25 MG TABLET: 30 days supply | Qty: 60 | Fill #0 | Status: AC

## 2017-09-17 ENCOUNTER — Other Ambulatory Visit: Admit: 2017-09-17 | Discharge: 2017-09-17

## 2017-09-17 ENCOUNTER — Ambulatory Visit: Admit: 2017-09-17 | Discharge: 2017-09-17 | Attending: Hematology & Oncology | Primary: Hematology & Oncology

## 2017-09-17 DIAGNOSIS — K631 Perforation of intestine (nontraumatic): Secondary | ICD-10-CM

## 2017-09-17 DIAGNOSIS — Q796 Ehlers-Danlos syndrome, unspecified: Principal | ICD-10-CM

## 2017-09-17 DIAGNOSIS — M2480 Other specific joint derangements of unspecified joint, not elsewhere classified: Secondary | ICD-10-CM

## 2017-09-20 DIAGNOSIS — N10 Acute tubulo-interstitial nephritis: Secondary | ICD-10-CM | POA: Insufficient documentation

## 2017-09-20 DIAGNOSIS — Z79899 Other long term (current) drug therapy: Secondary | ICD-10-CM | POA: Insufficient documentation

## 2017-09-20 DIAGNOSIS — F329 Major depressive disorder, single episode, unspecified: Secondary | ICD-10-CM | POA: Insufficient documentation

## 2017-09-20 DIAGNOSIS — F419 Anxiety disorder, unspecified: Secondary | ICD-10-CM | POA: Insufficient documentation

## 2017-09-20 LAB — URINALYSIS, COMPLETE (UACMP) WITH MICROSCOPIC
Bacteria, UA: NONE SEEN
Bilirubin Urine: NEGATIVE
Glucose, UA: NEGATIVE mg/dL
Hgb urine dipstick: NEGATIVE
Ketones, ur: NEGATIVE mg/dL
Nitrite: NEGATIVE
Protein, ur: NEGATIVE mg/dL
SPECIFIC GRAVITY, URINE: 1.021 (ref 1.005–1.030)
WBC, UA: >50 WBC/hpf — ABNORMAL HIGH (ref 0–5)
pH: 6 (ref 5.0–8.0)

## 2017-09-20 LAB — BASIC METABOLIC PANEL
ANION GAP: 6 (ref 5–15)
BUN: 12 mg/dL (ref 6–20)
CHLORIDE: 107 mmol/L (ref 98–111)
CO2: 24 mmol/L (ref 22–32)
Calcium: 8.9 mg/dL (ref 8.9–10.3)
Creatinine, Ser: 0.7 mg/dL (ref 0.44–1.00)
GFR calc non Af Amer: >60 mL/min (ref >60)
Glucose, Bld: 102 mg/dL — ABNORMAL HIGH (ref 70–99)
POTASSIUM: 3.4 mmol/L — AB (ref 3.5–5.1)
SODIUM: 137 mmol/L (ref 135–145)

## 2017-09-20 LAB — CBC
HEMATOCRIT: 41.4 % (ref 35.0–47.0)
Hemoglobin: 14.4 g/dL (ref 12.0–16.0)
MCH: 32.2 pg (ref 26.0–34.0)
MCHC: 34.7 g/dL (ref 32.0–36.0)
MCV: 92.7 fL (ref 80.0–100.0)
PLATELETS: 226 10*3/uL (ref 150–440)
RBC: 4.47 MIL/uL (ref 3.80–5.20)
RDW: 14.3 % (ref 11.5–14.5)
WBC: 9.4 10*3/uL (ref 3.6–11.0)

## 2017-09-20 LAB — POCT PREGNANCY, URINE: Preg Test, Ur: NEGATIVE

## 2017-09-20 NOTE — ED Triage Notes (Signed)
Patient c/o right flank pain radiating to abdomen, urinary frequency, dysuria, and nausea X 7 days.

## 2017-09-21 ENCOUNTER — Emergency Department
Admission: EM | Admit: 2017-09-21 | Discharge: 2017-09-21 | Disposition: A | Discharge status: Home / Self Care | Payer: Self-pay | Attending: Emergency Medicine | Admitting: Emergency Medicine

## 2017-09-21 DIAGNOSIS — N12 Tubulo-interstitial nephritis, not specified as acute or chronic: Secondary | ICD-10-CM

## 2017-09-21 LAB — CHLAMYDIA/NGC RT PCR (ARMC ONLY)
Chlamydia Tr: NOT DETECTED
N gonorrhoeae: NOT DETECTED

## 2017-09-21 MED ORDER — SODIUM CHLORIDE 0.9 % IV SOLN
1.0000 g | Freq: Once | INTRAVENOUS | Status: AC
  Administered 2017-09-21: 1 g via INTRAVENOUS
  Filled 2017-09-21: qty 10

## 2017-09-21 MED ORDER — SODIUM CHLORIDE 0.9 % IV BOLUS
1000.0000 mL | Freq: Once | INTRAVENOUS | Status: AC
  Administered 2017-09-21: 1000 mL via INTRAVENOUS

## 2017-09-21 MED ORDER — CIPROFLOXACIN HCL 500 MG PO TABS: 500.0000 mg | ORAL_TABLET | Freq: Two times a day (BID) | ORAL | 0 refills | Status: AC
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
## 2017-09-21 NOTE — Discharge Instructions (Signed)
It was a pleasure to take care of you today, and thank you for coming to our emergency department.  If you have any questions or concerns before leaving please ask the nurse to grab me and I'm more than happy to go through your aftercare instructions again.  If you were prescribed any opioid pain medication today such as Norco, Vicodin, Percocet, morphine, hydrocodone, or oxycodone please make sure you do not drive when you are taking this medication as it can alter your ability to drive safely.  If you have any concerns once you are home that you are not improving or are in fact getting worse before you can make it to your follow-up appointment, please do not hesitate to call 911 and come back for further evaluation.  Merrily BrittleNeil Joyce Heitman, MD  Results for orders placed or performed during the hospital encounter of 09/21/17  Urinalysis, Complete w Microscopic  Result Value Ref Range   Color, Urine YELLOW (A) YELLOW   APPearance HAZY (A) CLEAR   Specific Gravity, Urine 1.021 1.005 - 1.030   pH 6.0 5.0 - 8.0   Glucose, UA NEGATIVE NEGATIVE mg/dL   Hgb urine dipstick NEGATIVE NEGATIVE   Bilirubin Urine NEGATIVE NEGATIVE   Ketones, ur NEGATIVE NEGATIVE mg/dL   Protein, ur NEGATIVE NEGATIVE mg/dL   Nitrite NEGATIVE NEGATIVE   Leukocytes, UA LARGE (A) NEGATIVE   RBC / HPF 6-10 0 - 5 RBC/hpf   WBC, UA >50 (H) 0 - 5 WBC/hpf   Bacteria, UA NONE SEEN NONE SEEN   Squamous Epithelial / LPF 6-10 0 - 5   Mucus PRESENT    Non Squamous Epithelial PRESENT (A) NONE SEEN  Basic metabolic panel  Result Value Ref Range   Sodium 137 135 - 145 mmol/L   Potassium 3.4 (L) 3.5 - 5.1 mmol/L   Chloride 107 98 - 111 mmol/L   CO2 24 22 - 32 mmol/L   Glucose, Bld 102 (H) 70 - 99 mg/dL   BUN 12 6 - 20 mg/dL   Creatinine, Ser 2.220.70 0.44 - 1.00 mg/dL   Calcium 8.9 8.9 - 97.910.3 mg/dL   GFR calc non Af Amer >60 >60 mL/min   GFR calc Af Amer >60 >60 mL/min   Anion gap 6 5 - 15  CBC  Result Value Ref Range   WBC 9.4 3.6  - 11.0 K/uL   RBC 4.47 3.80 - 5.20 MIL/uL   Hemoglobin 14.4 12.0 - 16.0 g/dL   HCT 89.241.4 11.935.0 - 41.747.0 %   MCV 92.7 80.0 - 100.0 fL   MCH 32.2 26.0 - 34.0 pg   MCHC 34.7 32.0 - 36.0 g/dL   RDW 40.814.3 14.411.5 - 81.814.5 %   Platelets 226 150 - 440 K/uL  Pregnancy, urine POC  Result Value Ref Range   Preg Test, Ur NEGATIVE NEGATIVE   Dg Lumbar Spine Complete  Result Date: 08/23/2017 CLINICAL DATA:  Low back pain EXAM: LUMBAR SPINE - COMPLETE 4+ VIEW COMPARISON:  10/02/2016 pelvic radiograph FINDINGS: High density material within the colon. This partially obscures the spine on some of the views. Lumbar alignment is within normal limits. Mild degenerative changes at L3-L4 and L4-L5. Vertebral body heights are normal. IMPRESSION: 1. Degenerative changes L3 through L5.  No acute osseous abnormality 2. High density material within the colon, correlate for any recent history of oral contrast administration. Electronically Signed   By: Jasmine PangKim  Fujinaga M.D.   On: 08/23/2017 20:22

## 2017-09-23 LAB — URINE CULTURE

## 2017-09-27 ENCOUNTER — Emergency Department: Payer: Self-pay

## 2017-09-27 ENCOUNTER — Emergency Department
Admission: EM | Admit: 2017-09-27 | Discharge: 2017-09-27 | Disposition: A | Discharge status: Home / Self Care | Payer: Self-pay | Attending: Emergency Medicine | Admitting: Emergency Medicine

## 2017-09-27 ENCOUNTER — Other Ambulatory Visit: Payer: Self-pay

## 2017-09-27 ENCOUNTER — Encounter: Payer: Self-pay | Admitting: Emergency Medicine

## 2017-09-27 DIAGNOSIS — Y92512 Supermarket, store or market as the place of occurrence of the external cause: Secondary | ICD-10-CM | POA: Insufficient documentation

## 2017-09-27 DIAGNOSIS — Y999 Unspecified external cause status: Secondary | ICD-10-CM | POA: Insufficient documentation

## 2017-09-27 DIAGNOSIS — W19XXXA Unspecified fall, initial encounter: Secondary | ICD-10-CM

## 2017-09-27 DIAGNOSIS — S8002XA Contusion of left knee, initial encounter: Secondary | ICD-10-CM | POA: Insufficient documentation

## 2017-09-27 DIAGNOSIS — W010XXA Fall on same level from slipping, tripping and stumbling without subsequent striking against object, initial encounter: Secondary | ICD-10-CM | POA: Insufficient documentation

## 2017-09-27 DIAGNOSIS — Y9389 Activity, other specified: Secondary | ICD-10-CM | POA: Insufficient documentation

## 2017-09-27 DIAGNOSIS — Z79899 Other long term (current) drug therapy: Secondary | ICD-10-CM | POA: Insufficient documentation

## 2017-09-27 MED ORDER — HYDROCODONE-ACETAMINOPHEN 5-325 MG PO TABS
1.0000 | ORAL_TABLET | Freq: Once | ORAL | Status: AC
  Administered 2017-09-27: 1 via ORAL
  Filled 2017-09-27: qty 1
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
## 2017-09-27 NOTE — Discharge Instructions (Addendum)
Follow up with your regular doctor or dr Rosita Kea if not better in 5-7 days.  Take tylenol or ibuprofen for pain as needed.  Apply ice to any areas that hurt.

## 2017-09-27 NOTE — ED Notes (Signed)
See triage note  States she tripped on rug at [employer]  Landed on left knee  No swelling noted  Unable to bear wt

## 2017-09-27 NOTE — ED Triage Notes (Signed)
Pt tripped and fell over rug in store. C/o left knee pain. Thinks left knee dislocated and then popped back in. 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  NAD.  Appears swollen but does not appear dislocated at this time.

## 2017-10-04 MED FILL — OMEPRAZOLE 20 MG CAPSULE,DELAYED RELEASE: ORAL | 90 days supply | Qty: 90 | Fill #1

## 2017-10-04 MED FILL — SYNTHROID 50 MCG TABLET: 90 days supply | Qty: 45 | Fill #1 | Status: AC

## 2017-10-04 MED FILL — VENLAFAXINE 75 MG TABLET: 90 days supply | Qty: 180 | Fill #1 | Status: AC

## 2017-10-04 MED FILL — VENLAFAXINE 75 MG TABLET: ORAL | 90 days supply | Qty: 180 | Fill #1

## 2017-10-04 MED FILL — SYNTHROID 50 MCG TABLET: ORAL | 90 days supply | Qty: 45 | Fill #1

## 2017-10-04 MED FILL — OMEPRAZOLE 20 MG CAPSULE,DELAYED RELEASE: 90 days supply | Qty: 90 | Fill #1 | Status: AC

## 2017-10-08 ENCOUNTER — Ambulatory Visit: Admit: 2017-10-08 | Discharge: 2017-10-09 | Attending: Internal Medicine | Primary: Internal Medicine

## 2017-10-08 DIAGNOSIS — F410 Panic disorder [episodic paroxysmal anxiety] without agoraphobia: Secondary | ICD-10-CM

## 2017-10-08 DIAGNOSIS — F419 Anxiety disorder, unspecified: Secondary | ICD-10-CM

## 2017-10-08 DIAGNOSIS — Q796 Ehlers-Danlos syndrome, unspecified: Secondary | ICD-10-CM

## 2017-10-08 DIAGNOSIS — M25569 Pain in unspecified knee: Principal | ICD-10-CM

## 2017-10-08 DIAGNOSIS — M799 Soft tissue disorder, unspecified: Secondary | ICD-10-CM

## 2017-10-08 DIAGNOSIS — Z9189 Other specified personal risk factors, not elsewhere classified: Secondary | ICD-10-CM

## 2017-10-08 DIAGNOSIS — F329 Major depressive disorder, single episode, unspecified: Secondary | ICD-10-CM

## 2017-10-08 MED ORDER — LIDOCAINE 5 % TOPICAL PATCH
MEDICATED_PATCH | Freq: Two times a day (BID) | TRANSDERMAL | 1 refills | 0.00000 days | Status: CP
Start: 2017-10-08 — End: 2018-04-29

## 2017-10-08 MED ORDER — DICLOFENAC 1 % TOPICAL GEL
Freq: Four times a day (QID) | TOPICAL | 2 refills | 0.00000 days | Status: CP
Start: 2017-10-08 — End: 2018-07-22
  Filled 2017-10-08: qty 100, 13d supply, fill #0

## 2017-10-08 MED ORDER — ONDANSETRON HCL 4 MG TABLET
ORAL_TABLET | Freq: Every day | ORAL | 3 refills | 0 days | Status: CP | PRN
Start: 2017-10-08 — End: 2018-02-25
  Filled 2017-10-08: qty 30, 30d supply, fill #0

## 2017-10-08 MED ORDER — EMTRICITABINE 200 MG-TENOFOVIR DISOPROXIL FUMARATE 300 MG TABLET
ORAL_TABLET | Freq: Every day | ORAL | 3 refills | 90.00000 days | Status: CP
Start: 2017-10-08 — End: 2018-10-09
  Filled 2017-10-08: qty 30, 30d supply, fill #0

## 2017-10-08 MED FILL — ONDANSETRON HCL 4 MG TABLET: 30 days supply | Qty: 30 | Fill #0 | Status: AC

## 2017-10-08 MED FILL — DICLOFENAC 1 % TOPICAL GEL: 13 days supply | Qty: 100 | Fill #0 | Status: AC

## 2017-10-08 MED FILL — TRUVADA 200 MG-300 MG TABLET: 30 days supply | Qty: 30 | Fill #0 | Status: AC

## 2017-10-23 MED FILL — TOPIRAMATE 25 MG TABLET: 30 days supply | Qty: 60 | Fill #1

## 2017-10-23 MED FILL — TOPIRAMATE 25 MG TABLET: 30 days supply | Qty: 60 | Fill #1 | Status: AC

## 2017-11-02 ENCOUNTER — Ambulatory Visit: Admit: 2017-11-02 | Discharge: 2017-11-03 | Attending: Internal Medicine | Primary: Internal Medicine

## 2017-11-02 DIAGNOSIS — Z6841 Body Mass Index (BMI) 40.0 and over, adult: Secondary | ICD-10-CM

## 2017-11-02 MED ORDER — PHENTERMINE 8 MG TABLET
ORAL_TABLET | Freq: Every day | ORAL | 0 refills | 0 days | Status: CP
Start: 2017-11-02 — End: 2017-11-05

## 2017-11-05 MED ORDER — PHENTERMINE 8 MG TABLET
ORAL_TABLET | Freq: Every day | ORAL | 0 refills | 0.00000 days | Status: CP
Start: 2017-11-05 — End: 2018-04-29

## 2017-11-05 MED FILL — TRUVADA 200 MG-300 MG TABLET: 30 days supply | Qty: 30 | Fill #1 | Status: AC

## 2017-11-05 MED FILL — TRUVADA 200 MG-300 MG TABLET: ORAL | 30 days supply | Qty: 30 | Fill #1

## 2017-11-05 MED FILL — ONDANSETRON HCL 4 MG TABLET: ORAL | 30 days supply | Qty: 30 | Fill #1

## 2017-11-05 MED FILL — ONDANSETRON HCL 4 MG TABLET: 30 days supply | Qty: 30 | Fill #1 | Status: AC

## 2017-12-03 MED FILL — ONDANSETRON HCL 4 MG TABLET: 30 days supply | Qty: 30 | Fill #2 | Status: AC

## 2017-12-03 MED FILL — ONDANSETRON HCL 4 MG TABLET: ORAL | 30 days supply | Qty: 30 | Fill #2

## 2017-12-03 MED FILL — TRUVADA 200 MG-300 MG TABLET: ORAL | 30 days supply | Qty: 30 | Fill #2

## 2017-12-03 MED FILL — TRUVADA 200 MG-300 MG TABLET: 30 days supply | Qty: 30 | Fill #2 | Status: AC

## 2017-12-06 ENCOUNTER — Ambulatory Visit: Admit: 2017-12-06 | Discharge: 2017-12-07 | Attending: Internal Medicine | Primary: Internal Medicine

## 2017-12-06 DIAGNOSIS — M7989 Other specified soft tissue disorders: Principal | ICD-10-CM

## 2017-12-06 MED ORDER — DOXYCYCLINE HYCLATE 100 MG TABLET
ORAL_TABLET | Freq: Two times a day (BID) | ORAL | 0 refills | 0 days | Status: CP
Start: 2017-12-06 — End: 2017-12-13

## 2018-01-08 MED FILL — OMEPRAZOLE 20 MG CAPSULE,DELAYED RELEASE: 90 days supply | Qty: 90 | Fill #2 | Status: AC

## 2018-01-08 MED FILL — DICLOFENAC 1 % TOPICAL GEL: 13 days supply | Qty: 100 | Fill #1 | Status: AC

## 2018-01-08 MED FILL — VENLAFAXINE 75 MG TABLET: ORAL | 90 days supply | Qty: 180 | Fill #2

## 2018-01-08 MED FILL — TOPIRAMATE 25 MG TABLET: 30 days supply | Qty: 60 | Fill #2

## 2018-01-08 MED FILL — TOPIRAMATE 25 MG TABLET: 30 days supply | Qty: 60 | Fill #2 | Status: AC

## 2018-01-08 MED FILL — DICLOFENAC 1 % TOPICAL GEL: TOPICAL | 13 days supply | Qty: 100 | Fill #1

## 2018-01-08 MED FILL — OMEPRAZOLE 20 MG CAPSULE,DELAYED RELEASE: ORAL | 90 days supply | Qty: 90 | Fill #2

## 2018-01-08 MED FILL — TRUVADA 200 MG-300 MG TABLET: ORAL | 30 days supply | Qty: 30 | Fill #3

## 2018-01-08 MED FILL — SYNTHROID 50 MCG TABLET: 90 days supply | Qty: 45 | Fill #2 | Status: AC

## 2018-01-08 MED FILL — SYNTHROID 50 MCG TABLET: ORAL | 90 days supply | Qty: 45 | Fill #2

## 2018-01-08 MED FILL — ONDANSETRON HCL 4 MG TABLET: 30 days supply | Qty: 30 | Fill #3 | Status: AC

## 2018-01-08 MED FILL — TRUVADA 200 MG-300 MG TABLET: 30 days supply | Qty: 30 | Fill #3 | Status: AC

## 2018-01-08 MED FILL — VENLAFAXINE 75 MG TABLET: 90 days supply | Qty: 180 | Fill #2 | Status: AC

## 2018-01-08 MED FILL — ONDANSETRON HCL 4 MG TABLET: ORAL | 30 days supply | Qty: 30 | Fill #3

## 2018-01-28 IMAGING — US US EXTREM LOW VENOUS*R*
1 series · 13 of 24 positions shown · non-contrast
Comparison: 04/05/2010

CLINICAL DATA: Right lower extremity swelling and redness.



[Series 1: us extrem low venous*right* · 0.07mm/px · 13 of 53 slices shown]
[im 1/53]
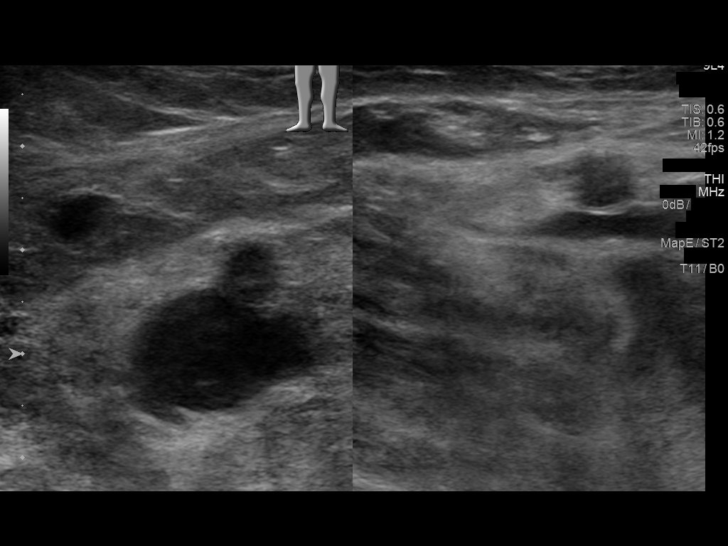
[im 5/53]
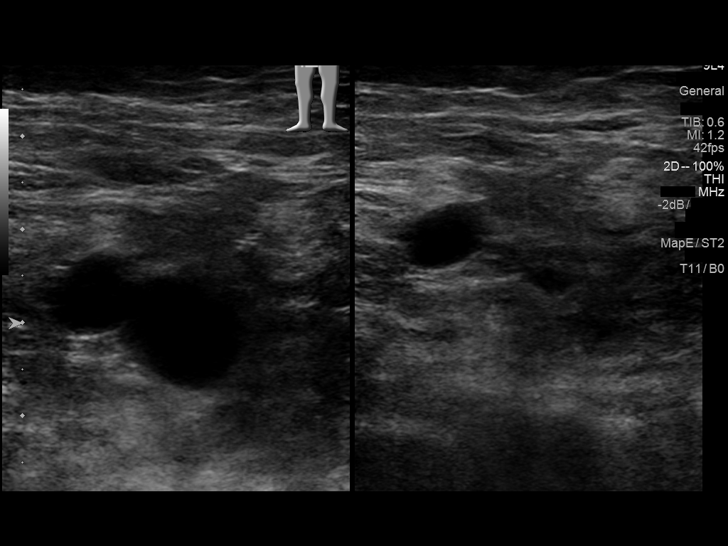
[im 10/53]
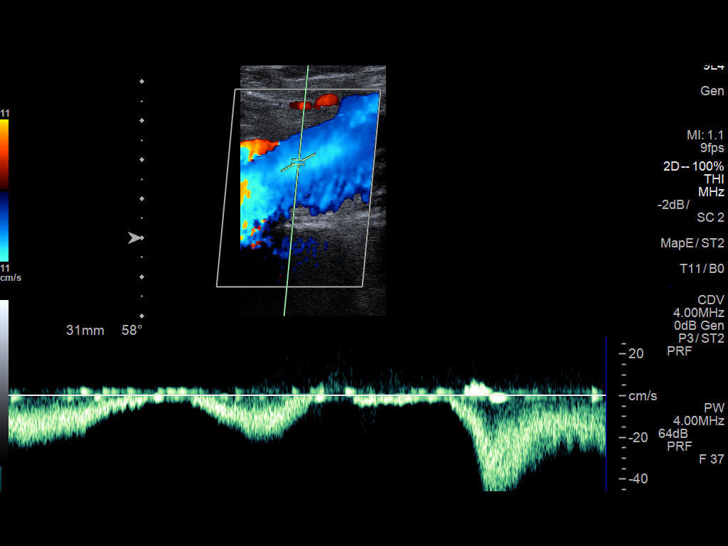
[im 14/53]
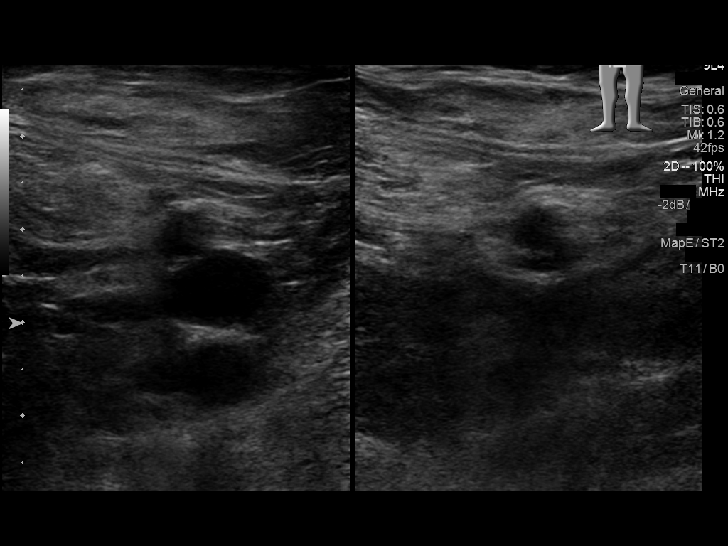
[im 19/53]
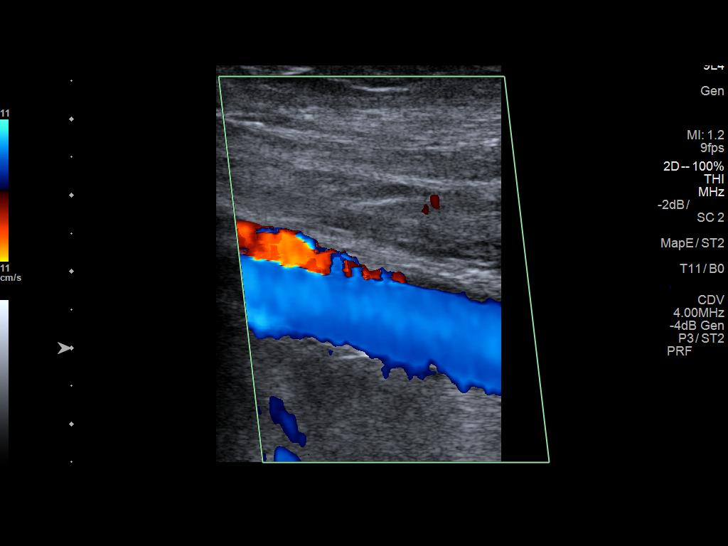
[im 23/53]
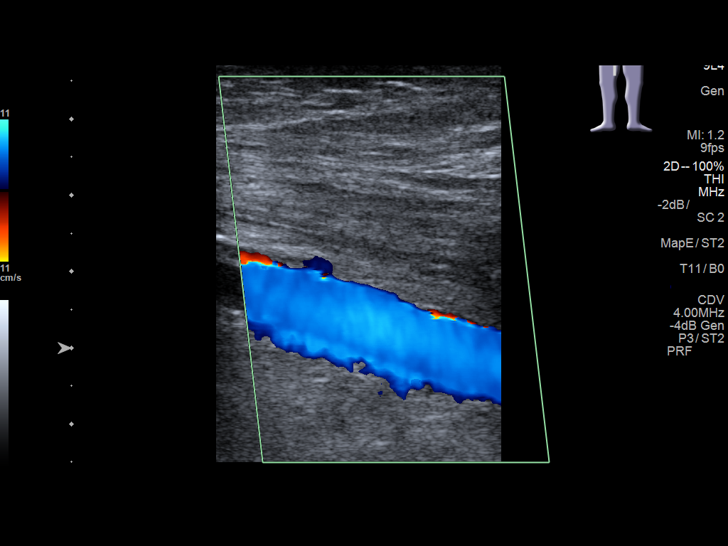
[im 28/53]
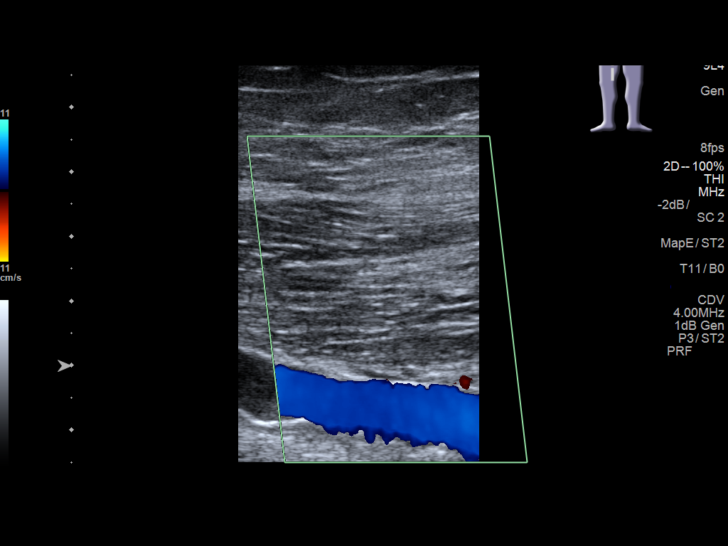
[im 30/53]
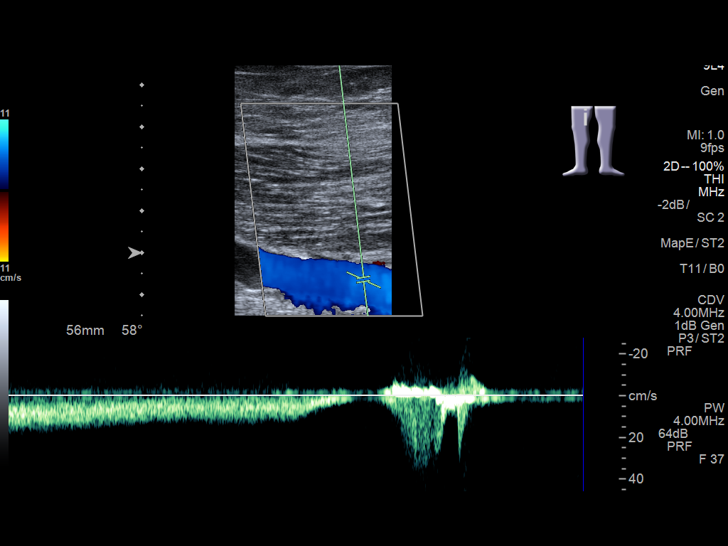
[im 34/53]
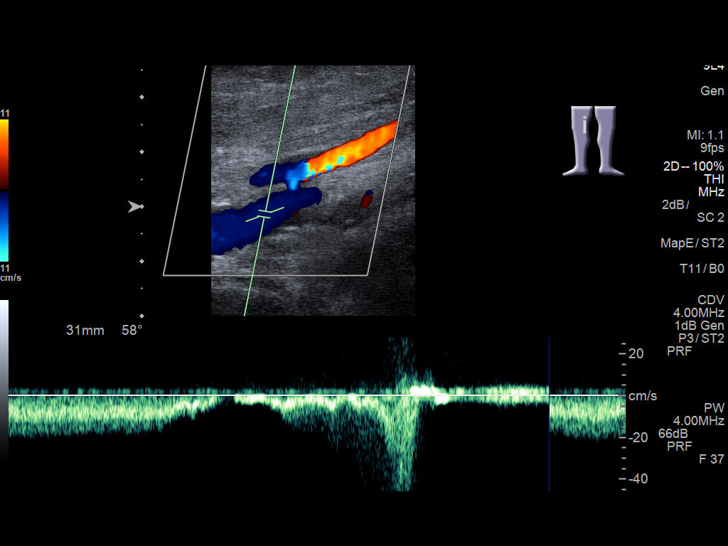
[im 39/53]
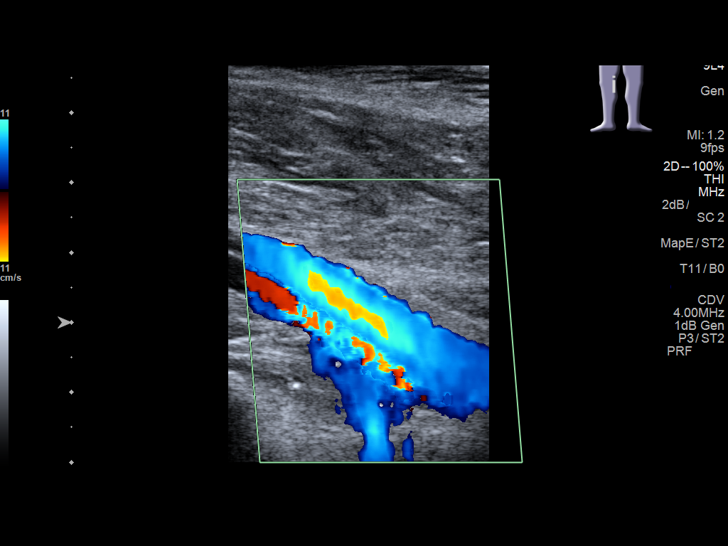
[im 43/53]
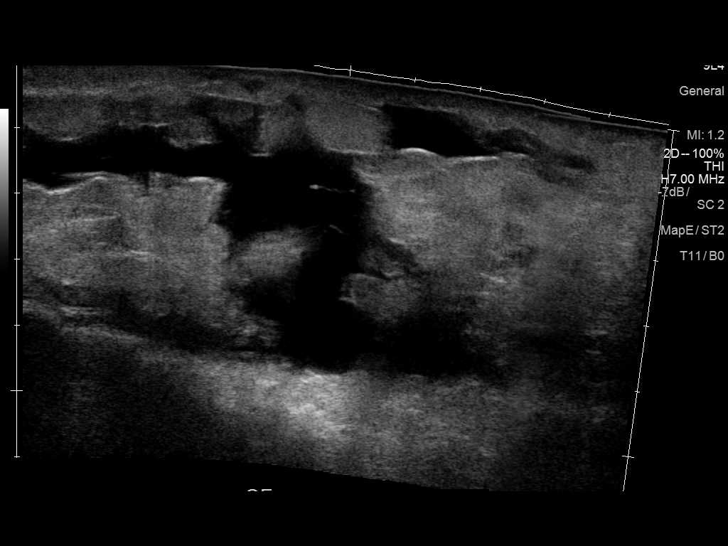
[im 48/53]
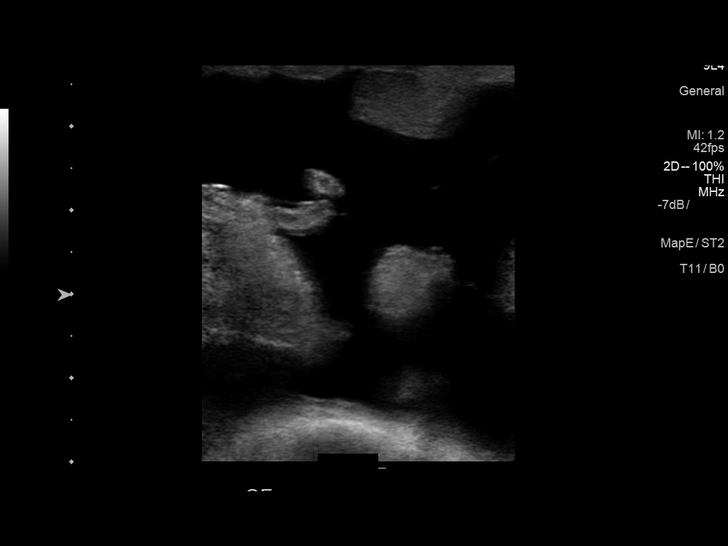
[im 53/53]
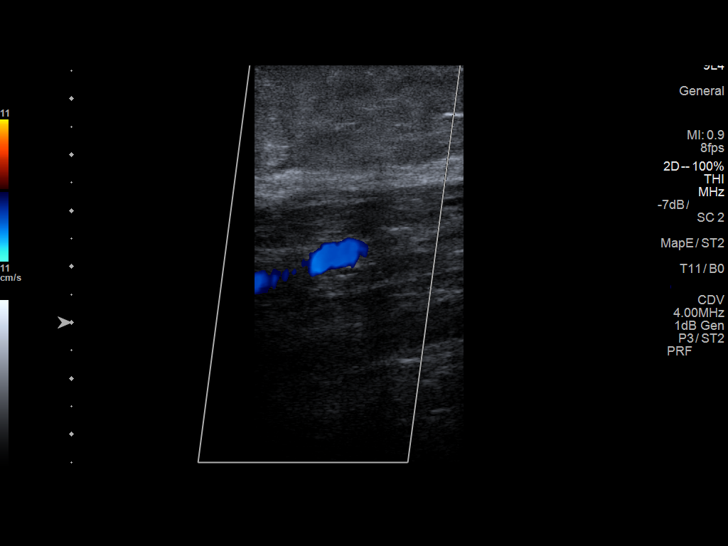

[13 of 24 positions shown; findings below may reference images not displayed]

FINDINGS: Contralateral Common Femoral Vein: Respiratory phasicity is normal
and symmetric with the symptomatic side. No evidence of thrombus.
Normal compressibility.

Common Femoral Vein: No evidence of thrombus. Normal
compressibility, respiratory phasicity and response to augmentation.

Saphenofemoral Junction: No evidence of thrombus. Normal
compressibility and flow on color Doppler imaging.

Profunda Femoral Vein: No evidence of thrombus. Normal
compressibility and flow on color Doppler imaging.

Femoral Vein: No evidence of thrombus. Normal compressibility,
respiratory phasicity and response to augmentation.

Popliteal Vein: No evidence of thrombus. Normal compressibility,
respiratory phasicity and response to augmentation.

Calf Veins: No evidence of thrombus. Normal compressibility and flow
on color Doppler imaging.

Superficial Great Saphenous Vein: No evidence of thrombus. Normal
compressibility and flow on color Doppler imaging.

Venous Reflux:  None.

Other Findings: Within the area of redness and swelling in the right
anterior upper calf, there is a subcutaneous edema with large
pockets of fluid seen in the subcutaneous tissue and along the
fascial planes.
IMPRESSION: No evidence of deep venous thrombosis.

Right anterior calf subcutaneous edema with large pockets of fluid
in the subcutaneous tissue and along the fascial planes. This may
represent posttraumatic changes or, in the absence of history of
trauma, may be seen with cellulitis/fasciitis.

## 2018-02-18 MED FILL — TOPIRAMATE 25 MG TABLET: 30 days supply | Qty: 60 | Fill #3 | Status: AC

## 2018-02-18 MED FILL — TOPIRAMATE 25 MG TABLET: 30 days supply | Qty: 60 | Fill #3

## 2018-02-18 MED FILL — TRUVADA 200 MG-300 MG TABLET: 30 days supply | Qty: 30 | Fill #4 | Status: AC

## 2018-02-18 MED FILL — TRUVADA 200 MG-300 MG TABLET: ORAL | 30 days supply | Qty: 30 | Fill #4

## 2018-02-20 ENCOUNTER — Ambulatory Visit: Admit: 2018-02-20 | Discharge: 2018-02-20

## 2018-02-20 ENCOUNTER — Ambulatory Visit: Admit: 2018-02-20 | Discharge: 2018-02-21 | Attending: Internal Medicine | Primary: Internal Medicine

## 2018-02-20 DIAGNOSIS — M2550 Pain in unspecified joint: Secondary | ICD-10-CM

## 2018-02-20 DIAGNOSIS — M1990 Unspecified osteoarthritis, unspecified site: Principal | ICD-10-CM

## 2018-02-25 MED ORDER — ONDANSETRON HCL 4 MG TABLET
ORAL_TABLET | Freq: Every day | ORAL | 3 refills | 0.00000 days | Status: CP | PRN
Start: 2018-02-25 — End: 2019-02-25
  Filled 2018-03-13: qty 90, 90d supply, fill #0

## 2018-03-13 MED FILL — ONDANSETRON HCL 4 MG TABLET: 90 days supply | Qty: 90 | Fill #0 | Status: AC

## 2018-03-20 MED FILL — TRUVADA 200 MG-300 MG TABLET: ORAL | 30 days supply | Qty: 30 | Fill #5

## 2018-03-20 MED FILL — TRUVADA 200 MG-300 MG TABLET: 30 days supply | Qty: 30 | Fill #5 | Status: AC

## 2018-03-28 IMAGING — US US EXTREM LOW*L* LIMITED
1 series · 10 of 10 positions shown · non-contrast
Comparison: None available.

CLINICAL DATA: 40 y/o F; possible abscess in the posterior left
thigh.

EXAM:
ULTRASOUND LEFT LOWER EXTREMITY LIMITED
TECHNIQUE: Ultrasound examination of the lower extremity soft tissues was
performed in the area of clinical concern.

[Series 1: us extrem low*left* limited · 0.07mm/px · 10 of 10 slices shown]
[im 1/10]
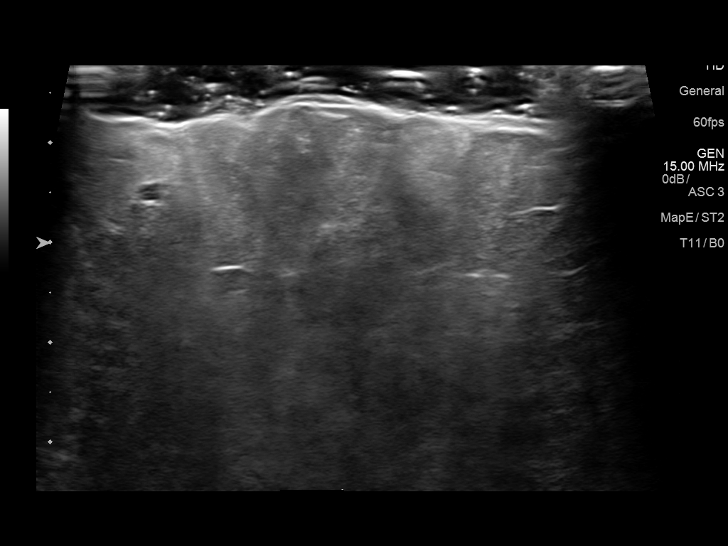
[im 2/10]
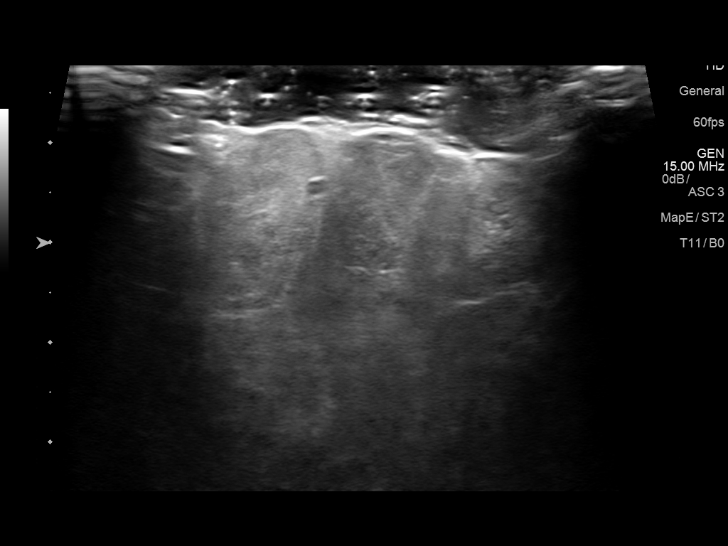
[im 3/10]
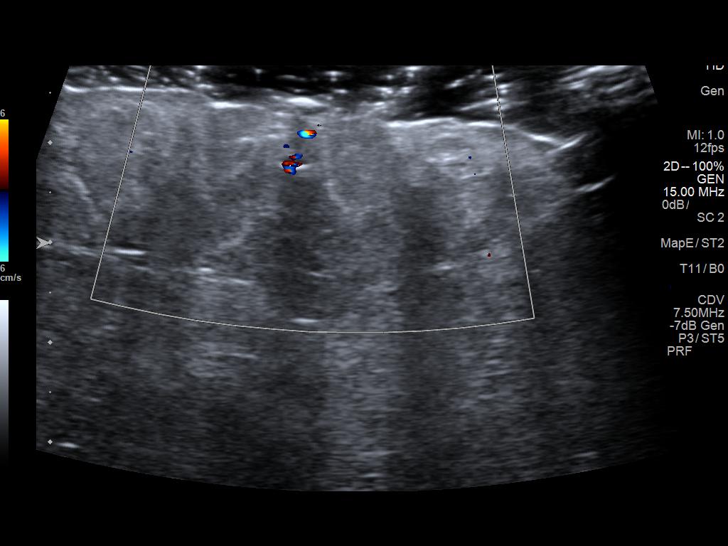
[im 4/10]
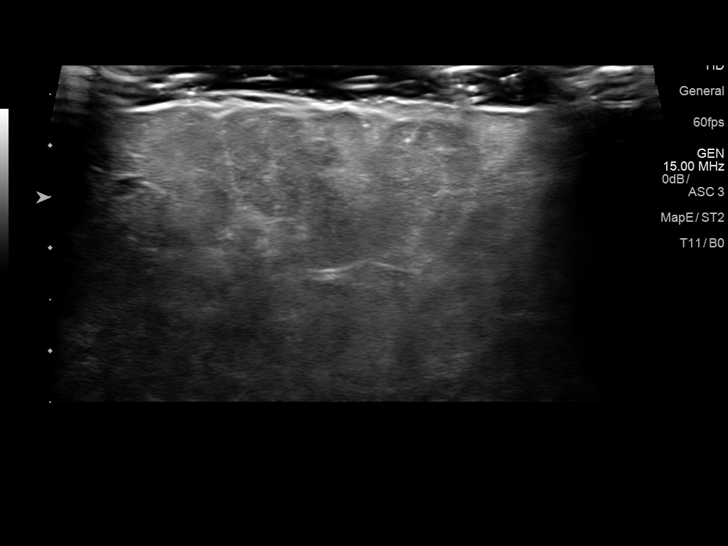
[im 5/10]
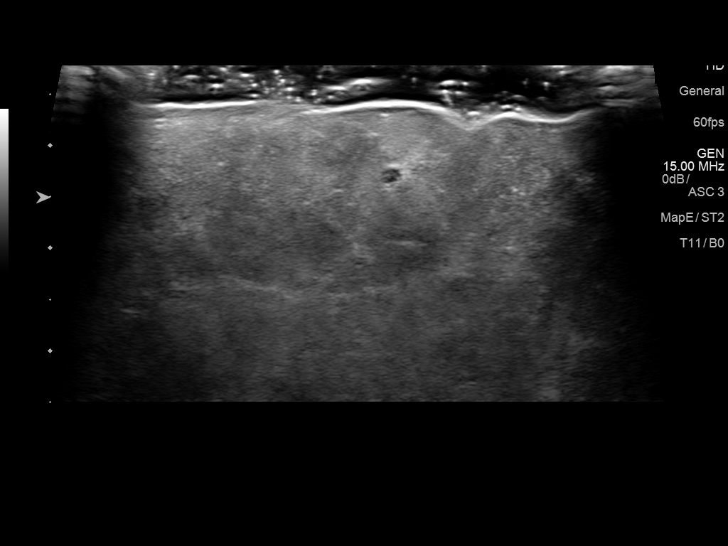
[im 6/10]
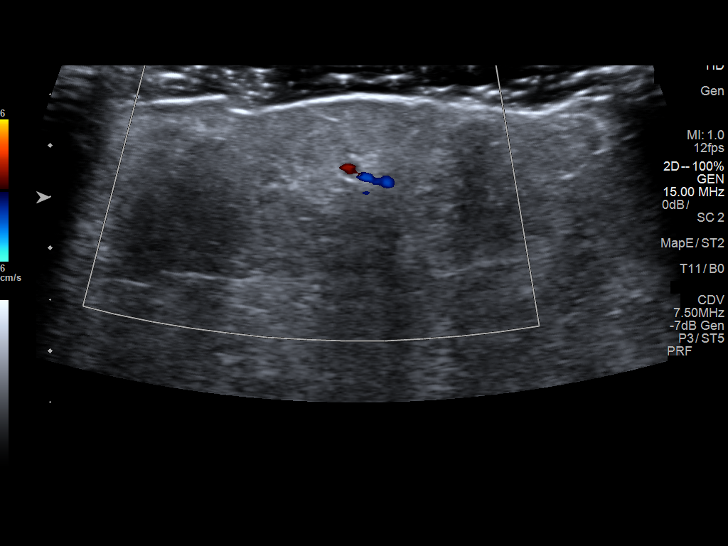
[im 7/10]
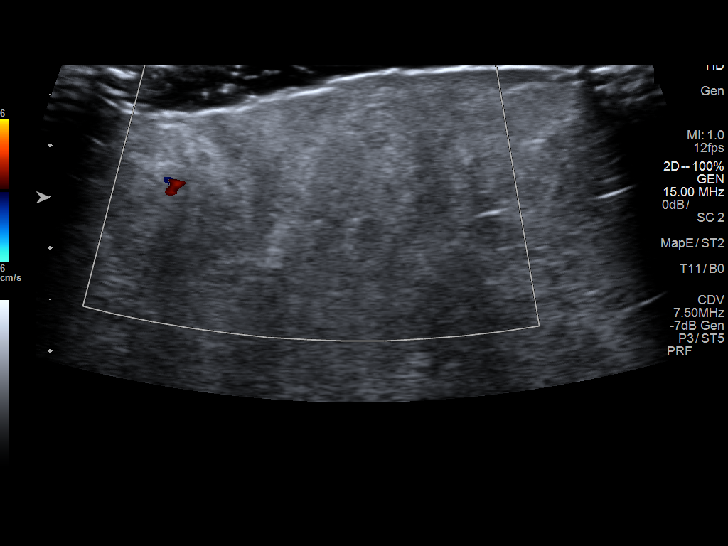
[im 8/10]
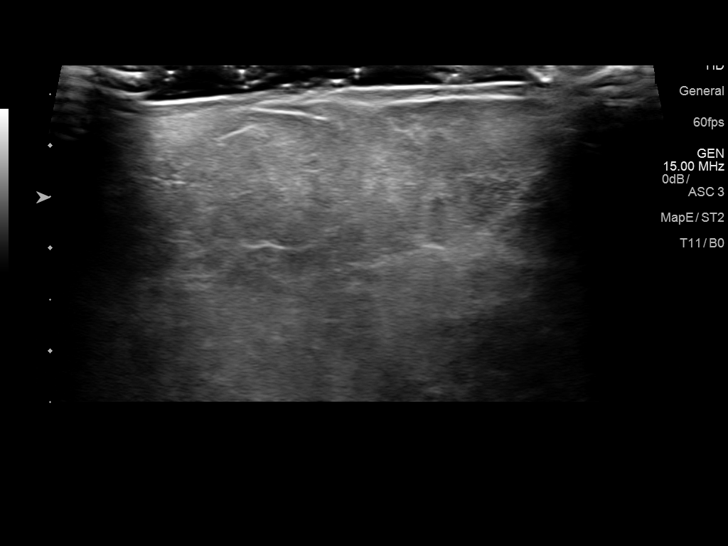
[im 9/10]
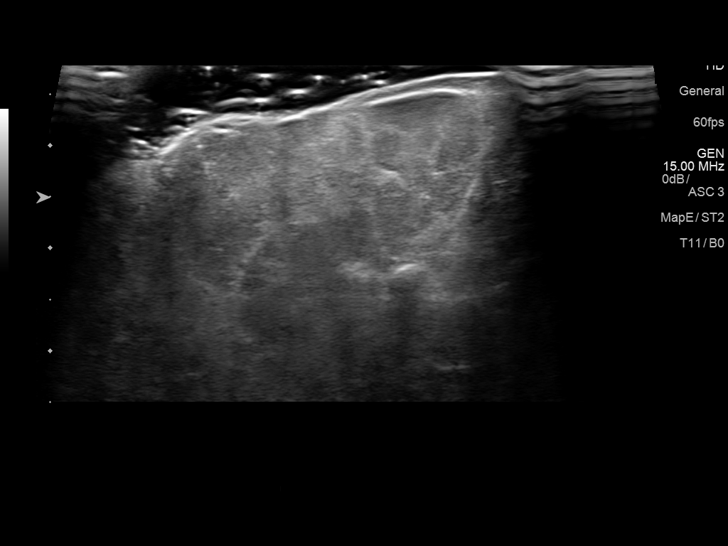
[im 10/10]
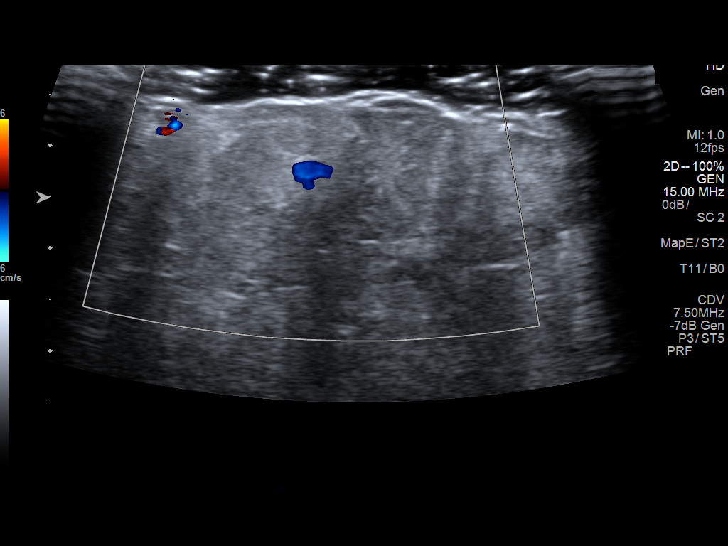

[10 of 10 positions shown; findings below may reference images not displayed]

FINDINGS: In the area of interest in the left posterior thigh no mass or fluid
collection is identified within the subcutaneous tissues.
IMPRESSION: No evidence of mass or abscess.

By: Herik Cut M.D.

## 2018-04-05 ENCOUNTER — Ambulatory Visit: Admit: 2018-04-05 | Discharge: 2018-04-05

## 2018-04-05 DIAGNOSIS — K219 Gastro-esophageal reflux disease without esophagitis: Principal | ICD-10-CM

## 2018-04-05 DIAGNOSIS — Q796 Ehlers-Danlos syndrome, unspecified: Principal | ICD-10-CM

## 2018-04-05 DIAGNOSIS — M064 Inflammatory polyarthropathy: Principal | ICD-10-CM

## 2018-04-05 DIAGNOSIS — M659 Synovitis and tenosynovitis, unspecified: Principal | ICD-10-CM

## 2018-04-05 DIAGNOSIS — I7300 Raynaud's syndrome without gangrene: Principal | ICD-10-CM

## 2018-04-05 DIAGNOSIS — Z932 Ileostomy status: Principal | ICD-10-CM

## 2018-04-05 DIAGNOSIS — Z79899 Other long term (current) drug therapy: Principal | ICD-10-CM

## 2018-04-05 DIAGNOSIS — E669 Obesity, unspecified: Principal | ICD-10-CM

## 2018-04-05 DIAGNOSIS — F329 Major depressive disorder, single episode, unspecified: Principal | ICD-10-CM

## 2018-04-05 DIAGNOSIS — Z885 Allergy status to narcotic agent status: Principal | ICD-10-CM

## 2018-04-05 DIAGNOSIS — M19071 Primary osteoarthritis, right ankle and foot: Principal | ICD-10-CM

## 2018-04-05 DIAGNOSIS — M1990 Unspecified osteoarthritis, unspecified site: Principal | ICD-10-CM

## 2018-04-05 DIAGNOSIS — Z598 Other problems related to housing and economic circumstances: Principal | ICD-10-CM

## 2018-04-05 DIAGNOSIS — F419 Anxiety disorder, unspecified: Principal | ICD-10-CM

## 2018-04-05 DIAGNOSIS — E079 Disorder of thyroid, unspecified: Principal | ICD-10-CM

## 2018-04-05 DIAGNOSIS — M2550 Pain in unspecified joint: Principal | ICD-10-CM

## 2018-04-05 DIAGNOSIS — M19072 Primary osteoarthritis, left ankle and foot: Principal | ICD-10-CM

## 2018-04-05 DIAGNOSIS — H547 Unspecified visual loss: Principal | ICD-10-CM

## 2018-04-05 DIAGNOSIS — F410 Panic disorder [episodic paroxysmal anxiety] without agoraphobia: Principal | ICD-10-CM

## 2018-04-05 DIAGNOSIS — M359 Systemic involvement of connective tissue, unspecified: Principal | ICD-10-CM

## 2018-04-05 DIAGNOSIS — Z9189 Other specified personal risk factors, not elsewhere classified: Principal | ICD-10-CM

## 2018-04-05 MED ORDER — PREDNISONE 5 MG TABLET
ORAL_TABLET | 0 refills | 0 days | Status: CP
Start: 2018-04-05 — End: 2018-05-02
  Filled 2018-04-08: qty 42, 21d supply, fill #0

## 2018-04-05 MED ORDER — METHOTREXATE SODIUM 2.5 MG TABLET
ORAL_TABLET | ORAL | 4 refills | 0 days | Status: CP
Start: 2018-04-05 — End: 2018-07-22
  Filled 2018-04-08: qty 24, 28d supply, fill #0

## 2018-04-05 MED ORDER — FOLIC ACID 1 MG TABLET
ORAL_TABLET | Freq: Every day | ORAL | 3 refills | 0 days | Status: CP
Start: 2018-04-05 — End: ?
  Filled 2018-04-08: qty 90, 90d supply, fill #0

## 2018-04-08 MED FILL — FOLIC ACID 1 MG TABLET: 90 days supply | Qty: 90 | Fill #0 | Status: AC

## 2018-04-08 MED FILL — PREDNISONE 5 MG TABLET: 21 days supply | Qty: 42 | Fill #0 | Status: AC

## 2018-04-08 MED FILL — METHOTREXATE SODIUM 2.5 MG TABLET: 28 days supply | Qty: 24 | Fill #0 | Status: AC

## 2018-04-22 MED FILL — SYNTHROID 50 MCG TABLET: 90 days supply | Qty: 45 | Fill #3 | Status: AC

## 2018-04-22 MED FILL — OMEPRAZOLE 20 MG CAPSULE,DELAYED RELEASE: 90 days supply | Qty: 90 | Fill #3 | Status: AC

## 2018-04-22 MED FILL — VENLAFAXINE 75 MG TABLET: ORAL | 90 days supply | Qty: 180 | Fill #3

## 2018-04-22 MED FILL — OMEPRAZOLE 20 MG CAPSULE,DELAYED RELEASE: ORAL | 90 days supply | Qty: 90 | Fill #3

## 2018-04-22 MED FILL — SYNTHROID 50 MCG TABLET: ORAL | 90 days supply | Qty: 45 | Fill #3

## 2018-04-22 MED FILL — VENLAFAXINE 75 MG TABLET: 90 days supply | Qty: 180 | Fill #3 | Status: AC

## 2018-04-22 MED FILL — TRUVADA 200 MG-300 MG TABLET: 30 days supply | Qty: 30 | Fill #6 | Status: AC

## 2018-04-22 MED FILL — TRUVADA 200 MG-300 MG TABLET: ORAL | 30 days supply | Qty: 30 | Fill #6

## 2018-04-29 ENCOUNTER — Ambulatory Visit: Admit: 2018-04-29 | Discharge: 2018-04-30 | Attending: Internal Medicine | Primary: Internal Medicine

## 2018-04-29 DIAGNOSIS — R1011 Right upper quadrant pain: Secondary | ICD-10-CM

## 2018-04-29 DIAGNOSIS — L02611 Cutaneous abscess of right foot: Secondary | ICD-10-CM

## 2018-04-29 DIAGNOSIS — M2550 Pain in unspecified joint: Secondary | ICD-10-CM

## 2018-04-29 DIAGNOSIS — Z79899 Other long term (current) drug therapy: Secondary | ICD-10-CM

## 2018-04-29 DIAGNOSIS — N926 Irregular menstruation, unspecified: Secondary | ICD-10-CM

## 2018-04-29 DIAGNOSIS — L97509 Non-pressure chronic ulcer of other part of unspecified foot with unspecified severity: Principal | ICD-10-CM

## 2018-04-29 MED ORDER — DOXYCYCLINE HYCLATE 100 MG CAPSULE
ORAL_CAPSULE | Freq: Two times a day (BID) | ORAL | 0 refills | 0.00000 days | Status: CP
Start: 2018-04-29 — End: 2018-05-02

## 2018-05-01 MED FILL — DOXYCYCLINE HYCLATE 100 MG CAPSULE: 7 days supply | Qty: 14 | Fill #0 | Status: AC

## 2018-05-02 ENCOUNTER — Ambulatory Visit: Admit: 2018-05-02 | Discharge: 2018-05-03

## 2018-05-02 DIAGNOSIS — L02421 Furuncle of right axilla: Principal | ICD-10-CM

## 2018-05-02 DIAGNOSIS — L97509 Non-pressure chronic ulcer of other part of unspecified foot with unspecified severity: Secondary | ICD-10-CM

## 2018-05-02 MED ORDER — CEPHALEXIN 500 MG CAPSULE
ORAL_CAPSULE | Freq: Three times a day (TID) | ORAL | 1 refills | 0.00000 days | Status: CP
Start: 2018-05-02 — End: 2018-05-16
  Filled 2018-05-02: qty 21, 7d supply, fill #0

## 2018-05-02 MED ORDER — DOXYCYCLINE HYCLATE 100 MG CAPSULE
ORAL_CAPSULE | Freq: Two times a day (BID) | ORAL | 0 refills | 0.00000 days | Status: CP
Start: 2018-05-02 — End: 2018-05-15
  Filled 2018-05-01 – 2018-05-08 (×2): qty 14, 7d supply, fill #0

## 2018-05-02 MED ORDER — CEPHALEXIN 500 MG CAPSULE: 500 mg | capsule | Freq: Three times a day (TID) | 1 refills | 0 days | Status: AC

## 2018-05-02 MED FILL — CEPHALEXIN 500 MG CAPSULE: 7 days supply | Qty: 21 | Fill #0 | Status: AC

## 2018-05-03 ENCOUNTER — Ambulatory Visit: Admit: 2018-05-03 | Discharge: 2018-05-04

## 2018-05-03 DIAGNOSIS — R1011 Right upper quadrant pain: Principal | ICD-10-CM

## 2018-05-06 ENCOUNTER — Ambulatory Visit: Admit: 2018-05-06 | Discharge: 2018-05-07 | Attending: Internal Medicine | Primary: Internal Medicine

## 2018-05-08 ENCOUNTER — Ambulatory Visit: Admit: 2018-05-08 | Discharge: 2018-05-09

## 2018-05-08 DIAGNOSIS — R7989 Other specified abnormal findings of blood chemistry: Secondary | ICD-10-CM

## 2018-05-08 DIAGNOSIS — Z79899 Other long term (current) drug therapy: Principal | ICD-10-CM

## 2018-05-08 MED FILL — DOXYCYCLINE HYCLATE 100 MG CAPSULE: 7 days supply | Qty: 14 | Fill #0 | Status: AC

## 2018-05-08 MED FILL — CEPHALEXIN 500 MG CAPSULE: ORAL | 7 days supply | Qty: 21 | Fill #1

## 2018-05-08 MED FILL — CEPHALEXIN 500 MG CAPSULE: 7 days supply | Qty: 21 | Fill #1 | Status: AC

## 2018-05-20 ENCOUNTER — Institutional Professional Consult (permissible substitution): Admit: 2018-05-20 | Discharge: 2018-05-21 | Attending: Internal Medicine | Primary: Internal Medicine

## 2018-05-20 DIAGNOSIS — N6452 Nipple discharge: Secondary | ICD-10-CM

## 2018-05-20 DIAGNOSIS — E221 Hyperprolactinemia: Principal | ICD-10-CM

## 2018-05-20 DIAGNOSIS — L739 Follicular disorder, unspecified: Secondary | ICD-10-CM

## 2018-05-20 DIAGNOSIS — R109 Unspecified abdominal pain: Secondary | ICD-10-CM
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
## 2018-05-21 MED FILL — METHOTREXATE SODIUM 2.5 MG TABLET: ORAL | 28 days supply | Qty: 24 | Fill #1

## 2018-05-21 MED FILL — METHOTREXATE SODIUM 2.5 MG TABLET: 28 days supply | Qty: 24 | Fill #1 | Status: AC

## 2018-05-21 MED FILL — TRUVADA 200 MG-300 MG TABLET: ORAL | 30 days supply | Qty: 30 | Fill #7

## 2018-05-21 MED FILL — TRUVADA 200 MG-300 MG TABLET: 30 days supply | Qty: 30 | Fill #7 | Status: AC

## 2018-05-24 ENCOUNTER — Ambulatory Visit: Admit: 2018-05-24 | Discharge: 2018-05-25

## 2018-05-24 DIAGNOSIS — E221 Hyperprolactinemia: Principal | ICD-10-CM

## 2018-05-24 DIAGNOSIS — N6452 Nipple discharge: Secondary | ICD-10-CM

## 2018-05-31 ENCOUNTER — Telehealth
Admit: 2018-05-31 | Discharge: 2018-06-01 | Attending: Student in an Organized Health Care Education/Training Program | Primary: Student in an Organized Health Care Education/Training Program

## 2018-05-31 DIAGNOSIS — D352 Benign neoplasm of pituitary gland: Principal | ICD-10-CM

## 2018-05-31 DIAGNOSIS — E229 Hyperfunction of pituitary gland, unspecified: Secondary | ICD-10-CM

## 2018-06-04 ENCOUNTER — Ambulatory Visit: Admit: 2018-06-04 | Discharge: 2018-06-05

## 2018-06-04 DIAGNOSIS — N643 Galactorrhea not associated with childbirth: Principal | ICD-10-CM

## 2018-06-06 ENCOUNTER — Ambulatory Visit: Admit: 2018-06-06 | Discharge: 2018-06-06 | Disposition: A | Discharge status: Home / Self Care | Payer: MEDICAID

## 2018-06-06 MED ORDER — AMOXICILLIN 875 MG-POTASSIUM CLAVULANATE 125 MG TABLET
ORAL_TABLET | Freq: Two times a day (BID) | ORAL | 0 refills | 0.00000 days | Status: CP
Start: 2018-06-06 — End: 2018-06-16
  Filled 2018-06-06: qty 20, 10d supply, fill #0

## 2018-06-06 MED FILL — AMOXICILLIN 875 MG-POTASSIUM CLAVULANATE 125 MG TABLET: 10 days supply | Qty: 20 | Fill #0 | Status: AC

## 2018-06-25 MED FILL — ONDANSETRON HCL 4 MG TABLET: ORAL | 30 days supply | Qty: 30 | Fill #1

## 2018-06-25 MED FILL — METHOTREXATE SODIUM 2.5 MG TABLET: ORAL | 28 days supply | Qty: 24 | Fill #2

## 2018-06-25 MED FILL — METHOTREXATE SODIUM 2.5 MG TABLET: 28 days supply | Qty: 24 | Fill #2 | Status: AC

## 2018-06-25 MED FILL — ONDANSETRON HCL 4 MG TABLET: 30 days supply | Qty: 30 | Fill #1 | Status: AC

## 2018-07-03 ENCOUNTER — Ambulatory Visit: Admit: 2018-07-03 | Discharge: 2018-07-03 | Disposition: A | Discharge status: Home / Self Care | Payer: MEDICAID

## 2018-07-03 ENCOUNTER — Emergency Department: Admit: 2018-07-03 | Discharge: 2018-07-03 | Disposition: A | Discharge status: Home / Self Care | Payer: MEDICAID

## 2018-07-03 DIAGNOSIS — K0889 Other specified disorders of teeth and supporting structures: Principal | ICD-10-CM

## 2018-07-03 MED ORDER — OXYCODONE 5 MG TABLET
ORAL_TABLET | ORAL | 0 refills | 0.00000 days | Status: CP | PRN
Start: 2018-07-03 — End: 2018-07-04

## 2018-07-03 MED ORDER — CLINDAMYCIN HCL 300 MG CAPSULE
ORAL_CAPSULE | Freq: Four times a day (QID) | ORAL | 0 refills | 0 days | Status: CP
Start: 2018-07-03 — End: 2018-07-22
  Filled 2018-07-04: qty 40, 10d supply, fill #0

## 2018-07-03 MED FILL — FOLIC ACID 1 MG TABLET: ORAL | 90 days supply | Qty: 90 | Fill #1

## 2018-07-03 MED FILL — TRUVADA 200 MG-300 MG TABLET: 30 days supply | Qty: 30 | Fill #8 | Status: AC

## 2018-07-03 MED FILL — TRUVADA 200 MG-300 MG TABLET: ORAL | 30 days supply | Qty: 30 | Fill #8

## 2018-07-03 MED FILL — FOLIC ACID 1 MG TABLET: 90 days supply | Qty: 90 | Fill #1 | Status: AC

## 2018-07-04 MED ORDER — OXYCODONE 5 MG TABLET
ORAL_TABLET | ORAL | 0 refills | 0.00000 days | Status: CP | PRN
Start: 2018-07-04 — End: 2018-07-22

## 2018-07-04 MED FILL — CLINDAMYCIN HCL 300 MG CAPSULE: 10 days supply | Qty: 40 | Fill #0 | Status: AC

## 2018-07-08 ENCOUNTER — Ambulatory Visit: Admit: 2018-07-08 | Discharge: 2018-07-09 | Attending: Internal Medicine | Primary: Internal Medicine

## 2018-07-08 DIAGNOSIS — Z113 Encounter for screening for infections with a predominantly sexual mode of transmission: Secondary | ICD-10-CM

## 2018-07-08 DIAGNOSIS — Z124 Encounter for screening for malignant neoplasm of cervix: Principal | ICD-10-CM

## 2018-07-12 MED ORDER — METRONIDAZOLE 500 MG TABLET
ORAL_TABLET | Freq: Two times a day (BID) | ORAL | 0 refills | 0 days | Status: CP
Start: 2018-07-12 — End: 2018-07-19

## 2018-07-18 ENCOUNTER — Ambulatory Visit: Admit: 2018-07-18 | Discharge: 2018-07-19

## 2018-07-18 DIAGNOSIS — M2550 Pain in unspecified joint: Principal | ICD-10-CM

## 2018-07-22 ENCOUNTER — Institutional Professional Consult (permissible substitution): Admit: 2018-07-22 | Discharge: 2018-07-23

## 2018-07-22 DIAGNOSIS — M2550 Pain in unspecified joint: Principal | ICD-10-CM

## 2018-07-22 MED ORDER — METHOTREXATE SODIUM 2.5 MG TABLET
ORAL_TABLET | ORAL | 4 refills | 28.00000 days | Status: CP
Start: 2018-07-22 — End: ?
  Filled 2018-07-26: qty 32, 28d supply, fill #0

## 2018-07-24 ENCOUNTER — Ambulatory Visit: Admit: 2018-07-24 | Discharge: 2018-07-25 | Attending: Internal Medicine | Primary: Internal Medicine

## 2018-07-24 DIAGNOSIS — R162 Hepatomegaly with splenomegaly, not elsewhere classified: Principal | ICD-10-CM

## 2018-07-24 DIAGNOSIS — R160 Hepatomegaly, not elsewhere classified: Secondary | ICD-10-CM

## 2018-07-26 MED FILL — METHOTREXATE SODIUM 2.5 MG TABLET: 28 days supply | Qty: 32 | Fill #0 | Status: AC

## 2018-08-07 MED FILL — OMEPRAZOLE 20 MG CAPSULE,DELAYED RELEASE: ORAL | 90 days supply | Qty: 90 | Fill #4

## 2018-08-07 MED FILL — VENLAFAXINE 75 MG TABLET: ORAL | 90 days supply | Qty: 180 | Fill #4

## 2018-08-07 MED FILL — OMEPRAZOLE 20 MG CAPSULE,DELAYED RELEASE: 90 days supply | Qty: 90 | Fill #4 | Status: AC

## 2018-08-07 MED FILL — TRUVADA 200 MG-300 MG TABLET: ORAL | 30 days supply | Qty: 30 | Fill #9

## 2018-08-07 MED FILL — VENLAFAXINE 75 MG TABLET: 90 days supply | Qty: 180 | Fill #4 | Status: AC

## 2018-08-07 MED FILL — TRUVADA 200 MG-300 MG TABLET: 30 days supply | Qty: 30 | Fill #9 | Status: AC

## 2018-08-13 ENCOUNTER — Ambulatory Visit: Admit: 2018-08-13 | Discharge: 2018-08-14 | Discharge status: Against Medical Advice | Payer: MEDICAID

## 2018-08-14 ENCOUNTER — Ambulatory Visit: Admit: 2018-08-14 | Discharge: 2018-08-15 | Attending: Obstetrics & Gynecology | Primary: Obstetrics & Gynecology

## 2018-08-14 ENCOUNTER — Ambulatory Visit: Admit: 2018-08-14 | Discharge: 2018-08-14 | Disposition: A | Discharge status: Home / Self Care | Payer: MEDICAID

## 2018-08-14 ENCOUNTER — Emergency Department: Admit: 2018-08-14 | Discharge: 2018-08-14 | Disposition: A | Discharge status: Home / Self Care | Payer: MEDICAID

## 2018-08-14 DIAGNOSIS — B977 Papillomavirus as the cause of diseases classified elsewhere: Secondary | ICD-10-CM

## 2018-08-14 DIAGNOSIS — N879 Dysplasia of cervix uteri, unspecified: Secondary | ICD-10-CM

## 2018-08-14 DIAGNOSIS — R87612 Low grade squamous intraepithelial lesion on cytologic smear of cervix (LGSIL): Principal | ICD-10-CM

## 2018-08-14 DIAGNOSIS — K0889 Other specified disorders of teeth and supporting structures: Principal | ICD-10-CM

## 2018-08-14 MED ORDER — CLINDAMYCIN HCL 150 MG CAPSULE
ORAL_CAPSULE | Freq: Three times a day (TID) | ORAL | 0 refills | 7.00000 days | Status: CP
Start: 2018-08-14 — End: 2018-08-21
  Filled 2018-08-14: qty 42, 7d supply, fill #0

## 2018-08-14 MED ORDER — HYDROCODONE 5 MG-ACETAMINOPHEN 325 MG TABLET
ORAL_TABLET | Freq: Four times a day (QID) | ORAL | 0 refills | 2.00000 days | Status: CP | PRN
Start: 2018-08-14 — End: 2018-08-19
  Filled 2018-08-14: qty 5, 2d supply, fill #0

## 2018-08-14 MED FILL — HYDROCODONE 5 MG-ACETAMINOPHEN 325 MG TABLET: 2 days supply | Qty: 5 | Fill #0 | Status: AC

## 2018-08-14 MED FILL — CLINDAMYCIN HCL 150 MG CAPSULE: 7 days supply | Qty: 42 | Fill #0 | Status: AC

## 2018-08-26 ENCOUNTER — Ambulatory Visit: Admit: 2018-08-26 | Discharge: 2018-08-27 | Attending: Internal Medicine | Primary: Internal Medicine

## 2018-08-26 DIAGNOSIS — M25562 Pain in left knee: Secondary | ICD-10-CM

## 2018-08-26 DIAGNOSIS — F329 Major depressive disorder, single episode, unspecified: Secondary | ICD-10-CM

## 2018-08-26 DIAGNOSIS — R109 Unspecified abdominal pain: Secondary | ICD-10-CM

## 2018-08-26 DIAGNOSIS — F419 Anxiety disorder, unspecified: Principal | ICD-10-CM

## 2018-08-26 MED ORDER — VENLAFAXINE 75 MG TABLET
ORAL_TABLET | Freq: Two times a day (BID) | ORAL | 3 refills | 90.00000 days | Status: CP
Start: 2018-08-26 — End: 2018-11-24
  Filled 2018-11-19: qty 270, 90d supply, fill #0

## 2018-09-09 MED FILL — TRUVADA 200 MG-300 MG TABLET: ORAL | 30 days supply | Qty: 30 | Fill #10

## 2018-09-09 MED FILL — TRUVADA 200 MG-300 MG TABLET: 30 days supply | Qty: 30 | Fill #10 | Status: AC

## 2018-09-09 MED FILL — METHOTREXATE SODIUM 2.5 MG TABLET: 28 days supply | Qty: 32 | Fill #1 | Status: AC

## 2018-09-09 MED FILL — METHOTREXATE SODIUM 2.5 MG TABLET: ORAL | 28 days supply | Qty: 32 | Fill #1

## 2018-09-11 ENCOUNTER — Institutional Professional Consult (permissible substitution): Admit: 2018-09-11 | Discharge: 2018-09-12 | Attending: Social Worker | Primary: Social Worker

## 2018-09-11 DIAGNOSIS — F419 Anxiety disorder, unspecified: Secondary | ICD-10-CM

## 2018-09-11 DIAGNOSIS — F329 Major depressive disorder, single episode, unspecified: Secondary | ICD-10-CM

## 2018-09-11 DIAGNOSIS — F331 Major depressive disorder, recurrent, moderate: Principal | ICD-10-CM

## 2018-09-25 ENCOUNTER — Telehealth: Admit: 2018-09-25 | Discharge: 2018-09-26 | Attending: Social Worker | Primary: Social Worker

## 2018-09-25 DIAGNOSIS — F331 Major depressive disorder, recurrent, moderate: Secondary | ICD-10-CM

## 2018-09-27 MED ORDER — SYNTHROID 50 MCG TABLET
ORAL_TABLET | Freq: Every day | ORAL | 4 refills | 90.00000 days | Status: CP
Start: 2018-09-27 — End: ?
  Filled 2018-10-01: qty 45, 90d supply, fill #0

## 2018-09-27 MED ORDER — ONDANSETRON HCL 4 MG TABLET
ORAL_TABLET | Freq: Every day | ORAL | 3 refills | 30.00000 days | Status: CP | PRN
Start: 2018-09-27 — End: 2019-09-27
  Filled 2018-10-01: qty 30, 30d supply, fill #0

## 2018-10-01 MED FILL — SYNTHROID 50 MCG TABLET: 90 days supply | Qty: 45 | Fill #0 | Status: AC

## 2018-10-01 MED FILL — ONDANSETRON HCL 4 MG TABLET: 30 days supply | Qty: 30 | Fill #0 | Status: AC

## 2018-10-02 ENCOUNTER — Ambulatory Visit: Admit: 2018-10-02 | Discharge: 2018-10-03

## 2018-10-02 DIAGNOSIS — E079 Disorder of thyroid, unspecified: Secondary | ICD-10-CM

## 2018-10-02 DIAGNOSIS — M1712 Unilateral primary osteoarthritis, left knee: Secondary | ICD-10-CM

## 2018-10-02 DIAGNOSIS — M25562 Pain in left knee: Secondary | ICD-10-CM

## 2018-10-02 DIAGNOSIS — Z79899 Other long term (current) drug therapy: Secondary | ICD-10-CM

## 2018-10-02 DIAGNOSIS — K219 Gastro-esophageal reflux disease without esophagitis: Secondary | ICD-10-CM

## 2018-10-07 ENCOUNTER — Institutional Professional Consult (permissible substitution): Admit: 2018-10-07 | Discharge: 2018-10-08 | Attending: Internal Medicine | Primary: Internal Medicine

## 2018-10-07 DIAGNOSIS — Z131 Encounter for screening for diabetes mellitus: Secondary | ICD-10-CM

## 2018-10-07 DIAGNOSIS — Z932 Ileostomy status: Secondary | ICD-10-CM

## 2018-10-07 DIAGNOSIS — Q796 Ehlers-Danlos syndrome, unspecified: Secondary | ICD-10-CM

## 2018-10-07 DIAGNOSIS — Z13220 Encounter for screening for lipoid disorders: Secondary | ICD-10-CM

## 2018-10-07 DIAGNOSIS — Z79899 Other long term (current) drug therapy: Secondary | ICD-10-CM

## 2018-10-09 ENCOUNTER — Telehealth: Admit: 2018-10-09 | Discharge: 2018-10-10 | Attending: Social Worker | Primary: Social Worker

## 2018-10-09 DIAGNOSIS — F331 Major depressive disorder, recurrent, moderate: Secondary | ICD-10-CM

## 2018-10-21 DIAGNOSIS — Z79899 Other long term (current) drug therapy: Secondary | ICD-10-CM

## 2018-10-21 MED ORDER — EMTRICITABINE 200 MG-TENOFOVIR DISOPROXIL FUMARATE 300 MG TABLET
ORAL_TABLET | Freq: Every day | ORAL | 3 refills | 90.00000 days | Status: CP
Start: 2018-10-21 — End: 2019-10-21
  Filled 2018-10-25: qty 30, 30d supply, fill #0

## 2018-10-23 MED FILL — METHOTREXATE SODIUM 2.5 MG TABLET: 84 days supply | Qty: 96 | Fill #2 | Status: AC

## 2018-10-24 ENCOUNTER — Telehealth: Admit: 2018-10-24 | Discharge: 2018-10-25 | Attending: Social Worker | Primary: Social Worker

## 2018-10-24 DIAGNOSIS — F331 Major depressive disorder, recurrent, moderate: Secondary | ICD-10-CM

## 2018-10-25 ENCOUNTER — Ambulatory Visit: Admit: 2018-10-25 | Discharge: 2018-10-26

## 2018-10-25 DIAGNOSIS — Z131 Encounter for screening for diabetes mellitus: Secondary | ICD-10-CM

## 2018-10-25 DIAGNOSIS — Z79899 Other long term (current) drug therapy: Secondary | ICD-10-CM

## 2018-10-25 DIAGNOSIS — Z13220 Encounter for screening for lipoid disorders: Secondary | ICD-10-CM

## 2018-10-25 DIAGNOSIS — M2550 Pain in unspecified joint: Secondary | ICD-10-CM

## 2018-10-25 MED FILL — TRUVADA 200 MG-300 MG TABLET: 30 days supply | Qty: 30 | Fill #0 | Status: AC

## 2018-10-25 MED FILL — METHOTREXATE SODIUM 2.5 MG TABLET: ORAL | 84 days supply | Qty: 96 | Fill #2

## 2018-10-25 MED FILL — FOLIC ACID 1 MG TABLET: ORAL | 90 days supply | Qty: 90 | Fill #2

## 2018-10-25 MED FILL — FOLIC ACID 1 MG TABLET: 90 days supply | Qty: 90 | Fill #2 | Status: AC

## 2018-10-28 ENCOUNTER — Institutional Professional Consult (permissible substitution): Admit: 2018-10-28 | Discharge: 2018-10-29

## 2018-10-28 DIAGNOSIS — Z8739 Personal history of other diseases of the musculoskeletal system and connective tissue: Secondary | ICD-10-CM

## 2018-10-28 MED ORDER — PREDNISONE 5 MG TABLET
ORAL_TABLET | 0 refills | 0 days | Status: CP
Start: 2018-10-28 — End: ?
  Filled 2018-10-31: qty 25, 21d supply, fill #0

## 2018-10-28 MED ORDER — ADALIMUMAB PEN CITRATE FREE 40 MG/0.4 ML
SUBCUTANEOUS | 0 refills | 84.00000 days | Status: CP
Start: 2018-10-28 — End: ?

## 2018-10-29 ENCOUNTER — Ambulatory Visit: Admit: 2018-10-29 | Discharge: 2018-10-30 | Attending: Internal Medicine | Primary: Internal Medicine

## 2018-10-29 DIAGNOSIS — Z8739 Personal history of other diseases of the musculoskeletal system and connective tissue: Secondary | ICD-10-CM

## 2018-10-29 DIAGNOSIS — Z6841 Body Mass Index (BMI) 40.0 and over, adult: Secondary | ICD-10-CM

## 2018-10-29 MED ORDER — LISDEXAMFETAMINE 30 MG CAPSULE
ORAL_CAPSULE | 0 refills | 0 days | Status: CP
Start: 2018-10-29 — End: 2018-10-31

## 2018-10-31 MED ORDER — PHENTERMINE 15 MG CAPSULE
ORAL_CAPSULE | Freq: Every morning | ORAL | 0 refills | 30 days | Status: CP
Start: 2018-10-31 — End: ?
  Filled 2018-11-05: qty 30, 30d supply, fill #0

## 2018-10-31 MED FILL — PREDNISONE 5 MG TABLET: 21 days supply | Qty: 25 | Fill #0 | Status: AC

## 2018-11-05 MED FILL — PHENTERMINE 15 MG CAPSULE: 30 days supply | Qty: 30 | Fill #0 | Status: AC

## 2018-11-19 MED FILL — TRUVADA 200 MG-300 MG TABLET: 30 days supply | Qty: 30 | Fill #1 | Status: AC

## 2018-11-19 MED FILL — TRUVADA 200 MG-300 MG TABLET: ORAL | 30 days supply | Qty: 30 | Fill #1

## 2018-11-19 MED FILL — VENLAFAXINE 75 MG TABLET: 90 days supply | Qty: 270 | Fill #0 | Status: AC

## 2018-11-20 ENCOUNTER — Ambulatory Visit
Admit: 2018-11-20 | Discharge: 2018-11-21 | Attending: Student in an Organized Health Care Education/Training Program | Primary: Student in an Organized Health Care Education/Training Program

## 2018-11-20 ENCOUNTER — Ambulatory Visit: Admit: 2018-11-20 | Discharge: 2018-11-21

## 2018-11-21 ENCOUNTER — Telehealth: Admit: 2018-11-21 | Discharge: 2018-11-22 | Attending: Registered" | Primary: Registered"

## 2018-11-21 DIAGNOSIS — Z6841 Body Mass Index (BMI) 40.0 and over, adult: Secondary | ICD-10-CM

## 2018-11-28 ENCOUNTER — Telehealth
Admit: 2018-11-28 | Discharge: 2018-11-29 | Attending: Student in an Organized Health Care Education/Training Program | Primary: Student in an Organized Health Care Education/Training Program

## 2018-11-28 DIAGNOSIS — G8929 Other chronic pain: Principal | ICD-10-CM

## 2018-11-28 DIAGNOSIS — M25562 Pain in left knee: Principal | ICD-10-CM

## 2018-12-02 MED ORDER — OMEPRAZOLE 20 MG CAPSULE,DELAYED RELEASE
ORAL_CAPSULE | Freq: Every day | ORAL | 4 refills | 90 days | Status: CP
Start: 2018-12-02 — End: 2019-12-02
  Filled 2018-12-04: qty 90, 90d supply, fill #0

## 2018-12-04 ENCOUNTER — Ambulatory Visit: Admit: 2018-12-04 | Discharge: 2018-12-05 | Attending: Family | Primary: Family

## 2018-12-04 DIAGNOSIS — M25572 Pain in left ankle and joints of left foot: Principal | ICD-10-CM

## 2018-12-04 MED FILL — ONDANSETRON HCL 4 MG TABLET: ORAL | 30 days supply | Qty: 30 | Fill #1

## 2018-12-04 MED FILL — ONDANSETRON HCL 4 MG TABLET: 30 days supply | Qty: 30 | Fill #1 | Status: AC

## 2018-12-04 MED FILL — OMEPRAZOLE 20 MG CAPSULE,DELAYED RELEASE: 90 days supply | Qty: 90 | Fill #0 | Status: AC

## 2018-12-05 ENCOUNTER — Ambulatory Visit: Admit: 2018-12-05 | Discharge: 2018-12-06

## 2018-12-06 ENCOUNTER — Ambulatory Visit: Admit: 2018-12-06 | Discharge: 2018-12-07

## 2018-12-12 ENCOUNTER — Telehealth
Admit: 2018-12-12 | Discharge: 2018-12-13 | Attending: Student in an Organized Health Care Education/Training Program | Primary: Student in an Organized Health Care Education/Training Program

## 2018-12-12 DIAGNOSIS — G8929 Other chronic pain: Secondary | ICD-10-CM

## 2018-12-12 DIAGNOSIS — S83512A Sprain of anterior cruciate ligament of left knee, initial encounter: Principal | ICD-10-CM

## 2018-12-12 DIAGNOSIS — M25562 Pain in left knee: Principal | ICD-10-CM

## 2018-12-12 DIAGNOSIS — Z6841 Body Mass Index (BMI) 40.0 and over, adult: Principal | ICD-10-CM

## 2018-12-12 DIAGNOSIS — Q796 Ehlers-Danlos syndrome, unspecified: Principal | ICD-10-CM

## 2018-12-12 DIAGNOSIS — S83232D Complex tear of medial meniscus, current injury, left knee, subsequent encounter: Principal | ICD-10-CM

## 2018-12-18 ENCOUNTER — Ambulatory Visit: Admit: 2018-12-18 | Discharge: 2018-12-19 | Attending: Internal Medicine | Primary: Internal Medicine

## 2018-12-18 DIAGNOSIS — Z932 Ileostomy status: Principal | ICD-10-CM

## 2018-12-18 DIAGNOSIS — Z6841 Body Mass Index (BMI) 40.0 and over, adult: Secondary | ICD-10-CM

## 2018-12-18 DIAGNOSIS — F329 Major depressive disorder, single episode, unspecified: Principal | ICD-10-CM

## 2018-12-18 DIAGNOSIS — K9181 Other intraoperative complications of digestive system: Principal | ICD-10-CM

## 2018-12-18 DIAGNOSIS — K5669 Other partial intestinal obstruction: Secondary | ICD-10-CM

## 2018-12-18 MED ORDER — TOPIRAMATE 25 MG TABLET
ORAL_TABLET | Freq: Every evening | ORAL | 2 refills | 30 days | Status: CP
Start: 2018-12-18 — End: 2019-12-18
  Filled 2018-12-24: qty 90, 90d supply, fill #0

## 2018-12-18 MED ORDER — PHENTERMINE 15 MG CAPSULE
ORAL_CAPSULE | Freq: Every morning | ORAL | 0 refills | 30.00000 days | Status: CP
Start: 2018-12-18 — End: ?

## 2018-12-18 MED ORDER — ONDANSETRON HCL 4 MG TABLET
ORAL_TABLET | Freq: Every day | ORAL | 3 refills | 30 days | Status: CP | PRN
Start: 2018-12-18 — End: 2019-12-18
  Filled 2019-02-06: qty 30, 30d supply, fill #0

## 2018-12-24 MED FILL — TRUVADA 200 MG-300 MG TABLET: 30 days supply | Qty: 30 | Fill #2 | Status: AC

## 2018-12-24 MED FILL — TRUVADA 200 MG-300 MG TABLET: ORAL | 30 days supply | Qty: 30 | Fill #2

## 2018-12-24 MED FILL — TOPIRAMATE 25 MG TABLET: 90 days supply | Qty: 90 | Fill #0 | Status: AC

## 2018-12-26 ENCOUNTER — Telehealth: Admit: 2018-12-26 | Discharge: 2018-12-26 | Attending: Registered" | Primary: Registered"

## 2018-12-26 ENCOUNTER — Ambulatory Visit: Admit: 2018-12-26 | Discharge: 2018-12-26

## 2018-12-26 DIAGNOSIS — Z6841 Body Mass Index (BMI) 40.0 and over, adult: Secondary | ICD-10-CM

## 2019-01-03 ENCOUNTER — Other Ambulatory Visit: Payer: Self-pay

## 2019-01-03 ENCOUNTER — Emergency Department: Payer: Self-pay

## 2019-01-03 ENCOUNTER — Emergency Department
Admission: EM | Admit: 2019-01-03 | Discharge: 2019-01-03 | Disposition: A | Discharge status: Home / Self Care | Payer: Self-pay | Attending: Emergency Medicine | Admitting: Emergency Medicine

## 2019-01-03 DIAGNOSIS — W19XXXA Unspecified fall, initial encounter: Secondary | ICD-10-CM

## 2019-01-03 DIAGNOSIS — M545 Low back pain: Secondary | ICD-10-CM | POA: Insufficient documentation

## 2019-01-03 DIAGNOSIS — M25561 Pain in right knee: Secondary | ICD-10-CM | POA: Insufficient documentation

## 2019-01-03 DIAGNOSIS — Z79899 Other long term (current) drug therapy: Secondary | ICD-10-CM | POA: Insufficient documentation

## 2019-01-03 HISTORY — DX: Sprain of anterior cruciate ligament of unspecified knee, initial encounter: S83.519A

## 2019-01-03 HISTORY — DX: Unspecified tear of unspecified meniscus, current injury, unspecified knee, initial encounter: S83.209A

## 2019-01-03 MED ORDER — HYDROCODONE-ACETAMINOPHEN 5-325 MG PO TABS: 1.0000 | ORAL_TABLET | Freq: Three times a day (TID) | ORAL | 0 refills | Status: AC | PRN

## 2019-01-03 MED ORDER — OXYCODONE-ACETAMINOPHEN 5-325 MG PO TABS
1.0000 | ORAL_TABLET | Freq: Once | ORAL | Status: AC
  Administered 2019-01-03: 17:00:00 1 via ORAL
  Filled 2019-01-03: qty 1

## 2019-01-03 MED ORDER — CYCLOBENZAPRINE HCL 5 MG PO TABS: ORAL_TABLET | ORAL | 0 refills | Status: DC

## 2019-01-03 MED ORDER — KETOROLAC TROMETHAMINE 30 MG/ML IJ SOLN
30.0000 mg | Freq: Once | INTRAMUSCULAR | Status: AC
  Administered 2019-01-03: 30 mg via INTRAMUSCULAR
  Filled 2019-01-03: qty 1

## 2019-01-03 NOTE — Discharge Instructions (Signed)
You may continue your ibuprofen.  Take Flexeril to help relax your muscles.  You can take Percocet for extreme pain.  Please ice and elevate your knee tonight.  Please call primary care on Monday for a follow-up appointment early next week.  Return the emergency department for any worsening of symptoms.

## 2019-01-03 NOTE — ED Notes (Signed)
See triage note  States her knee gave out and she fell out her door   Having pain to lower back and right knee pain
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
## 2019-01-03 NOTE — ED Triage Notes (Signed)
Pt to the er via ems for injuries related to a fall. Pt states her knee gave out and rolled down 4 wooden steps outside onto the ground. Pt has pain in the right knee and lower leg and lower back. Pt has a small abrasion to the palm of the right hand. Pt reports pain to her head. Pt did not lose consciousness.

## 2019-01-08 DIAGNOSIS — M2550 Pain in unspecified joint: Principal | ICD-10-CM

## 2019-01-22 ENCOUNTER — Ambulatory Visit: Admit: 2019-01-22 | Discharge: 2019-01-23 | Attending: Internal Medicine | Primary: Internal Medicine

## 2019-01-22 DIAGNOSIS — Z6841 Body Mass Index (BMI) 40.0 and over, adult: Secondary | ICD-10-CM

## 2019-01-22 DIAGNOSIS — M2550 Pain in unspecified joint: Principal | ICD-10-CM

## 2019-01-22 DIAGNOSIS — F329 Major depressive disorder, single episode, unspecified: Principal | ICD-10-CM

## 2019-01-22 MED ORDER — PHENTERMINE 30 MG CAPSULE
ORAL_CAPSULE | Freq: Every morning | ORAL | 0 refills | 30.00000 days | Status: CP
Start: 2019-01-22 — End: 2019-01-22

## 2019-01-22 MED ORDER — PHENTERMINE 30 MG CAPSULE: 30 mg | capsule | Freq: Every morning | 0 refills | 30 days | Status: AC

## 2019-01-27 ENCOUNTER — Ambulatory Visit: Admit: 2019-01-27 | Discharge: 2019-01-27

## 2019-01-27 DIAGNOSIS — M2550 Pain in unspecified joint: Principal | ICD-10-CM

## 2019-01-27 DIAGNOSIS — R231 Pallor: Principal | ICD-10-CM

## 2019-01-27 DIAGNOSIS — Z8739 Personal history of other diseases of the musculoskeletal system and connective tissue: Principal | ICD-10-CM

## 2019-01-27 MED ORDER — FOLIC ACID 1 MG TABLET
ORAL_TABLET | Freq: Every day | ORAL | 3 refills | 90 days | Status: CP
Start: 2019-01-27 — End: ?
  Filled 2019-02-06: qty 180, 90d supply, fill #0

## 2019-01-27 MED ORDER — METHOTREXATE SODIUM 2.5 MG TABLET
ORAL_TABLET | ORAL | 4 refills | 28 days | Status: CP
Start: 2019-01-27 — End: ?
  Filled 2019-02-06: qty 96, 84d supply, fill #0

## 2019-01-28 DIAGNOSIS — Z9189 Other specified personal risk factors, not elsewhere classified: Principal | ICD-10-CM

## 2019-01-30 ENCOUNTER — Telehealth: Admit: 2019-01-30 | Discharge: 2019-01-31 | Attending: Registered" | Primary: Registered"

## 2019-01-30 DIAGNOSIS — Z6841 Body Mass Index (BMI) 40.0 and over, adult: Secondary | ICD-10-CM

## 2019-02-05 DIAGNOSIS — Z9189 Other specified personal risk factors, not elsewhere classified: Principal | ICD-10-CM

## 2019-02-06 MED FILL — SYNTHROID 50 MCG TABLET: 90 days supply | Qty: 45 | Fill #1 | Status: AC

## 2019-02-06 MED FILL — FOLIC ACID 1 MG TABLET: 90 days supply | Qty: 180 | Fill #0 | Status: AC

## 2019-02-06 MED FILL — SYNTHROID 50 MCG TABLET: ORAL | 90 days supply | Qty: 45 | Fill #1

## 2019-02-06 MED FILL — ONDANSETRON HCL 4 MG TABLET: 30 days supply | Qty: 30 | Fill #0 | Status: AC

## 2019-02-06 MED FILL — METHOTREXATE SODIUM 2.5 MG TABLET: 84 days supply | Qty: 96 | Fill #0 | Status: AC

## 2019-02-11 ENCOUNTER — Ambulatory Visit
Admit: 2019-02-11 | Discharge: 2019-03-12 | Attending: Rehabilitative and Restorative Service Providers" | Primary: Rehabilitative and Restorative Service Providers"

## 2019-02-27 ENCOUNTER — Ambulatory Visit: Admit: 2019-02-27 | Discharge: 2019-02-27 | Payer: MEDICAID | Attending: Family | Primary: Family

## 2019-02-27 ENCOUNTER — Ambulatory Visit: Admit: 2019-02-27 | Discharge: 2019-02-27 | Payer: MEDICAID | Attending: Internal Medicine | Primary: Internal Medicine

## 2019-02-27 DIAGNOSIS — Z6841 Body Mass Index (BMI) 40.0 and over, adult: Secondary | ICD-10-CM

## 2019-02-27 DIAGNOSIS — Z932 Ileostomy status: Principal | ICD-10-CM

## 2019-02-27 DIAGNOSIS — F329 Major depressive disorder, single episode, unspecified: Principal | ICD-10-CM

## 2019-02-27 MED ORDER — PHENTERMINE 30 MG CAPSULE
ORAL_CAPSULE | Freq: Every morning | ORAL | 1 refills | 30 days | Status: CP
Start: 2019-02-27 — End: ?

## 2019-03-03 ENCOUNTER — Ambulatory Visit: Admit: 2019-03-03 | Discharge: 2019-03-04 | Payer: MEDICAID | Attending: Family | Primary: Family

## 2019-03-03 MED ORDER — PHENTERMINE 30 MG CAPSULE
ORAL_CAPSULE | Freq: Every morning | ORAL | 1 refills | 30.00000 days | Status: CP
Start: 2019-03-03 — End: ?
  Filled 2019-03-05: qty 30, 30d supply, fill #0

## 2019-03-05 MED FILL — PHENTERMINE 30 MG CAPSULE: 30 days supply | Qty: 30 | Fill #0 | Status: AC

## 2019-03-06 ENCOUNTER — Telehealth: Admit: 2019-03-06 | Discharge: 2019-03-07 | Attending: Registered" | Primary: Registered"

## 2019-03-06 DIAGNOSIS — Z6841 Body Mass Index (BMI) 40.0 and over, adult: Principal | ICD-10-CM

## 2019-03-11 MED FILL — TRUVADA 200 MG-300 MG TABLET: ORAL | 30 days supply | Qty: 30 | Fill #3

## 2019-03-11 MED FILL — TRUVADA 200 MG-300 MG TABLET: 30 days supply | Qty: 30 | Fill #3 | Status: AC

## 2019-03-17 ENCOUNTER — Ambulatory Visit: Admit: 2019-03-17 | Discharge: 2019-03-18 | Attending: Internal Medicine | Primary: Internal Medicine

## 2019-03-17 DIAGNOSIS — F329 Major depressive disorder, single episode, unspecified: Principal | ICD-10-CM

## 2019-03-18 DIAGNOSIS — Q796 Ehlers-Danlos syndrome, unspecified: Principal | ICD-10-CM

## 2019-03-18 DIAGNOSIS — F329 Major depressive disorder, single episode, unspecified: Principal | ICD-10-CM

## 2019-03-18 DIAGNOSIS — K6389 Other specified diseases of intestine: Principal | ICD-10-CM

## 2019-03-18 DIAGNOSIS — Z932 Ileostomy status: Principal | ICD-10-CM

## 2019-03-18 DIAGNOSIS — K55039 Acute (reversible) ischemia of large intestine, extent unspecified: Principal | ICD-10-CM

## 2019-03-18 DIAGNOSIS — F419 Anxiety disorder, unspecified: Principal | ICD-10-CM

## 2019-03-25 MED FILL — VENLAFAXINE 75 MG TABLET: ORAL | 90 days supply | Qty: 270 | Fill #1

## 2019-03-25 MED FILL — VENLAFAXINE 75 MG TABLET: 90 days supply | Qty: 270 | Fill #1 | Status: AC

## 2019-03-26 ENCOUNTER — Ambulatory Visit: Admit: 2019-03-26 | Discharge: 2019-03-27

## 2019-03-26 DIAGNOSIS — Z932 Ileostomy status: Principal | ICD-10-CM

## 2019-03-27 DIAGNOSIS — Z932 Ileostomy status: Principal | ICD-10-CM

## 2019-03-31 ENCOUNTER — Institutional Professional Consult (permissible substitution): Admit: 2019-03-31 | Discharge: 2019-04-01 | Attending: Internal Medicine | Primary: Internal Medicine

## 2019-03-31 DIAGNOSIS — F419 Anxiety disorder, unspecified: Principal | ICD-10-CM

## 2019-03-31 DIAGNOSIS — F329 Major depressive disorder, single episode, unspecified: Principal | ICD-10-CM

## 2019-03-31 MED ORDER — BUSPIRONE 5 MG TABLET
ORAL_TABLET | Freq: Two times a day (BID) | ORAL | 11 refills | 30.00000 days | Status: CP
Start: 2019-03-31 — End: 2020-03-30
  Filled 2019-04-03: qty 180, 90d supply, fill #0

## 2019-03-31 MED FILL — OMEPRAZOLE 20 MG CAPSULE,DELAYED RELEASE: 90 days supply | Qty: 90 | Fill #1 | Status: AC

## 2019-03-31 MED FILL — ONDANSETRON HCL 4 MG TABLET: 30 days supply | Qty: 30 | Fill #1 | Status: AC

## 2019-03-31 MED FILL — ONDANSETRON HCL 4 MG TABLET: ORAL | 30 days supply | Qty: 30 | Fill #1

## 2019-03-31 MED FILL — OMEPRAZOLE 20 MG CAPSULE,DELAYED RELEASE: ORAL | 90 days supply | Qty: 90 | Fill #1

## 2019-04-01 ENCOUNTER — Ambulatory Visit: Admit: 2019-04-01 | Discharge: 2019-04-02

## 2019-04-01 ENCOUNTER — Ambulatory Visit: Admit: 2019-04-01 | Discharge: 2019-04-02 | Attending: Internal Medicine | Primary: Internal Medicine

## 2019-04-01 DIAGNOSIS — R0781 Pleurodynia: Principal | ICD-10-CM

## 2019-04-03 ENCOUNTER — Telehealth: Admit: 2019-04-03 | Discharge: 2019-04-04 | Attending: Registered" | Primary: Registered"

## 2019-04-03 DIAGNOSIS — Z6841 Body Mass Index (BMI) 40.0 and over, adult: Principal | ICD-10-CM

## 2019-04-03 MED FILL — BUSPIRONE 5 MG TABLET: 90 days supply | Qty: 180 | Fill #0 | Status: AC

## 2019-04-05 ENCOUNTER — Encounter: Payer: Self-pay | Admitting: Emergency Medicine

## 2019-04-05 ENCOUNTER — Other Ambulatory Visit: Payer: Self-pay

## 2019-04-05 ENCOUNTER — Emergency Department
Admission: EM | Admit: 2019-04-05 | Discharge: 2019-04-05 | Disposition: A | Discharge status: Home / Self Care | Payer: Self-pay | Attending: Emergency Medicine | Admitting: Emergency Medicine

## 2019-04-05 DIAGNOSIS — R0789 Other chest pain: Secondary | ICD-10-CM | POA: Insufficient documentation

## 2019-04-05 DIAGNOSIS — Q7963 Vascular Ehlers-Danlos syndrome: Secondary | ICD-10-CM | POA: Insufficient documentation

## 2019-04-05 DIAGNOSIS — R0781 Pleurodynia: Secondary | ICD-10-CM | POA: Insufficient documentation

## 2019-04-05 DIAGNOSIS — Z79899 Other long term (current) drug therapy: Secondary | ICD-10-CM | POA: Insufficient documentation

## 2019-04-05 MED ORDER — LIDOCAINE 5 % EX PTCH: 1.0000 | MEDICATED_PATCH | CUTANEOUS | 0 refills | Status: AC

## 2019-04-05 MED ORDER — KETOROLAC TROMETHAMINE 30 MG/ML IJ SOLN
30.0000 mg | Freq: Once | INTRAMUSCULAR | Status: AC
  Administered 2019-04-05: 30 mg via INTRAMUSCULAR
  Filled 2019-04-05: qty 1

## 2019-04-05 MED ORDER — CYCLOBENZAPRINE HCL 5 MG PO TABS: 5.0000 mg | ORAL_TABLET | Freq: Three times a day (TID) | ORAL | 0 refills | Status: AC | PRN

## 2019-04-05 MED ORDER — CYCLOBENZAPRINE HCL 10 MG PO TABS
10.0000 mg | ORAL_TABLET | Freq: Once | ORAL | Status: AC
  Administered 2019-04-05: 15:00:00 10 mg via ORAL
  Filled 2019-04-05: qty 1

## 2019-04-05 MED ORDER — LIDOCAINE 5 % EX PTCH
1.0000 | MEDICATED_PATCH | Freq: Once | CUTANEOUS | Status: DC
  Administered 2019-04-05: 1 via TRANSDERMAL
  Filled 2019-04-05: qty 1

## 2019-04-05 MED ORDER — KETOROLAC TROMETHAMINE 10 MG PO TABS: 10.0000 mg | ORAL_TABLET | Freq: Three times a day (TID) | ORAL | 0 refills | Status: AC
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
## 2019-04-05 NOTE — ED Triage Notes (Signed)
Pt to ED via POV stating that she has been having Rib pain for the past week. Pt states that she was "hugged" really tight last week and that she heard something pop. Pt states that she has been having pain since then. Pt went to her PCP and had x-ray done, which was negative. Pt states that this morning she heard something pop again. Pt states that it hurts to lift her right arm and hurts when she breaths. Pt is in NAD.
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
## 2019-04-05 NOTE — Discharge Instructions (Signed)
Your exam is consistent with chest wall and rib pain. You will be treated with muscle relaxants, anti-inflammatories, and Lidoderm patches. Follow-up with your provider for ongoing symptoms. Return if needed.

## 2019-04-07 ENCOUNTER — Institutional Professional Consult (permissible substitution): Admit: 2019-04-07 | Discharge: 2019-04-08 | Attending: Internal Medicine | Primary: Internal Medicine

## 2019-04-07 DIAGNOSIS — Z79899 Other long term (current) drug therapy: Principal | ICD-10-CM

## 2019-04-14 MED FILL — TRUVADA 200 MG-300 MG TABLET: ORAL | 30 days supply | Qty: 30 | Fill #4

## 2019-04-14 MED FILL — TRUVADA 200 MG-300 MG TABLET: 30 days supply | Qty: 30 | Fill #4 | Status: AC

## 2019-04-15 ENCOUNTER — Ambulatory Visit: Admit: 2019-04-15 | Discharge: 2019-04-16 | Attending: Internal Medicine | Primary: Internal Medicine

## 2019-04-15 DIAGNOSIS — R350 Frequency of micturition: Principal | ICD-10-CM

## 2019-04-15 DIAGNOSIS — Z79899 Other long term (current) drug therapy: Principal | ICD-10-CM

## 2019-04-15 MED ORDER — NITROFURANTOIN MONOHYDRATE/MACROCRYSTALS 100 MG CAPSULE
ORAL_CAPSULE | Freq: Two times a day (BID) | ORAL | 0 refills | 5 days | Status: CP
Start: 2019-04-15 — End: 2019-04-20
  Filled 2019-04-15: qty 10, 5d supply, fill #0

## 2019-04-15 MED ORDER — FLUCONAZOLE 150 MG TABLET
ORAL_TABLET | Freq: Once | ORAL | 0 refills | 1.00000 days | Status: CP
Start: 2019-04-15 — End: 2019-04-16
  Filled 2019-04-15: qty 1, 1d supply, fill #0

## 2019-04-15 MED FILL — FLUCONAZOLE 150 MG TABLET: 1 days supply | Qty: 1 | Fill #0 | Status: AC

## 2019-04-15 MED FILL — NITROFURANTOIN MONOHYDRATE/MACROCRYSTALS 100 MG CAPSULE: 5 days supply | Qty: 10 | Fill #0 | Status: AC

## 2019-04-21 ENCOUNTER — Institutional Professional Consult (permissible substitution): Admit: 2019-04-21 | Discharge: 2019-04-22 | Attending: Internal Medicine | Primary: Internal Medicine

## 2019-04-21 DIAGNOSIS — F329 Major depressive disorder, single episode, unspecified: Principal | ICD-10-CM

## 2019-04-21 DIAGNOSIS — F419 Anxiety disorder, unspecified: Principal | ICD-10-CM

## 2019-04-21 DIAGNOSIS — Z79899 Other long term (current) drug therapy: Principal | ICD-10-CM

## 2019-04-21 MED ORDER — BUSPIRONE 10 MG TABLET
ORAL_TABLET | Freq: Two times a day (BID) | ORAL | 3 refills | 90.00000 days | Status: CP
Start: 2019-04-21 — End: 2020-04-20
  Filled 2019-04-23: qty 180, 90d supply, fill #0

## 2019-04-22 ENCOUNTER — Ambulatory Visit: Admit: 2019-04-22 | Discharge: 2019-04-23 | Attending: Internal Medicine | Primary: Internal Medicine

## 2019-04-22 DIAGNOSIS — R300 Dysuria: Principal | ICD-10-CM

## 2019-04-22 DIAGNOSIS — Z6841 Body Mass Index (BMI) 40.0 and over, adult: Secondary | ICD-10-CM

## 2019-04-22 DIAGNOSIS — Z79899 Other long term (current) drug therapy: Principal | ICD-10-CM

## 2019-04-22 DIAGNOSIS — F329 Major depressive disorder, single episode, unspecified: Principal | ICD-10-CM

## 2019-04-22 MED ORDER — PHENTERMINE 30 MG CAPSULE
ORAL_CAPSULE | Freq: Every morning | ORAL | 1 refills | 30 days | Status: CP
Start: 2019-04-22 — End: ?

## 2019-04-22 MED ORDER — TOPIRAMATE 25 MG TABLET
ORAL_TABLET | Freq: Every evening | ORAL | 2 refills | 30 days | Status: CP
Start: 2019-04-22 — End: 2020-04-21
  Filled 2019-04-23: qty 90, 90d supply, fill #0

## 2019-04-23 MED ORDER — METRONIDAZOLE 500 MG TABLET
ORAL_TABLET | Freq: Two times a day (BID) | ORAL | 0 refills | 7 days | Status: CP
Start: 2019-04-23 — End: 2019-04-30
  Filled 2019-04-29: qty 14, 7d supply, fill #0

## 2019-04-23 MED FILL — TOPIRAMATE 25 MG TABLET: 90 days supply | Qty: 90 | Fill #0 | Status: AC

## 2019-04-23 MED FILL — BUSPIRONE 10 MG TABLET: 90 days supply | Qty: 180 | Fill #0 | Status: AC

## 2019-04-29 MED FILL — METRONIDAZOLE 500 MG TABLET: 7 days supply | Qty: 14 | Fill #0 | Status: AC

## 2019-05-01 ENCOUNTER — Telehealth: Admit: 2019-05-01 | Discharge: 2019-05-02 | Attending: Registered" | Primary: Registered"

## 2019-05-01 DIAGNOSIS — Z6841 Body Mass Index (BMI) 40.0 and over, adult: Secondary | ICD-10-CM

## 2019-05-05 ENCOUNTER — Ambulatory Visit: Admit: 2019-05-05 | Discharge: 2019-05-06

## 2019-05-08 ENCOUNTER — Encounter: Admit: 2019-05-08 | Discharge: 2019-05-08 | Attending: Anesthesiology | Primary: Anesthesiology

## 2019-05-08 ENCOUNTER — Ambulatory Visit: Admit: 2019-05-08 | Discharge: 2019-05-08

## 2019-05-12 ENCOUNTER — Telehealth: Admit: 2019-05-12 | Discharge: 2019-05-13 | Attending: Internal Medicine | Primary: Internal Medicine

## 2019-05-12 DIAGNOSIS — F419 Anxiety disorder, unspecified: Principal | ICD-10-CM

## 2019-05-12 DIAGNOSIS — F329 Major depressive disorder, single episode, unspecified: Principal | ICD-10-CM

## 2019-05-12 DIAGNOSIS — R109 Unspecified abdominal pain: Principal | ICD-10-CM

## 2019-05-12 MED ORDER — PROMETHAZINE 25 MG TABLET
ORAL_TABLET | Freq: Four times a day (QID) | ORAL | 0 refills | 8 days | Status: CP | PRN
Start: 2019-05-12 — End: 2019-05-19
  Filled 2019-05-12: qty 30, 7d supply, fill #0

## 2019-05-12 MED ORDER — OXYCODONE 5 MG TABLET
ORAL_TABLET | ORAL | 0 refills | 2 days | Status: CP | PRN
Start: 2019-05-12 — End: ?
  Filled 2019-05-12: qty 5, 1d supply, fill #0

## 2019-05-12 MED FILL — OXYCODONE 5 MG TABLET: 1 days supply | Qty: 5 | Fill #0 | Status: AC

## 2019-05-12 MED FILL — PROMETHAZINE 25 MG TABLET: 7 days supply | Qty: 30 | Fill #0 | Status: AC

## 2019-05-16 MED FILL — TRUVADA 200 MG-300 MG TABLET: 30 days supply | Qty: 30 | Fill #5 | Status: AC

## 2019-05-16 MED FILL — FOLIC ACID 1 MG TABLET: 90 days supply | Qty: 180 | Fill #1 | Status: AC

## 2019-05-16 MED FILL — FOLIC ACID 1 MG TABLET: ORAL | 90 days supply | Qty: 180 | Fill #1

## 2019-05-16 MED FILL — TRUVADA 200 MG-300 MG TABLET: ORAL | 30 days supply | Qty: 30 | Fill #5

## 2019-05-30 MED FILL — SYNTHROID 50 MCG TABLET: 90 days supply | Qty: 45 | Fill #2 | Status: AC

## 2019-05-30 MED FILL — SYNTHROID 50 MCG TABLET: ORAL | 90 days supply | Qty: 45 | Fill #2

## 2019-06-05 ENCOUNTER — Ambulatory Visit: Admit: 2019-06-05 | Discharge: 2019-06-06 | Attending: Internal Medicine | Primary: Internal Medicine

## 2019-06-05 DIAGNOSIS — Z6841 Body Mass Index (BMI) 40.0 and over, adult: Secondary | ICD-10-CM

## 2019-06-05 DIAGNOSIS — Z932 Ileostomy status: Principal | ICD-10-CM

## 2019-06-05 DIAGNOSIS — F329 Major depressive disorder, single episode, unspecified: Principal | ICD-10-CM

## 2019-06-05 MED ORDER — TOPIRAMATE 25 MG TABLET
ORAL_TABLET | Freq: Two times a day (BID) | ORAL | 2 refills | 30 days | Status: CP
Start: 2019-06-05 — End: 2020-06-04
  Filled 2019-07-08: qty 60, 30d supply, fill #0

## 2019-06-12 MED ORDER — PHENTERMINE 30 MG CAPSULE
ORAL_CAPSULE | Freq: Every morning | ORAL | 1 refills | 30.00000 days | Status: CP
Start: 2019-06-12 — End: ?

## 2019-06-16 ENCOUNTER — Ambulatory Visit: Admit: 2019-06-16 | Discharge: 2019-06-16

## 2019-06-16 DIAGNOSIS — Z8739 Personal history of other diseases of the musculoskeletal system and connective tissue: Principal | ICD-10-CM

## 2019-06-16 MED ORDER — ADALIMUMAB PEN CITRATE FREE 40 MG/0.4 ML
SUBCUTANEOUS | 1 refills | 42.00000 days
Start: 2019-06-16 — End: ?

## 2019-06-22 ENCOUNTER — Ambulatory Visit
Admit: 2019-06-22 | Discharge: 2019-06-23 | Disposition: A | Discharge status: Home / Self Care | Payer: MEDICAID | Attending: Emergency Medicine

## 2019-06-22 ENCOUNTER — Emergency Department
Admit: 2019-06-22 | Discharge: 2019-06-23 | Disposition: A | Discharge status: Home / Self Care | Payer: MEDICAID | Attending: Emergency Medicine

## 2019-06-24 MED FILL — TRUVADA 200 MG-300 MG TABLET: ORAL | 30 days supply | Qty: 30 | Fill #6

## 2019-06-24 MED FILL — TRUVADA 200 MG-300 MG TABLET: 30 days supply | Qty: 30 | Fill #6 | Status: AC

## 2019-06-24 MED FILL — VENLAFAXINE 75 MG TABLET: ORAL | 90 days supply | Qty: 270 | Fill #2

## 2019-06-24 MED FILL — VENLAFAXINE 75 MG TABLET: 90 days supply | Qty: 270 | Fill #2 | Status: AC

## 2019-06-30 DIAGNOSIS — N63 Unspecified lump in unspecified breast: Principal | ICD-10-CM

## 2019-07-01 ENCOUNTER — Ambulatory Visit: Admit: 2019-07-01 | Discharge: 2019-07-01

## 2019-07-01 DIAGNOSIS — K219 Gastro-esophageal reflux disease without esophagitis: Principal | ICD-10-CM

## 2019-07-01 DIAGNOSIS — F419 Anxiety disorder, unspecified: Principal | ICD-10-CM

## 2019-07-01 DIAGNOSIS — M1990 Unspecified osteoarthritis, unspecified site: Principal | ICD-10-CM

## 2019-07-01 DIAGNOSIS — F329 Major depressive disorder, single episode, unspecified: Principal | ICD-10-CM

## 2019-07-01 DIAGNOSIS — K56699 Other intestinal obstruction unspecified as to partial versus complete obstruction: Principal | ICD-10-CM

## 2019-07-01 DIAGNOSIS — Z01818 Encounter for other preprocedural examination: Principal | ICD-10-CM

## 2019-07-01 DIAGNOSIS — Q796 Ehlers-Danlos syndrome, unspecified: Principal | ICD-10-CM

## 2019-07-08 MED FILL — TOPIRAMATE 25 MG TABLET: 30 days supply | Qty: 60 | Fill #0 | Status: AC

## 2019-07-08 MED FILL — METHOTREXATE SODIUM 2.5 MG TABLET: ORAL | 56 days supply | Qty: 64 | Fill #1

## 2019-07-08 MED FILL — OMEPRAZOLE 20 MG CAPSULE,DELAYED RELEASE: 90 days supply | Qty: 90 | Fill #2 | Status: AC

## 2019-07-08 MED FILL — OMEPRAZOLE 20 MG CAPSULE,DELAYED RELEASE: ORAL | 90 days supply | Qty: 90 | Fill #2

## 2019-07-08 MED FILL — METHOTREXATE SODIUM 2.5 MG TABLET: 56 days supply | Qty: 64 | Fill #1 | Status: AC

## 2019-07-16 ENCOUNTER — Other Ambulatory Visit: Payer: Self-pay

## 2019-07-16 ENCOUNTER — Encounter: Payer: Self-pay | Admitting: Emergency Medicine

## 2019-07-16 DIAGNOSIS — R1084 Generalized abdominal pain: Secondary | ICD-10-CM | POA: Insufficient documentation

## 2019-07-16 DIAGNOSIS — Z5321 Procedure and treatment not carried out due to patient leaving prior to being seen by health care provider: Secondary | ICD-10-CM | POA: Insufficient documentation

## 2019-07-16 DIAGNOSIS — N898 Other specified noninflammatory disorders of vagina: Secondary | ICD-10-CM | POA: Insufficient documentation

## 2019-07-16 LAB — URINALYSIS, COMPLETE (UACMP) WITH MICROSCOPIC
Bacteria, UA: NONE SEEN
Bilirubin Urine: NEGATIVE
Glucose, UA: NEGATIVE mg/dL
Ketones, ur: NEGATIVE mg/dL
Leukocytes,Ua: NEGATIVE
Nitrite: NEGATIVE
Protein, ur: NEGATIVE mg/dL
Specific Gravity, Urine: 1.02 (ref 1.005–1.030)
pH: 5 (ref 5.0–8.0)

## 2019-07-16 LAB — COMPREHENSIVE METABOLIC PANEL
ALT: 15 U/L (ref 0–44)
AST: 18 U/L (ref 15–41)
Albumin: 3.9 g/dL (ref 3.5–5.0)
Alkaline Phosphatase: 86 U/L (ref 38–126)
Anion gap: 9 (ref 5–15)
BUN: 13 mg/dL (ref 6–20)
CO2: 23 mmol/L (ref 22–32)
Calcium: 8.9 mg/dL (ref 8.9–10.3)
Chloride: 106 mmol/L (ref 98–111)
Creatinine, Ser: 0.93 mg/dL (ref 0.44–1.00)
GFR calc Af Amer: >60 mL/min (ref >60)
GFR calc non Af Amer: >60 mL/min (ref >60)
Glucose, Bld: 89 mg/dL (ref 70–99)
Potassium: 3.8 mmol/L (ref 3.5–5.1)
Sodium: 138 mmol/L (ref 135–145)
Total Bilirubin: 0.6 mg/dL (ref 0.3–1.2)
Total Protein: 7.4 g/dL (ref 6.5–8.1)

## 2019-07-16 LAB — POCT PREGNANCY, URINE: Preg Test, Ur: NEGATIVE

## 2019-07-16 LAB — CBC
HCT: 38.8 % (ref 36.0–46.0)
Hemoglobin: 13.1 g/dL (ref 12.0–15.0)
MCH: 33.2 pg (ref 26.0–34.0)
MCHC: 33.8 g/dL (ref 30.0–36.0)
MCV: 98.2 fL (ref 80.0–100.0)
Platelets: 215 10*3/uL (ref 150–400)
RBC: 3.95 MIL/uL (ref 3.87–5.11)
RDW: 14.2 % (ref 11.5–15.5)
WBC: 9.9 10*3/uL (ref 4.0–10.5)
nRBC: 0 % (ref 0.0–0.2)

## 2019-07-16 LAB — LIPASE, BLOOD: Lipase: 28 U/L (ref 11–51)

## 2019-07-16 NOTE — ED Triage Notes (Signed)
Pt presents to ED with generalized abd pain for the past 2 days. Pt states she has been having difficulty urinating with increasing pressure and cramping in her lower abd. Pt reports having a bloody vaginal discharge but states its not time for her period. scheduled for a hernia repair Monday.

## 2019-07-17 ENCOUNTER — Emergency Department
Admission: EM | Admit: 2019-07-17 | Discharge: 2019-07-17 | Disposition: A | Discharge status: Home / Self Care | Payer: Self-pay | Attending: Emergency Medicine | Admitting: Emergency Medicine

## 2019-07-17 NOTE — ED Notes (Signed)
No answer when called several times from lobby 

## 2019-07-17 NOTE — ED Notes (Signed)
No answr when called several times from lobby 

## 2019-07-18 ENCOUNTER — Ambulatory Visit: Admit: 2019-07-18 | Discharge: 2019-07-19 | Attending: Family | Primary: Family

## 2019-07-21 ENCOUNTER — Encounter
Admit: 2019-07-21 | Discharge: 2019-07-30 | Disposition: A | Discharge status: Home / Self Care | Payer: MEDICAID | Attending: Anesthesiology

## 2019-07-21 ENCOUNTER — Ambulatory Visit: Admit: 2019-07-21 | Discharge: 2019-07-30 | Disposition: A | Discharge status: Home / Self Care | Payer: MEDICAID

## 2019-07-21 ENCOUNTER — Encounter
Admit: 2019-07-21 | Discharge: 2019-07-30 | Disposition: A | Discharge status: Home / Self Care | Payer: MEDICAID | Attending: Student in an Organized Health Care Education/Training Program | Primary: Student in an Organized Health Care Education/Training Program

## 2019-07-30 MED ORDER — CYCLOBENZAPRINE 5 MG TABLET
ORAL_TABLET | 0 refills | 0 days | Status: CP
Start: 2019-07-30 — End: ?
  Filled 2019-07-30: qty 30, 5d supply, fill #0

## 2019-07-30 MED ORDER — HYDROMORPHONE 4 MG TABLET
ORAL_TABLET | 0 refills | 0 days | Status: CP
Start: 2019-07-30 — End: ?
  Filled 2019-07-30: qty 25, 4d supply, fill #0

## 2019-07-30 MED FILL — CYCLOBENZAPRINE 5 MG TABLET: 5 days supply | Qty: 30 | Fill #0 | Status: AC

## 2019-07-30 MED FILL — HYDROMORPHONE 4 MG TABLET: 4 days supply | Qty: 25 | Fill #0 | Status: AC

## 2019-08-04 DIAGNOSIS — N63 Unspecified lump in unspecified breast: Principal | ICD-10-CM

## 2019-08-07 ENCOUNTER — Ambulatory Visit: Admit: 2019-08-07 | Discharge: 2019-08-07 | Disposition: A | Discharge status: Home / Self Care | Payer: MEDICAID

## 2019-08-07 DIAGNOSIS — T7840XA Allergy, unspecified, initial encounter: Principal | ICD-10-CM

## 2019-08-07 DIAGNOSIS — J069 Acute upper respiratory infection, unspecified: Principal | ICD-10-CM

## 2019-08-07 MED ORDER — PREDNISONE 20 MG TABLET
ORAL_TABLET | Freq: Every day | ORAL | 0 refills | 5 days | Status: CP
Start: 2019-08-07 — End: 2019-08-12

## 2019-08-07 MED FILL — TRUVADA 200 MG-300 MG TABLET: 30 days supply | Qty: 30 | Fill #7 | Status: AC

## 2019-08-07 MED FILL — TRUVADA 200 MG-300 MG TABLET: ORAL | 30 days supply | Qty: 30 | Fill #7

## 2019-08-20 ENCOUNTER — Telehealth: Admit: 2019-08-20 | Discharge: 2019-08-21

## 2019-08-20 DIAGNOSIS — F332 Major depressive disorder, recurrent severe without psychotic features: Principal | ICD-10-CM

## 2019-08-20 MED ORDER — VENLAFAXINE 75 MG TABLET
ORAL_TABLET | Freq: Two times a day (BID) | ORAL | 2 refills | 30 days | Status: CP
Start: 2019-08-20 — End: 2019-11-18
  Filled 2019-09-11: qty 120, 30d supply, fill #0

## 2019-09-03 ENCOUNTER — Ambulatory Visit: Admit: 2019-09-03 | Discharge: 2019-09-04

## 2019-09-03 DIAGNOSIS — Z932 Ileostomy status: Principal | ICD-10-CM

## 2019-09-03 DIAGNOSIS — K56699 Other intestinal obstruction unspecified as to partial versus complete obstruction: Principal | ICD-10-CM

## 2019-09-11 MED FILL — BUSPIRONE 10 MG TABLET: 90 days supply | Qty: 180 | Fill #1 | Status: AC

## 2019-09-11 MED FILL — SYNTHROID 50 MCG TABLET: ORAL | 90 days supply | Qty: 45 | Fill #3

## 2019-09-11 MED FILL — TRUVADA 200 MG-300 MG TABLET: 30 days supply | Qty: 30 | Fill #8 | Status: AC

## 2019-09-11 MED FILL — TOPIRAMATE 25 MG TABLET: ORAL | 30 days supply | Qty: 60 | Fill #1

## 2019-09-11 MED FILL — ONDANSETRON HCL 4 MG TABLET: 30 days supply | Qty: 30 | Fill #2 | Status: AC

## 2019-09-11 MED FILL — ONDANSETRON HCL 4 MG TABLET: ORAL | 30 days supply | Qty: 30 | Fill #2

## 2019-09-11 MED FILL — FOLIC ACID 1 MG TABLET: ORAL | 90 days supply | Qty: 180 | Fill #2

## 2019-09-11 MED FILL — BUSPIRONE 10 MG TABLET: ORAL | 90 days supply | Qty: 180 | Fill #1

## 2019-09-11 MED FILL — TRUVADA 200 MG-300 MG TABLET: ORAL | 30 days supply | Qty: 30 | Fill #8

## 2019-09-11 MED FILL — FOLIC ACID 1 MG TABLET: 90 days supply | Qty: 180 | Fill #2 | Status: AC

## 2019-09-11 MED FILL — TOPIRAMATE 25 MG TABLET: 30 days supply | Qty: 60 | Fill #1 | Status: AC

## 2019-09-11 MED FILL — SYNTHROID 50 MCG TABLET: 90 days supply | Qty: 45 | Fill #3 | Status: AC

## 2019-09-11 MED FILL — VENLAFAXINE 75 MG TABLET: 30 days supply | Qty: 120 | Fill #0 | Status: AC

## 2019-09-17 ENCOUNTER — Telehealth: Admit: 2019-09-17 | Discharge: 2019-09-18

## 2019-09-17 DIAGNOSIS — F411 Generalized anxiety disorder: Principal | ICD-10-CM

## 2019-09-18 ENCOUNTER — Ambulatory Visit: Admit: 2019-09-18

## 2019-09-22 ENCOUNTER — Ambulatory Visit: Admit: 2019-09-22

## 2019-10-07 ENCOUNTER — Ambulatory Visit: Admit: 2019-10-07 | Discharge: 2019-10-08 | Attending: Internal Medicine | Primary: Internal Medicine

## 2019-10-07 DIAGNOSIS — K56699 Other intestinal obstruction unspecified as to partial versus complete obstruction: Principal | ICD-10-CM

## 2019-10-07 DIAGNOSIS — F329 Major depressive disorder, single episode, unspecified: Principal | ICD-10-CM

## 2019-10-07 DIAGNOSIS — R609 Edema, unspecified: Principal | ICD-10-CM

## 2019-10-07 DIAGNOSIS — D649 Anemia, unspecified: Principal | ICD-10-CM

## 2019-10-07 DIAGNOSIS — Z6841 Body Mass Index (BMI) 40.0 and over, adult: Secondary | ICD-10-CM

## 2019-10-07 MED ORDER — PHENTERMINE 30 MG CAPSULE
Freq: Every morning | ORAL | 0 days
Start: 2019-10-07 — End: ?

## 2019-10-08 ENCOUNTER — Ambulatory Visit: Admit: 2019-10-08 | Discharge: 2019-10-08

## 2019-10-08 DIAGNOSIS — K56699 Other intestinal obstruction unspecified as to partial versus complete obstruction: Principal | ICD-10-CM

## 2019-10-09 MED ORDER — FUROSEMIDE 20 MG TABLET
ORAL_TABLET | Freq: Every day | ORAL | 0 refills | 7.00000 days | Status: CP
Start: 2019-10-09 — End: 2019-10-16
  Filled 2019-10-31: qty 7, 7d supply, fill #0

## 2019-10-14 ENCOUNTER — Ambulatory Visit: Admit: 2019-10-14 | Discharge: 2019-10-15

## 2019-10-14 DIAGNOSIS — L02416 Cutaneous abscess of left lower limb: Principal | ICD-10-CM

## 2019-10-14 MED ORDER — MUPIROCIN 2 % TOPICAL OINTMENT
Freq: Two times a day (BID) | TOPICAL | 3 refills | 0 days | Status: CP
Start: 2019-10-14 — End: 2019-11-13
  Filled 2019-10-20: qty 66, 30d supply, fill #0

## 2019-10-14 MED ORDER — CEPHALEXIN 500 MG CAPSULE
ORAL_CAPSULE | Freq: Three times a day (TID) | ORAL | 0 refills | 10.00000 days | Status: CP
Start: 2019-10-14 — End: 2019-10-24
  Filled 2019-10-15: qty 30, 10d supply, fill #0

## 2019-10-15 MED FILL — CEPHALEXIN 500 MG CAPSULE: 10 days supply | Qty: 30 | Fill #0 | Status: AC

## 2019-10-20 MED FILL — MUPIROCIN 2 % TOPICAL OINTMENT: 30 days supply | Qty: 66 | Fill #0 | Status: AC

## 2019-10-24 ENCOUNTER — Ambulatory Visit: Admit: 2019-10-24 | Discharge: 2019-10-25 | Attending: Internal Medicine | Primary: Internal Medicine

## 2019-10-24 DIAGNOSIS — R310 Gross hematuria: Principal | ICD-10-CM

## 2019-10-24 DIAGNOSIS — Z79899 Other long term (current) drug therapy: Principal | ICD-10-CM

## 2019-10-24 DIAGNOSIS — R350 Frequency of micturition: Principal | ICD-10-CM

## 2019-10-24 MED ORDER — EMTRICITABINE 200 MG-TENOFOVIR DISOPROXIL FUMARATE 300 MG TABLET
ORAL_TABLET | Freq: Every day | ORAL | 3 refills | 90 days | Status: CP
Start: 2019-10-24 — End: 2020-10-23
  Filled 2019-10-31: qty 30, 30d supply, fill #0

## 2019-10-24 MED ORDER — NITROFURANTOIN MONOHYDRATE/MACROCRYSTALS 100 MG CAPSULE
ORAL_CAPSULE | Freq: Two times a day (BID) | ORAL | 0 refills | 5 days | Status: CP
Start: 2019-10-24 — End: 2019-10-29

## 2019-10-30 MED ORDER — PHENTERMINE 30 MG CAPSULE
ORAL_CAPSULE | Freq: Every morning | ORAL | 0 refills | 30 days
Start: 2019-10-30 — End: ?

## 2019-10-31 MED FILL — ONDANSETRON HCL 4 MG TABLET: ORAL | 30 days supply | Qty: 30 | Fill #3

## 2019-10-31 MED FILL — TRUVADA 200 MG-300 MG TABLET: 30 days supply | Qty: 30 | Fill #0 | Status: AC

## 2019-10-31 MED FILL — ONDANSETRON HCL 4 MG TABLET: 30 days supply | Qty: 30 | Fill #3 | Status: AC

## 2019-10-31 MED FILL — FUROSEMIDE 20 MG TABLET: 7 days supply | Qty: 7 | Fill #0 | Status: AC

## 2019-10-31 MED FILL — OMEPRAZOLE 20 MG CAPSULE,DELAYED RELEASE: ORAL | 90 days supply | Qty: 90 | Fill #3

## 2019-10-31 MED FILL — OMEPRAZOLE 20 MG CAPSULE,DELAYED RELEASE: 90 days supply | Qty: 90 | Fill #3 | Status: AC

## 2019-11-13 ENCOUNTER — Ambulatory Visit
Admit: 2019-11-13 | Discharge: 2019-11-14 | Payer: MEDICAID | Attending: Physician Assistant | Primary: Physician Assistant

## 2019-11-18 ENCOUNTER — Telehealth: Admit: 2019-11-18 | Discharge: 2019-11-19

## 2019-11-18 DIAGNOSIS — F411 Generalized anxiety disorder: Principal | ICD-10-CM

## 2019-11-18 MED ORDER — BUSPIRONE 10 MG TABLET
ORAL_TABLET | Freq: Three times a day (TID) | ORAL | 3 refills | 90 days | Status: CP
Start: 2019-11-18 — End: 2020-11-17

## 2019-11-19 ENCOUNTER — Ambulatory Visit: Admit: 2019-11-19 | Discharge: 2019-11-20

## 2019-11-19 DIAGNOSIS — L97122 Non-pressure chronic ulcer of left thigh with fat layer exposed: Principal | ICD-10-CM

## 2019-11-19 DIAGNOSIS — L02416 Cutaneous abscess of left lower limb: Principal | ICD-10-CM

## 2019-11-24 MED ORDER — DOXYCYCLINE HYCLATE 100 MG TABLET
ORAL_TABLET | Freq: Two times a day (BID) | ORAL | 0 refills | 10.00000 days | Status: CP
Start: 2019-11-24 — End: 2019-12-03

## 2019-12-03 ENCOUNTER — Ambulatory Visit: Admit: 2019-12-03 | Discharge: 2019-12-04 | Attending: Vascular Surgery | Primary: Vascular Surgery

## 2019-12-03 DIAGNOSIS — L97122 Non-pressure chronic ulcer of left thigh with fat layer exposed: Principal | ICD-10-CM

## 2019-12-03 DIAGNOSIS — L02416 Cutaneous abscess of left lower limb: Principal | ICD-10-CM

## 2019-12-03 MED ORDER — SODIUM HYPOCHLORITE 0.125 % SOLUTION
Freq: Every day | TOPICAL | 0 refills | 0 days | Status: CP
Start: 2019-12-03 — End: 2020-01-27

## 2019-12-03 MED ORDER — DOXYCYCLINE HYCLATE 100 MG TABLET
ORAL_TABLET | Freq: Two times a day (BID) | ORAL | 0 refills | 10.00000 days | Status: CP
Start: 2019-12-03 — End: 2019-12-13

## 2019-12-15 ENCOUNTER — Ambulatory Visit: Admit: 2019-12-15 | Discharge: 2019-12-16 | Attending: Internal Medicine | Primary: Internal Medicine

## 2019-12-15 DIAGNOSIS — F32A Depression, unspecified depression type: Principal | ICD-10-CM

## 2019-12-15 DIAGNOSIS — Z206 Contact with and (suspected) exposure to human immunodeficiency virus [HIV]: Principal | ICD-10-CM

## 2019-12-15 DIAGNOSIS — Z6841 Body Mass Index (BMI) 40.0 and over, adult: Principal | ICD-10-CM

## 2019-12-15 MED ORDER — PHENTERMINE 30 MG CAPSULE
ORAL_CAPSULE | Freq: Every morning | ORAL | 2 refills | 30.00000 days | Status: CP
Start: 2019-12-15 — End: 2019-12-15
  Filled 2020-01-13: qty 30, 30d supply, fill #0

## 2019-12-15 MED ORDER — TOPIRAMATE 25 MG TABLET: tablet | 2 refills | 0 days | Status: AC

## 2019-12-15 MED ORDER — TOPIRAMATE 25 MG TABLET
ORAL_TABLET | 2 refills | 0.00000 days | Status: CP
Start: 2019-12-15 — End: 2019-12-15
  Filled 2020-01-13: qty 120, 30d supply, fill #0

## 2019-12-15 MED ORDER — PHENTERMINE 30 MG CAPSULE: 30 mg | capsule | Freq: Every morning | 2 refills | 30 days

## 2019-12-17 ENCOUNTER — Ambulatory Visit: Admit: 2019-12-17 | Discharge: 2019-12-18

## 2019-12-17 DIAGNOSIS — L97122 Non-pressure chronic ulcer of left thigh with fat layer exposed: Principal | ICD-10-CM

## 2019-12-17 DIAGNOSIS — L02416 Cutaneous abscess of left lower limb: Principal | ICD-10-CM

## 2019-12-22 ENCOUNTER — Ambulatory Visit: Admit: 2019-12-22 | Discharge: 2019-12-23

## 2019-12-22 DIAGNOSIS — N63 Unspecified lump in unspecified breast: Principal | ICD-10-CM

## 2019-12-22 DIAGNOSIS — Z206 Contact with and (suspected) exposure to human immunodeficiency virus [HIV]: Principal | ICD-10-CM

## 2019-12-24 ENCOUNTER — Ambulatory Visit: Admit: 2019-12-24 | Discharge: 2019-12-24 | Attending: Vascular Surgery | Primary: Vascular Surgery

## 2019-12-24 ENCOUNTER — Ambulatory Visit: Admit: 2019-12-24 | Discharge: 2019-12-24

## 2019-12-24 DIAGNOSIS — L02416 Cutaneous abscess of left lower limb: Principal | ICD-10-CM

## 2019-12-25 ENCOUNTER — Telehealth: Admit: 2019-12-25 | Discharge: 2019-12-26

## 2019-12-25 ENCOUNTER — Ambulatory Visit: Admit: 2019-12-25 | Discharge: 2019-12-26

## 2019-12-25 DIAGNOSIS — M722 Plantar fascial fibromatosis: Principal | ICD-10-CM

## 2019-12-25 DIAGNOSIS — F411 Generalized anxiety disorder: Principal | ICD-10-CM

## 2019-12-25 MED ORDER — VENLAFAXINE 75 MG TABLET
ORAL_TABLET | Freq: Every day | ORAL | 4 refills | 30 days | Status: CP
Start: 2019-12-25 — End: 2020-05-23
  Filled 2020-01-13: qty 100, 20d supply, fill #0

## 2019-12-25 MED ORDER — PREDNISONE 50 MG TABLET
ORAL_TABLET | Freq: Every day | ORAL | 0 refills | 5 days | Status: CP
Start: 2019-12-25 — End: 2020-01-27

## 2019-12-25 MED ORDER — BUSPIRONE 10 MG TABLET
ORAL_TABLET | Freq: Three times a day (TID) | ORAL | 11 refills | 30 days | Status: CP
Start: 2019-12-25 — End: 2020-12-24
  Filled 2020-01-13: qty 180, 30d supply, fill #0

## 2019-12-26 DIAGNOSIS — Z8739 Personal history of other diseases of the musculoskeletal system and connective tissue: Principal | ICD-10-CM

## 2019-12-26 MED ORDER — HUMIRA PEN CITRATE FREE 40 MG/0.4 ML
SUBCUTANEOUS | 2 refills | 84.00000 days | Status: CP
Start: 2019-12-26 — End: ?

## 2019-12-30 DIAGNOSIS — Z8739 Personal history of other diseases of the musculoskeletal system and connective tissue: Principal | ICD-10-CM

## 2020-01-05 ENCOUNTER — Ambulatory Visit: Admit: 2020-01-05 | Discharge: 2020-01-06 | Attending: Rheumatology | Primary: Rheumatology

## 2020-01-05 DIAGNOSIS — M7580 Other shoulder lesions, unspecified shoulder: Principal | ICD-10-CM

## 2020-01-05 DIAGNOSIS — Z8739 Personal history of other diseases of the musculoskeletal system and connective tissue: Principal | ICD-10-CM

## 2020-01-05 DIAGNOSIS — D508 Other iron deficiency anemias: Principal | ICD-10-CM

## 2020-01-05 DIAGNOSIS — Z5181 Encounter for therapeutic drug level monitoring: Principal | ICD-10-CM

## 2020-01-05 DIAGNOSIS — Q7961 Classical Ehlers-Danlos syndrome: Principal | ICD-10-CM

## 2020-01-05 MED ORDER — FERROUS SULFATE 325 MG (65 MG IRON) TABLET
ORAL_TABLET | ORAL | 0 refills | 90 days | Status: CP
Start: 2020-01-05 — End: 2021-01-04
  Filled 2020-01-13: qty 15, 30d supply, fill #0

## 2020-01-05 MED ORDER — CELECOXIB 200 MG CAPSULE
ORAL_CAPSULE | Freq: Every day | ORAL | 4 refills | 60 days | Status: CP | PRN
Start: 2020-01-05 — End: 2021-01-04
  Filled 2020-01-13: qty 30, 30d supply, fill #0

## 2020-01-13 MED FILL — TRUVADA 200 MG-300 MG TABLET: ORAL | 30 days supply | Qty: 30 | Fill #0

## 2020-01-13 MED FILL — FOLIC ACID 1 MG TABLET: ORAL | 30 days supply | Qty: 60 | Fill #0

## 2020-01-13 MED FILL — VENLAFAXINE 75 MG TABLET: 20 days supply | Qty: 100 | Fill #0 | Status: AC

## 2020-01-13 MED FILL — CELECOXIB 200 MG CAPSULE: 30 days supply | Qty: 30 | Fill #0 | Status: AC

## 2020-01-13 MED FILL — TOPIRAMATE 25 MG TABLET: 30 days supply | Qty: 120 | Fill #0 | Status: AC

## 2020-01-13 MED FILL — TRUVADA 200 MG-300 MG TABLET: 30 days supply | Qty: 30 | Fill #0 | Status: AC

## 2020-01-13 MED FILL — FEROSUL 325 MG (65 MG IRON) TABLET: 30 days supply | Qty: 15 | Fill #0 | Status: AC

## 2020-01-13 MED FILL — PHENTERMINE 30 MG CAPSULE: 30 days supply | Qty: 30 | Fill #0 | Status: AC

## 2020-01-13 MED FILL — FOLIC ACID 1 MG TABLET: 30 days supply | Qty: 60 | Fill #0 | Status: AC

## 2020-01-13 MED FILL — BUSPIRONE 10 MG TABLET: 30 days supply | Qty: 180 | Fill #0 | Status: AC

## 2020-01-14 MED ORDER — OMEPRAZOLE 20 MG CAPSULE,DELAYED RELEASE
ORAL_CAPSULE | Freq: Every day | ORAL | 0 refills | 30.00000 days | Status: CP
Start: 2020-01-14 — End: 2021-01-13
  Filled 2020-01-28: qty 30, 30d supply, fill #0

## 2020-01-14 MED ORDER — SYNTHROID 50 MCG TABLET
ORAL_TABLET | Freq: Every day | ORAL | 0 refills | 90.00000 days | Status: CP
Start: 2020-01-14 — End: 2020-01-27

## 2020-01-26 MED ORDER — OSTOMY SUPPLIES 5/8"
Freq: Every day | 0 refills | 0 days | Status: CP
Start: 2020-01-26 — End: 2020-01-29

## 2020-01-26 MED ORDER — OSTOMY SUPPLIES
0 refills | 0 days | Status: CP
Start: 2020-01-26 — End: 2020-01-29

## 2020-01-26 MED ORDER — COLOSTOMY BAG, NON-STERILE 2 3/8" (12")
Freq: Every day | 0 refills | 0 days | Status: CP
Start: 2020-01-26 — End: 2020-01-29

## 2020-01-27 ENCOUNTER — Ambulatory Visit: Admit: 2020-01-27 | Discharge: 2020-01-28

## 2020-01-27 ENCOUNTER — Ambulatory Visit
Admit: 2020-01-27 | Discharge: 2020-01-28 | Attending: Student in an Organized Health Care Education/Training Program | Primary: Student in an Organized Health Care Education/Training Program

## 2020-01-27 DIAGNOSIS — E119 Type 2 diabetes mellitus without complications: Principal | ICD-10-CM

## 2020-01-27 DIAGNOSIS — R110 Nausea: Principal | ICD-10-CM

## 2020-01-27 DIAGNOSIS — Z885 Allergy status to narcotic agent status: Principal | ICD-10-CM

## 2020-01-27 DIAGNOSIS — E079 Disorder of thyroid, unspecified: Principal | ICD-10-CM

## 2020-01-27 DIAGNOSIS — Z932 Ileostomy status: Principal | ICD-10-CM

## 2020-01-27 DIAGNOSIS — Z433 Encounter for attention to colostomy: Principal | ICD-10-CM

## 2020-01-27 DIAGNOSIS — K559 Vascular disorder of intestine, unspecified: Principal | ICD-10-CM

## 2020-01-27 DIAGNOSIS — K56699 Other intestinal obstruction unspecified as to partial versus complete obstruction: Principal | ICD-10-CM

## 2020-01-27 DIAGNOSIS — Z9189 Other specified personal risk factors, not elsewhere classified: Principal | ICD-10-CM

## 2020-01-27 DIAGNOSIS — Z1231 Encounter for screening mammogram for malignant neoplasm of breast: Principal | ICD-10-CM

## 2020-01-27 DIAGNOSIS — Z780 Asymptomatic menopausal state: Principal | ICD-10-CM

## 2020-01-27 DIAGNOSIS — N939 Abnormal uterine and vaginal bleeding, unspecified: Principal | ICD-10-CM

## 2020-01-27 DIAGNOSIS — Q796 Ehlers-Danlos syndrome, unspecified: Principal | ICD-10-CM

## 2020-01-27 DIAGNOSIS — B370 Candidal stomatitis: Principal | ICD-10-CM

## 2020-01-27 DIAGNOSIS — M1380 Other specified arthritis, unspecified site: Principal | ICD-10-CM

## 2020-01-27 DIAGNOSIS — Z0000 Encounter for general adult medical examination without abnormal findings: Principal | ICD-10-CM

## 2020-01-27 DIAGNOSIS — Z794 Long term (current) use of insulin: Principal | ICD-10-CM

## 2020-01-27 DIAGNOSIS — Z6841 Body Mass Index (BMI) 40.0 and over, adult: Principal | ICD-10-CM

## 2020-01-27 DIAGNOSIS — M064 Inflammatory polyarthropathy: Principal | ICD-10-CM

## 2020-01-27 DIAGNOSIS — C539 Malignant neoplasm of cervix uteri, unspecified: Principal | ICD-10-CM

## 2020-01-27 DIAGNOSIS — Z5941 Food insecurity: Principal | ICD-10-CM

## 2020-01-27 DIAGNOSIS — F410 Panic disorder [episodic paroxysmal anxiety] without agoraphobia: Principal | ICD-10-CM

## 2020-01-27 DIAGNOSIS — N879 Dysplasia of cervix uteri, unspecified: Principal | ICD-10-CM

## 2020-01-27 DIAGNOSIS — E785 Hyperlipidemia, unspecified: Principal | ICD-10-CM

## 2020-01-27 DIAGNOSIS — E038 Other specified hypothyroidism: Principal | ICD-10-CM

## 2020-01-27 MED ORDER — NYSTATIN 100,000 UNIT/ML ORAL SUSPENSION
Freq: Four times a day (QID) | ORAL | 0 refills | 7.00000 days | Status: CP
Start: 2020-01-27 — End: 2020-02-04
  Filled 2020-01-28: qty 140, 7d supply, fill #0

## 2020-01-27 MED ORDER — ONDANSETRON HCL 4 MG TABLET
ORAL_TABLET | Freq: Every day | ORAL | 3 refills | 30 days | Status: CP | PRN
Start: 2020-01-27 — End: 2021-01-26
  Filled 2020-01-28: qty 30, 30d supply, fill #0

## 2020-01-27 MED ORDER — LEVOTHYROXINE 75 MCG TABLET
ORAL_TABLET | Freq: Every day | ORAL | 1 refills | 90 days | Status: CP
Start: 2020-01-27 — End: 2020-07-25
  Filled 2020-01-28: qty 45, 90d supply, fill #0

## 2020-01-28 MED FILL — OMEPRAZOLE 20 MG CAPSULE,DELAYED RELEASE: 30 days supply | Qty: 30 | Fill #0 | Status: AC

## 2020-01-28 MED FILL — SYNTHROID 75 MCG TABLET: 90 days supply | Qty: 45 | Fill #0 | Status: AC

## 2020-01-28 MED FILL — ONDANSETRON HCL 4 MG TABLET: 30 days supply | Qty: 30 | Fill #0 | Status: AC

## 2020-01-28 MED FILL — NYSTATIN 100,000 UNIT/ML ORAL SUSPENSION: 7 days supply | Qty: 140 | Fill #0 | Status: AC

## 2020-01-29 DIAGNOSIS — E559 Vitamin D deficiency, unspecified: Principal | ICD-10-CM

## 2020-01-29 MED ORDER — ERGOCALCIFEROL (VITAMIN D2) 1,250 MCG (50,000 UNIT) CAPSULE
ORAL_CAPSULE | ORAL | 0 refills | 56.00000 days | Status: CP
Start: 2020-01-29 — End: 2020-04-07
  Filled 2020-02-11: qty 8, 56d supply, fill #0

## 2020-02-02 DIAGNOSIS — Z932 Ileostomy status: Principal | ICD-10-CM

## 2020-02-02 DIAGNOSIS — N879 Dysplasia of cervix uteri, unspecified: Principal | ICD-10-CM

## 2020-02-06 ENCOUNTER — Ambulatory Visit: Admit: 2020-02-06 | Discharge: 2020-02-07

## 2020-02-06 DIAGNOSIS — H8120 Vestibular neuronitis, unspecified ear: Principal | ICD-10-CM

## 2020-02-06 MED ORDER — MECLIZINE 25 MG TABLET
ORAL_TABLET | Freq: Three times a day (TID) | ORAL | 0 refills | 10 days | Status: CP | PRN
Start: 2020-02-06 — End: ?
  Filled 2020-02-06: qty 30, 10d supply, fill #0

## 2020-02-06 MED ORDER — FOLIC ACID 1 MG TABLET
ORAL_TABLET | Freq: Every day | ORAL | 3 refills | 90.00000 days | Status: CP
Start: 2020-02-06 — End: ?
  Filled 2020-02-11: qty 180, 90d supply, fill #0

## 2020-02-06 MED ORDER — PREDNISONE 20 MG TABLET
ORAL_TABLET | 0 refills | 0 days | Status: CP
Start: 2020-02-06 — End: 2020-03-01
  Filled 2020-02-06: qty 12, 6d supply, fill #0

## 2020-02-10 DIAGNOSIS — Z932 Ileostomy status: Principal | ICD-10-CM

## 2020-02-10 MED ORDER — METHOTREXATE SODIUM 2.5 MG TABLET
ORAL_TABLET | ORAL | 3 refills | 21.00000 days | Status: CP
Start: 2020-02-10 — End: 2020-03-11
  Filled 2020-02-11: qty 24, 21d supply, fill #0

## 2020-02-10 MED ORDER — OSTOMY SUPPLIES
0 refills | 0 days | Status: CP
Start: 2020-02-10 — End: ?

## 2020-02-11 MED FILL — BUSPIRONE 10 MG TABLET: ORAL | 30 days supply | Qty: 180 | Fill #0

## 2020-02-11 MED FILL — CELECOXIB 200 MG CAPSULE: ORAL | 60 days supply | Qty: 60 | Fill #0

## 2020-02-11 MED FILL — TOPIRAMATE 25 MG TABLET: 30 days supply | Qty: 120 | Fill #0

## 2020-02-11 MED FILL — PHENTERMINE 30 MG CAPSULE: ORAL | 30 days supply | Qty: 30 | Fill #1

## 2020-02-11 MED FILL — FEROSUL 325 MG (65 MG IRON) TABLET: ORAL | 60 days supply | Qty: 30 | Fill #0

## 2020-02-11 MED FILL — TRUVADA 200 MG-300 MG TABLET: ORAL | 30 days supply | Qty: 30 | Fill #0

## 2020-02-12 DIAGNOSIS — Z932 Ileostomy status: Principal | ICD-10-CM

## 2020-02-12 MED ORDER — SENSI-CARE ADHESIVE REMOVER WIPE
11 refills | 0 days | Status: CP
Start: 2020-02-12 — End: ?
  Filled 2020-02-17: qty 30, 30d supply, fill #0

## 2020-02-12 MED ORDER — SENSURA FLEX OSTOMY POUCH
11 refills | 0 days | Status: CP
Start: 2020-02-12 — End: ?

## 2020-02-12 MED ORDER — OSTOMY SUPPLIES POWDER
11 refills | 0 days | Status: CP
Start: 2020-02-12 — End: ?

## 2020-02-12 MED ORDER — OSTOMY SUPPLIES
0 refills | 0.00000 days | Status: CP
Start: 2020-02-12 — End: 2020-02-12

## 2020-02-12 MED ORDER — ASSURA OSTOMY BELT
11 refills | 0 days | Status: CP
Start: 2020-02-12 — End: ?

## 2020-02-12 MED ORDER — COLOPLAST BARRIER RING 2"
11 refills | 0 days | Status: CP
Start: 2020-02-12 — End: ?

## 2020-02-12 MED ORDER — NO STING BARRIER FILM PADS
11 refills | 0 days | Status: CP
Start: 2020-02-12 — End: ?

## 2020-02-13 MED ORDER — OSTOMY SUPPLIES
11 refills | 0 days | Status: CP
Start: 2020-02-13 — End: ?

## 2020-02-17 MED FILL — NO STING BARRIER FILM PADS: 30 days supply | Qty: 30 | Fill #0

## 2020-02-17 MED FILL — ASSURA OSTOMY BELT: 90 days supply | Qty: 3 | Fill #0

## 2020-02-18 MED FILL — ADAPT STOMA POWDER TOPICAL: 30 days supply | Qty: 56.6 | Fill #0

## 2020-02-18 MED FILL — COLOPLAST BARRIER RING 2": 30 days supply | Qty: 20 | Fill #0

## 2020-02-18 MED FILL — SENSURA FLEX OSTOMY POUCH: 30 days supply | Qty: 30 | Fill #0

## 2020-02-20 MED FILL — VENLAFAXINE 75 MG TABLET: ORAL | 30 days supply | Qty: 150 | Fill #0

## 2020-02-28 ENCOUNTER — Ambulatory Visit
Admit: 2020-02-28 | Discharge: 2020-02-28 | Disposition: A | Discharge status: Home / Self Care | Payer: MEDICAID | Attending: Emergency Medicine

## 2020-02-28 ENCOUNTER — Emergency Department
Admit: 2020-02-28 | Discharge: 2020-02-28 | Disposition: A | Discharge status: Home / Self Care | Payer: MEDICAID | Attending: Emergency Medicine

## 2020-02-28 DIAGNOSIS — Z885 Allergy status to narcotic agent status: Principal | ICD-10-CM

## 2020-02-28 DIAGNOSIS — E079 Disorder of thyroid, unspecified: Principal | ICD-10-CM

## 2020-02-28 DIAGNOSIS — Z6841 Body Mass Index (BMI) 40.0 and over, adult: Principal | ICD-10-CM

## 2020-02-28 DIAGNOSIS — R0602 Shortness of breath: Principal | ICD-10-CM

## 2020-02-28 DIAGNOSIS — R059 Cough: Principal | ICD-10-CM

## 2020-02-28 DIAGNOSIS — R509 Fever, unspecified: Principal | ICD-10-CM

## 2020-02-28 DIAGNOSIS — U071 COVID-19: Principal | ICD-10-CM

## 2020-02-28 DIAGNOSIS — R519 Headache, unspecified: Principal | ICD-10-CM

## 2020-02-28 DIAGNOSIS — M064 Inflammatory polyarthropathy: Principal | ICD-10-CM

## 2020-02-29 DIAGNOSIS — U071 COVID-19: Principal | ICD-10-CM

## 2020-03-01 ENCOUNTER — Ambulatory Visit: Admit: 2020-03-01 | Discharge: 2020-03-02 | Payer: MEDICAID

## 2020-03-01 ENCOUNTER — Telehealth: Admit: 2020-03-01 | Discharge: 2020-03-02 | Payer: MEDICAID

## 2020-03-01 DIAGNOSIS — U071 COVID-19: Principal | ICD-10-CM

## 2020-03-02 ENCOUNTER — Ambulatory Visit: Admit: 2020-03-02 | Discharge: 2020-03-03 | Payer: MEDICAID

## 2020-03-02 DIAGNOSIS — U071 COVID: Principal | ICD-10-CM

## 2020-03-02 DIAGNOSIS — Z23 Encounter for immunization: Principal | ICD-10-CM

## 2020-03-22 ENCOUNTER — Encounter
Admit: 2020-03-22 | Discharge: 2020-03-22 | Attending: Student in an Organized Health Care Education/Training Program | Primary: Student in an Organized Health Care Education/Training Program

## 2020-03-22 ENCOUNTER — Ambulatory Visit: Admit: 2020-03-22 | Discharge: 2020-03-22

## 2020-03-23 ENCOUNTER — Ambulatory Visit: Admit: 2020-03-23

## 2020-03-29 DIAGNOSIS — Z79899 Other long term (current) drug therapy: Principal | ICD-10-CM

## 2020-03-29 MED FILL — PHENTERMINE 30 MG CAPSULE: ORAL | 30 days supply | Qty: 30 | Fill #2

## 2020-03-29 MED FILL — ONDANSETRON HCL 4 MG TABLET: ORAL | 30 days supply | Qty: 30 | Fill #1

## 2020-04-05 ENCOUNTER — Ambulatory Visit
Admit: 2020-04-05 | Discharge: 2020-04-06 | Attending: Student in an Organized Health Care Education/Training Program | Primary: Student in an Organized Health Care Education/Training Program

## 2020-04-05 DIAGNOSIS — R87810 Cervical high risk human papillomavirus (HPV) DNA test positive: Principal | ICD-10-CM

## 2020-04-05 DIAGNOSIS — N912 Amenorrhea, unspecified: Principal | ICD-10-CM

## 2020-04-05 DIAGNOSIS — N879 Dysplasia of cervix uteri, unspecified: Principal | ICD-10-CM

## 2020-04-07 DIAGNOSIS — N760 Acute vaginitis: Principal | ICD-10-CM

## 2020-04-07 DIAGNOSIS — B9689 Other specified bacterial agents as the cause of diseases classified elsewhere: Principal | ICD-10-CM

## 2020-04-07 MED ORDER — METRONIDAZOLE 500 MG TABLET
ORAL_TABLET | Freq: Two times a day (BID) | ORAL | 0 refills | 7.00000 days | Status: CP
Start: 2020-04-07 — End: 2020-04-14
  Filled 2020-04-08: qty 14, 7d supply, fill #0

## 2020-04-12 ENCOUNTER — Ambulatory Visit: Admit: 2020-04-12 | Attending: Rheumatology | Primary: Rheumatology

## 2020-04-16 MED FILL — VENLAFAXINE 75 MG TABLET: ORAL | 30 days supply | Qty: 150 | Fill #1

## 2020-04-20 MED FILL — TRUVADA 200 MG-300 MG TABLET: ORAL | 30 days supply | Qty: 30 | Fill #1

## 2020-04-23 MED FILL — METHOTREXATE SODIUM 2.5 MG TABLET: ORAL | 21 days supply | Qty: 24 | Fill #1

## 2020-05-10 ENCOUNTER — Ambulatory Visit: Admit: 2020-05-10 | Discharge: 2020-05-11 | Attending: Internal Medicine | Primary: Internal Medicine

## 2020-05-10 DIAGNOSIS — Z932 Ileostomy status: Principal | ICD-10-CM

## 2020-05-10 DIAGNOSIS — F419 Anxiety disorder, unspecified: Principal | ICD-10-CM

## 2020-05-10 DIAGNOSIS — E559 Vitamin D deficiency, unspecified: Principal | ICD-10-CM

## 2020-05-10 DIAGNOSIS — Z6841 Body Mass Index (BMI) 40.0 and over, adult: Principal | ICD-10-CM

## 2020-05-10 DIAGNOSIS — R200 Anesthesia of skin: Principal | ICD-10-CM

## 2020-05-10 DIAGNOSIS — Z9189 Other specified personal risk factors, not elsewhere classified: Principal | ICD-10-CM

## 2020-05-10 DIAGNOSIS — M1380 Other specified arthritis, unspecified site: Principal | ICD-10-CM

## 2020-05-10 DIAGNOSIS — F32A Depression, unspecified depression type: Principal | ICD-10-CM

## 2020-05-10 DIAGNOSIS — M25511 Pain in right shoulder: Principal | ICD-10-CM

## 2020-05-10 DIAGNOSIS — Z79899 Other long term (current) drug therapy: Principal | ICD-10-CM

## 2020-05-10 DIAGNOSIS — R202 Paresthesia of skin: Principal | ICD-10-CM

## 2020-05-10 DIAGNOSIS — N951 Menopausal and female climacteric states: Principal | ICD-10-CM

## 2020-05-10 DIAGNOSIS — E039 Hypothyroidism, unspecified: Principal | ICD-10-CM

## 2020-05-14 DIAGNOSIS — E038 Other specified hypothyroidism: Principal | ICD-10-CM

## 2020-05-14 DIAGNOSIS — E559 Vitamin D deficiency, unspecified: Principal | ICD-10-CM

## 2020-05-14 MED ORDER — LEVOTHYROXINE 75 MCG TABLET
ORAL_TABLET | Freq: Every day | ORAL | 3 refills | 90 days | Status: CP
Start: 2020-05-14 — End: 2021-05-14
  Filled 2020-08-06: qty 90, 90d supply, fill #0

## 2020-05-14 MED FILL — NO STING BARRIER FILM PADS: 30 days supply | Qty: 30 | Fill #1

## 2020-05-14 MED FILL — SENSI-CARE ADHESIVE REMOVER WIPE: 30 days supply | Qty: 30 | Fill #1

## 2020-05-14 MED FILL — SYNTHROID 75 MCG TABLET: ORAL | 90 days supply | Qty: 45 | Fill #1

## 2020-05-14 MED FILL — TOPIRAMATE 25 MG TABLET: 30 days supply | Qty: 120 | Fill #1

## 2020-05-17 MED ORDER — ERGOCALCIFEROL (VITAMIN D2) 1,250 MCG (50,000 UNIT) CAPSULE
ORAL_CAPSULE | ORAL | 11 refills | 56.00000 days | Status: CP
Start: 2020-05-17 — End: 2021-05-17
  Filled 2020-05-20: qty 16, 56d supply, fill #0

## 2020-05-18 MED FILL — COLOPLAST BARRIER RING 2": 30 days supply | Qty: 20 | Fill #1

## 2020-05-18 MED FILL — SENSURA FLEX OSTOMY POUCH: 30 days supply | Qty: 30 | Fill #0

## 2020-05-26 DIAGNOSIS — D508 Other iron deficiency anemias: Principal | ICD-10-CM

## 2020-05-27 MED ORDER — OMEPRAZOLE 20 MG CAPSULE,DELAYED RELEASE
ORAL_CAPSULE | Freq: Every day | ORAL | 0 refills | 30.00000 days | Status: CP
Start: 2020-05-27 — End: 2021-05-27
  Filled 2020-05-27: qty 30, 30d supply, fill #0

## 2020-05-27 MED FILL — TRUVADA 200 MG-300 MG TABLET: ORAL | 30 days supply | Qty: 30 | Fill #2

## 2020-05-28 DIAGNOSIS — D508 Other iron deficiency anemias: Principal | ICD-10-CM

## 2020-05-28 MED ORDER — FERROUS SULFATE 325 MG (65 MG IRON) TABLET
ORAL_TABLET | ORAL | 3 refills | 90.00000 days | Status: CP
Start: 2020-05-28 — End: 2021-05-28
  Filled 2020-05-28: qty 45, 90d supply, fill #0

## 2020-06-03 ENCOUNTER — Ambulatory Visit: Admit: 2020-06-03 | Discharge: 2020-06-04

## 2020-06-03 ENCOUNTER — Telehealth: Admit: 2020-06-03 | Discharge: 2020-06-04

## 2020-06-03 DIAGNOSIS — F411 Generalized anxiety disorder: Principal | ICD-10-CM

## 2020-06-03 DIAGNOSIS — N758 Other diseases of Bartholin's gland: Principal | ICD-10-CM

## 2020-06-03 MED ORDER — CLINDAMYCIN HCL 300 MG CAPSULE
ORAL_CAPSULE | Freq: Three times a day (TID) | ORAL | 0 refills | 7 days | Status: CP
Start: 2020-06-03 — End: 2020-06-10
  Filled 2020-06-03: qty 21, 7d supply, fill #0

## 2020-06-03 MED ORDER — VENLAFAXINE ER 75 MG CAPSULE,EXTENDED RELEASE 24 HR
ORAL_CAPSULE | Freq: Every day | ORAL | 1 refills | 30 days | Status: CP
Start: 2020-06-03 — End: 2021-06-03
  Filled 2020-06-17: qty 30, 30d supply, fill #0

## 2020-06-03 MED ORDER — BUSPIRONE 30 MG TABLET
ORAL_TABLET | Freq: Two times a day (BID) | ORAL | 11 refills | 30 days | Status: CP
Start: 2020-06-03 — End: 2021-06-03
  Filled 2020-06-17: qty 120, 30d supply, fill #0

## 2020-06-03 MED ORDER — VENLAFAXINE ER 150 MG CAPSULE,EXTENDED RELEASE 24 HR
ORAL_CAPSULE | Freq: Every day | ORAL | 1 refills | 30 days | Status: CP
Start: 2020-06-03 — End: 2021-06-03

## 2020-06-07 ENCOUNTER — Telehealth: Admit: 2020-06-07 | Discharge: 2020-06-08 | Attending: Internal Medicine | Primary: Internal Medicine

## 2020-06-07 DIAGNOSIS — E039 Hypothyroidism, unspecified: Principal | ICD-10-CM

## 2020-06-07 DIAGNOSIS — Z6841 Body Mass Index (BMI) 40.0 and over, adult: Principal | ICD-10-CM

## 2020-06-07 DIAGNOSIS — Q796 Ehlers-Danlos syndrome, unspecified: Principal | ICD-10-CM

## 2020-06-07 DIAGNOSIS — F32A Depression, unspecified depression type: Principal | ICD-10-CM

## 2020-06-16 ENCOUNTER — Ambulatory Visit: Admit: 2020-06-16 | Discharge: 2020-06-17

## 2020-06-16 ENCOUNTER — Ambulatory Visit
Admit: 2020-06-16 | Discharge: 2020-06-17 | Attending: Student in an Organized Health Care Education/Training Program | Primary: Student in an Organized Health Care Education/Training Program

## 2020-06-17 MED FILL — VENLAFAXINE ER 150 MG CAPSULE,EXTENDED RELEASE 24 HR: ORAL | 30 days supply | Qty: 60 | Fill #0

## 2020-06-17 MED FILL — CELECOXIB 200 MG CAPSULE: ORAL | 60 days supply | Qty: 60 | Fill #1

## 2020-06-29 ENCOUNTER — Ambulatory Visit: Admit: 2020-06-29 | Discharge: 2020-06-30

## 2020-06-29 DIAGNOSIS — Z01812 Encounter for preprocedural laboratory examination: Principal | ICD-10-CM

## 2020-06-29 DIAGNOSIS — Z20822 Encounter for preprocedure screening laboratory testing for COVID-19: Principal | ICD-10-CM

## 2020-06-30 ENCOUNTER — Institutional Professional Consult (permissible substitution): Admit: 2020-06-30 | Discharge: 2020-07-01 | Attending: Registered" | Primary: Registered"

## 2020-06-30 DIAGNOSIS — Z6841 Body Mass Index (BMI) 40.0 and over, adult: Principal | ICD-10-CM

## 2020-07-01 ENCOUNTER — Encounter
Admit: 2020-07-01 | Discharge: 2020-07-06 | Disposition: A | Discharge status: Home Health | Payer: MEDICAID | Admitting: Vascular Surgery

## 2020-07-01 ENCOUNTER — Ambulatory Visit
Admit: 2020-07-01 | Discharge: 2020-07-06 | Disposition: A | Discharge status: Home Health | Payer: MEDICAID | Admitting: Vascular Surgery

## 2020-07-05 MED ORDER — DICLOFENAC 1 % TOPICAL GEL
Freq: Four times a day (QID) | TOPICAL | 0 refills | 13 days | Status: CP | PRN
Start: 2020-07-05 — End: 2021-07-05
  Filled 2020-07-06: qty 100, 13d supply, fill #0

## 2020-07-05 MED ORDER — ACETAMINOPHEN 500 MG TABLET
ORAL_TABLET | Freq: Three times a day (TID) | ORAL | 0 refills | 5 days | Status: CP
Start: 2020-07-05 — End: ?
  Filled 2020-07-06: qty 30, 5d supply, fill #0

## 2020-07-05 MED ORDER — GABAPENTIN 100 MG CAPSULE
ORAL_CAPSULE | Freq: Three times a day (TID) | ORAL | 0 refills | 30 days | Status: CP
Start: 2020-07-05 — End: 2020-08-04
  Filled 2020-07-06: qty 180, 30d supply, fill #0

## 2020-07-05 MED ORDER — OXYCODONE 5 MG TABLET
ORAL_TABLET | ORAL | 0 refills | 5 days | Status: CP | PRN
Start: 2020-07-05 — End: 2020-07-10
  Filled 2020-07-06: qty 30, 5d supply, fill #0

## 2020-07-07 ENCOUNTER — Encounter: Admit: 2020-07-07 | Discharge: 2020-08-05

## 2020-07-07 ENCOUNTER — Inpatient Hospital Stay: Admit: 2020-07-07 | Discharge: 2020-08-05

## 2020-07-07 ENCOUNTER — Ambulatory Visit
Admit: 2020-07-07 | Attending: Rehabilitative and Restorative Service Providers" | Primary: Rehabilitative and Restorative Service Providers"

## 2020-07-07 MED ORDER — PHENTERMINE 30 MG CAPSULE
ORAL_CAPSULE | Freq: Every morning | ORAL | 2 refills | 30 days | Status: CP
Start: 2020-07-07 — End: ?
  Filled 2020-07-14: qty 30, 30d supply, fill #0

## 2020-07-08 MED ORDER — TOPIRAMATE 25 MG TABLET
ORAL_TABLET | 2 refills | 0 days | Status: CP
Start: 2020-07-08 — End: ?
  Filled 2020-07-14: qty 120, 30d supply, fill #0

## 2020-07-09 MED ORDER — OMEPRAZOLE 20 MG CAPSULE,DELAYED RELEASE
ORAL_CAPSULE | Freq: Every day | ORAL | 3 refills | 90 days | Status: CP
Start: 2020-07-09 — End: 2021-07-09
  Filled 2020-07-14: qty 90, 90d supply, fill #0

## 2020-07-12 MED ORDER — OXYCODONE 5 MG TABLET
ORAL_TABLET | Freq: Three times a day (TID) | ORAL | 0 refills | 7.00000 days | Status: CP | PRN
Start: 2020-07-12 — End: ?

## 2020-07-13 MED ORDER — OXYCODONE 5 MG TABLET
ORAL_TABLET | Freq: Three times a day (TID) | ORAL | 0 refills | 7 days | Status: CP | PRN
Start: 2020-07-13 — End: ?
  Filled 2020-07-13: qty 20, 6d supply, fill #0

## 2020-07-13 MED FILL — TRUVADA 200 MG-300 MG TABLET: ORAL | 30 days supply | Qty: 30 | Fill #3

## 2020-07-19 ENCOUNTER — Telehealth: Admit: 2020-07-19 | Discharge: 2020-07-20 | Attending: Internal Medicine | Primary: Internal Medicine

## 2020-07-19 DIAGNOSIS — Z6841 Body Mass Index (BMI) 40.0 and over, adult: Principal | ICD-10-CM

## 2020-07-19 DIAGNOSIS — F32A Depression, unspecified depression type: Principal | ICD-10-CM

## 2020-07-19 MED ORDER — TOPIRAMATE 25 MG TABLET
ORAL_TABLET | 2 refills | 0 days
Start: 2020-07-19 — End: ?

## 2020-07-28 ENCOUNTER — Ambulatory Visit: Admit: 2020-07-28 | Discharge: 2020-07-29 | Attending: Vascular Surgery | Primary: Vascular Surgery

## 2020-07-28 DIAGNOSIS — L97122 Non-pressure chronic ulcer of left thigh with fat layer exposed: Principal | ICD-10-CM

## 2020-08-06 ENCOUNTER — Encounter: Admit: 2020-08-06 | Discharge: 2020-09-04

## 2020-08-06 MED FILL — VENLAFAXINE ER 75 MG CAPSULE,EXTENDED RELEASE 24 HR: ORAL | 30 days supply | Qty: 30 | Fill #1

## 2020-08-06 MED FILL — METHOTREXATE SODIUM 2.5 MG TABLET: ORAL | 21 days supply | Qty: 24 | Fill #2

## 2020-08-06 MED FILL — VENLAFAXINE ER 150 MG CAPSULE,EXTENDED RELEASE 24 HR: ORAL | 30 days supply | Qty: 60 | Fill #1

## 2020-08-06 MED FILL — BUSPIRONE 15 MG TABLET: ORAL | 30 days supply | Qty: 120 | Fill #1

## 2020-08-06 MED FILL — ERGOCALCIFEROL (VITAMIN D2) 1,250 MCG (50,000 UNIT) CAPSULE: ORAL | 56 days supply | Qty: 16 | Fill #1

## 2020-08-18 MED FILL — TRUVADA 200 MG-300 MG TABLET: ORAL | 30 days supply | Qty: 30 | Fill #4

## 2020-09-05 ENCOUNTER — Encounter: Admit: 2020-09-05

## 2020-09-05 ENCOUNTER — Encounter: Admit: 2020-09-05 | Discharge: 2020-10-04

## 2020-09-09 MED FILL — ONDANSETRON HCL 4 MG TABLET: ORAL | 30 days supply | Qty: 30 | Fill #2

## 2020-09-09 MED FILL — FOLIC ACID 1 MG TABLET: ORAL | 90 days supply | Qty: 180 | Fill #1

## 2020-09-10 ENCOUNTER — Ambulatory Visit: Admit: 2020-09-10 | Discharge: 2020-09-11 | Attending: Vascular Surgery | Primary: Vascular Surgery

## 2020-09-15 MED ORDER — TOPIRAMATE 25 MG TABLET
ORAL_TABLET | Freq: Two times a day (BID) | ORAL | 3 refills | 30 days | Status: CP
Start: 2020-09-15 — End: ?
  Filled 2020-09-17: qty 120, 30d supply, fill #0

## 2020-09-17 MED FILL — FEROSUL 325 MG (65 MG IRON) TABLET: ORAL | 90 days supply | Qty: 45 | Fill #1

## 2020-09-17 MED FILL — BUSPIRONE 15 MG TABLET: ORAL | 30 days supply | Qty: 120 | Fill #2

## 2020-09-17 MED FILL — TRUVADA 200 MG-300 MG TABLET: ORAL | 30 days supply | Qty: 30 | Fill #5

## 2020-09-17 MED FILL — CELECOXIB 200 MG CAPSULE: ORAL | 60 days supply | Qty: 60 | Fill #2

## 2020-09-17 MED FILL — METHOTREXATE SODIUM 2.5 MG TABLET: ORAL | 21 days supply | Qty: 24 | Fill #3

## 2020-09-20 ENCOUNTER — Telehealth: Admit: 2020-09-20 | Discharge: 2020-09-21

## 2020-09-20 DIAGNOSIS — F411 Generalized anxiety disorder: Principal | ICD-10-CM

## 2020-09-20 DIAGNOSIS — F4310 Post-traumatic stress disorder, unspecified: Principal | ICD-10-CM

## 2020-09-20 MED ORDER — VENLAFAXINE ER 150 MG CAPSULE,EXTENDED RELEASE 24 HR
ORAL_CAPSULE | Freq: Every day | ORAL | 1 refills | 30.00000 days | Status: CP
Start: 2020-09-20 — End: 2021-09-20
  Filled 2020-09-28: qty 60, 30d supply, fill #0

## 2020-09-20 MED ORDER — VENLAFAXINE ER 75 MG CAPSULE,EXTENDED RELEASE 24 HR
ORAL_CAPSULE | Freq: Every day | ORAL | 1 refills | 30 days | Status: CP
Start: 2020-09-20 — End: 2021-09-20

## 2020-09-20 MED ORDER — ARIPIPRAZOLE 2 MG TABLET
ORAL_TABLET | Freq: Every day | ORAL | 0 refills | 30 days | Status: CP
Start: 2020-09-20 — End: 2020-10-20
  Filled 2020-09-28: qty 30, 30d supply, fill #0

## 2020-09-28 MED FILL — VENLAFAXINE ER 75 MG CAPSULE,EXTENDED RELEASE 24 HR: ORAL | 30 days supply | Qty: 30 | Fill #0

## 2020-09-29 ENCOUNTER — Ambulatory Visit: Admit: 2020-09-29 | Discharge: 2020-09-30

## 2020-09-29 DIAGNOSIS — L03116 Cellulitis of left lower limb: Principal | ICD-10-CM

## 2020-09-29 MED ORDER — DOXYCYCLINE HYCLATE 100 MG TABLET
ORAL_TABLET | Freq: Two times a day (BID) | ORAL | 0 refills | 10 days | Status: CP
Start: 2020-09-29 — End: 2020-10-09

## 2020-10-05 ENCOUNTER — Encounter: Admit: 2020-10-05

## 2020-10-13 MED FILL — TOPIRAMATE 25 MG TABLET: ORAL | 30 days supply | Qty: 120 | Fill #1

## 2020-10-13 MED FILL — OMEPRAZOLE 20 MG CAPSULE,DELAYED RELEASE: ORAL | 90 days supply | Qty: 90 | Fill #1

## 2020-10-13 MED FILL — TRUVADA 200 MG-300 MG TABLET: ORAL | 30 days supply | Qty: 30 | Fill #6

## 2020-10-13 MED FILL — ERGOCALCIFEROL (VITAMIN D2) 1,250 MCG (50,000 UNIT) CAPSULE: ORAL | 56 days supply | Qty: 16 | Fill #2

## 2020-10-20 ENCOUNTER — Ambulatory Visit: Admit: 2020-10-20 | Discharge: 2020-10-21

## 2020-10-25 ENCOUNTER — Ambulatory Visit: Admit: 2020-10-25 | Discharge: 2020-10-26 | Attending: Rheumatology | Primary: Rheumatology

## 2020-10-25 DIAGNOSIS — Z8739 Personal history of other diseases of the musculoskeletal system and connective tissue: Principal | ICD-10-CM

## 2020-10-25 DIAGNOSIS — Q7961 Classical Ehlers-Danlos syndrome: Principal | ICD-10-CM

## 2020-10-25 MED ORDER — METHOTREXATE SODIUM 2.5 MG TABLET
ORAL_TABLET | ORAL | 3 refills | 21 days | Status: CP
Start: 2020-10-25 — End: 2020-11-24
  Filled 2020-10-28: qty 24, 21d supply, fill #0

## 2020-10-25 MED ORDER — HUMIRA PEN CITRATE FREE 40 MG/0.4 ML
SUBCUTANEOUS | 3 refills | 28.00000 days | Status: CP
Start: 2020-10-25 — End: ?

## 2020-10-25 MED ORDER — PHENTERMINE 30 MG CAPSULE
ORAL_CAPSULE | Freq: Every morning | ORAL | 2 refills | 30 days | Status: CP
Start: 2020-10-25 — End: ?
  Filled 2020-10-27: qty 30, 30d supply, fill #0

## 2020-10-25 MED ORDER — CELECOXIB 200 MG CAPSULE
ORAL_CAPSULE | Freq: Every day | ORAL | 1 refills | 180 days | Status: CP | PRN
Start: 2020-10-25 — End: 2021-10-20
  Filled 2020-11-16: qty 90, 90d supply, fill #0

## 2020-10-27 MED ORDER — ARIPIPRAZOLE 2 MG TABLET
ORAL_TABLET | Freq: Every day | ORAL | 0 refills | 30 days | Status: CP
Start: 2020-10-27 — End: 2020-11-26
  Filled 2020-10-28: qty 30, 30d supply, fill #0

## 2020-11-03 ENCOUNTER — Telehealth: Admit: 2020-11-03 | Discharge: 2020-11-04

## 2020-11-03 DIAGNOSIS — F4310 Post-traumatic stress disorder, unspecified: Principal | ICD-10-CM

## 2020-11-03 DIAGNOSIS — F411 Generalized anxiety disorder: Principal | ICD-10-CM

## 2020-11-03 MED ORDER — ARIPIPRAZOLE 5 MG TABLET
ORAL_TABLET | Freq: Every day | ORAL | 0 refills | 30 days | Status: CP
Start: 2020-11-03 — End: 2020-12-03
  Filled 2020-11-16: qty 30, 30d supply, fill #0

## 2020-11-16 DIAGNOSIS — Z79899 Other long term (current) drug therapy: Principal | ICD-10-CM

## 2020-11-16 MED ORDER — EMTRICITABINE 200 MG-TENOFOVIR DISOPROXIL FUMARATE 300 MG TABLET
ORAL_TABLET | Freq: Every day | ORAL | 3 refills | 90 days | Status: CP
Start: 2020-11-16 — End: 2021-11-16
  Filled 2020-11-16: qty 30, 30d supply, fill #0

## 2020-11-16 MED FILL — VENLAFAXINE ER 75 MG CAPSULE,EXTENDED RELEASE 24 HR: ORAL | 30 days supply | Qty: 30 | Fill #1

## 2020-11-16 MED FILL — VENLAFAXINE ER 150 MG CAPSULE,EXTENDED RELEASE 24 HR: ORAL | 30 days supply | Qty: 60 | Fill #1

## 2020-11-16 MED FILL — TOPIRAMATE 25 MG TABLET: ORAL | 30 days supply | Qty: 120 | Fill #2

## 2020-11-16 MED FILL — SYNTHROID 75 MCG TABLET: ORAL | 60 days supply | Qty: 60 | Fill #1

## 2020-11-17 ENCOUNTER — Ambulatory Visit: Admit: 2020-11-17 | Discharge: 2020-11-18

## 2020-11-17 DIAGNOSIS — L97122 Non-pressure chronic ulcer of left thigh with fat layer exposed: Principal | ICD-10-CM

## 2020-11-17 DIAGNOSIS — M792 Neuralgia and neuritis, unspecified: Principal | ICD-10-CM

## 2020-11-17 MED ORDER — SODIUM HYPOCHLORITE 0.125 % SOLUTION
Freq: Every day | TOPICAL | 3 refills | 0 days | Status: CP
Start: 2020-11-17 — End: ?
  Filled 2020-11-24: qty 473, 30d supply, fill #0

## 2020-11-17 MED ORDER — GABAPENTIN 100 MG CAPSULE
ORAL_CAPSULE | Freq: Two times a day (BID) | ORAL | 1 refills | 30 days | Status: CP
Start: 2020-11-17 — End: 2020-12-17
  Filled 2020-11-24: qty 60, 30d supply, fill #0

## 2020-11-22 ENCOUNTER — Ambulatory Visit: Admit: 2020-11-22 | Discharge: 2020-11-23 | Attending: Internal Medicine | Primary: Internal Medicine

## 2020-11-22 DIAGNOSIS — E559 Vitamin D deficiency, unspecified: Principal | ICD-10-CM

## 2020-11-22 DIAGNOSIS — R32 Unspecified urinary incontinence: Principal | ICD-10-CM

## 2020-11-22 DIAGNOSIS — E039 Hypothyroidism, unspecified: Principal | ICD-10-CM

## 2020-11-22 DIAGNOSIS — L509 Urticaria, unspecified: Principal | ICD-10-CM

## 2020-11-22 DIAGNOSIS — Z79899 Other long term (current) drug therapy: Principal | ICD-10-CM

## 2020-11-22 DIAGNOSIS — E038 Other specified hypothyroidism: Principal | ICD-10-CM

## 2020-11-22 MED ORDER — LEVOTHYROXINE 50 MCG TABLET
ORAL_TABLET | Freq: Every day | ORAL | 3 refills | 90 days | Status: CP
Start: 2020-11-22 — End: 2021-11-22
  Filled 2020-11-24: qty 90, 90d supply, fill #0

## 2020-12-01 DIAGNOSIS — R32 Unspecified urinary incontinence: Principal | ICD-10-CM

## 2020-12-08 MED FILL — PHENTERMINE 30 MG CAPSULE: ORAL | 30 days supply | Qty: 30 | Fill #1

## 2020-12-14 ENCOUNTER — Telehealth: Admit: 2020-12-14 | Discharge: 2020-12-15

## 2020-12-14 DIAGNOSIS — F4310 Post-traumatic stress disorder, unspecified: Principal | ICD-10-CM

## 2020-12-14 MED ORDER — VENLAFAXINE ER 75 MG CAPSULE,EXTENDED RELEASE 24 HR
ORAL_CAPSULE | Freq: Every day | ORAL | 1 refills | 30.00000 days | Status: CP
Start: 2020-12-14 — End: 2021-12-14

## 2020-12-14 MED ORDER — ARIPIPRAZOLE 5 MG TABLET
ORAL_TABLET | Freq: Every day | ORAL | 0 refills | 30 days | Status: CP
Start: 2020-12-14 — End: 2021-01-13

## 2020-12-14 MED ORDER — VENLAFAXINE ER 150 MG CAPSULE,EXTENDED RELEASE 24 HR
ORAL_CAPSULE | Freq: Every day | ORAL | 1 refills | 30 days | Status: CP
Start: 2020-12-14 — End: 2021-12-14

## 2020-12-16 ENCOUNTER — Ambulatory Visit: Admit: 2020-12-16 | Discharge: 2020-12-19 | Disposition: A | Discharge status: Home / Self Care | Payer: MEDICAID

## 2020-12-16 ENCOUNTER — Encounter
Admit: 2020-12-16 | Discharge: 2020-12-19 | Disposition: A | Discharge status: Home / Self Care | Payer: MEDICAID | Attending: Medical

## 2020-12-18 MED ORDER — METHOTREXATE SODIUM 2.5 MG TABLET
ORAL_TABLET | ORAL | 3 refills | 21 days | Status: CP
Start: 2020-12-18 — End: 2021-01-17

## 2020-12-18 MED ORDER — METHOCARBAMOL 500 MG TABLET
ORAL_TABLET | Freq: Three times a day (TID) | ORAL | 0 refills | 7 days | Status: CP
Start: 2020-12-18 — End: 2020-12-25

## 2020-12-18 MED ORDER — OXYCODONE 5 MG TABLET
ORAL_TABLET | ORAL | 0 refills | 2 days | Status: CP | PRN
Start: 2020-12-18 — End: ?

## 2020-12-19 MED ORDER — METHOCARBAMOL 500 MG TABLET
ORAL_TABLET | Freq: Three times a day (TID) | ORAL | 0 refills | 7 days | Status: CP
Start: 2020-12-19 — End: 2020-12-26

## 2020-12-19 MED ORDER — VENLAFAXINE ER 150 MG CAPSULE,EXTENDED RELEASE 24 HR
ORAL_CAPSULE | Freq: Every day | ORAL | 0 refills | 30.00000 days | Status: CP
Start: 2020-12-19 — End: 2021-01-18

## 2020-12-19 MED ORDER — VENLAFAXINE ER 75 MG CAPSULE,EXTENDED RELEASE 24 HR
ORAL_CAPSULE | Freq: Every day | ORAL | 0 refills | 30.00000 days | Status: CP
Start: 2020-12-19 — End: 2020-12-19

## 2020-12-19 MED ORDER — OXYCODONE 5 MG TABLET
ORAL_TABLET | ORAL | 0 refills | 2.00000 days | Status: CP | PRN
Start: 2020-12-19 — End: ?

## 2020-12-23 ENCOUNTER — Ambulatory Visit: Admit: 2020-12-23 | Discharge: 2020-12-24 | Attending: Internal Medicine | Primary: Internal Medicine

## 2020-12-23 DIAGNOSIS — E041 Nontoxic single thyroid nodule: Principal | ICD-10-CM

## 2020-12-27 MED FILL — OMEPRAZOLE 20 MG CAPSULE,DELAYED RELEASE: ORAL | 90 days supply | Qty: 90 | Fill #2

## 2020-12-27 MED FILL — GABAPENTIN 100 MG CAPSULE: ORAL | 30 days supply | Qty: 60 | Fill #1

## 2020-12-27 MED FILL — TOPIRAMATE 25 MG TABLET: ORAL | 30 days supply | Qty: 120 | Fill #3

## 2020-12-27 MED FILL — ERGOCALCIFEROL (VITAMIN D2) 1,250 MCG (50,000 UNIT) CAPSULE: ORAL | 84 days supply | Qty: 24 | Fill #3

## 2020-12-27 MED FILL — FEROSUL 325 MG (65 MG IRON) TABLET: ORAL | 90 days supply | Qty: 45 | Fill #2

## 2020-12-31 MED ORDER — VENLAFAXINE ER 150 MG CAPSULE,EXTENDED RELEASE 24 HR
ORAL_CAPSULE | Freq: Every day | ORAL | 0 refills | 30.00000 days | Status: CP
Start: 2020-12-31 — End: 2021-01-30

## 2020-12-31 MED ORDER — ARIPIPRAZOLE 5 MG TABLET
ORAL_TABLET | Freq: Every day | ORAL | 0 refills | 30 days | Status: CP
Start: 2020-12-31 — End: ?
  Filled 2021-01-04: qty 30, 30d supply, fill #0

## 2020-12-31 MED ORDER — VENLAFAXINE ER 75 MG CAPSULE,EXTENDED RELEASE 24 HR
ORAL_CAPSULE | Freq: Every day | ORAL | 0 refills | 30 days | Status: CP
Start: 2020-12-31 — End: 2021-01-30
  Filled 2021-01-04: qty 30, 30d supply, fill #0

## 2021-01-04 ENCOUNTER — Ambulatory Visit: Admit: 2021-01-04 | Discharge: 2021-01-05 | Attending: Obstetrics & Gynecology | Primary: Obstetrics & Gynecology

## 2021-01-04 DIAGNOSIS — A630 Anogenital (venereal) warts: Principal | ICD-10-CM

## 2021-01-04 MED ORDER — IMIQUIMOD 5 % TOPICAL CREAM PACKET
PACK | TOPICAL | 0 refills | 0.00000 days | Status: CP
Start: 2021-01-04 — End: 2022-01-04
  Filled 2021-01-05: qty 36, 84d supply, fill #0

## 2021-01-04 MED FILL — PHENTERMINE 30 MG CAPSULE: ORAL | 30 days supply | Qty: 30 | Fill #2

## 2021-01-04 MED FILL — VENLAFAXINE ER 150 MG CAPSULE,EXTENDED RELEASE 24 HR: ORAL | 30 days supply | Qty: 60 | Fill #0

## 2021-01-04 MED FILL — FOLIC ACID 1 MG TABLET: ORAL | 90 days supply | Qty: 180 | Fill #2

## 2021-01-06 ENCOUNTER — Ambulatory Visit: Admit: 2021-01-06 | Discharge: 2021-01-07

## 2021-01-06 DIAGNOSIS — L02416 Cutaneous abscess of left lower limb: Principal | ICD-10-CM

## 2021-01-06 DIAGNOSIS — L97122 Non-pressure chronic ulcer of left thigh with fat layer exposed: Principal | ICD-10-CM

## 2021-01-06 DIAGNOSIS — L03116 Cellulitis of left lower limb: Principal | ICD-10-CM

## 2021-01-07 ENCOUNTER — Emergency Department: Admit: 2021-01-07 | Discharge: 2021-01-08 | Disposition: A | Discharge status: Home / Self Care | Payer: MEDICAID

## 2021-01-07 ENCOUNTER — Ambulatory Visit: Admit: 2021-01-07 | Discharge: 2021-01-08 | Disposition: A | Discharge status: Home / Self Care | Payer: MEDICAID

## 2021-01-07 DIAGNOSIS — S81802D Unspecified open wound, left lower leg, subsequent encounter: Principal | ICD-10-CM

## 2021-01-07 MED ORDER — DOXYCYCLINE HYCLATE 100 MG CAPSULE
ORAL_CAPSULE | Freq: Two times a day (BID) | ORAL | 0 refills | 14.00000 days | Status: CP
Start: 2021-01-07 — End: 2021-01-07

## 2021-01-10 ENCOUNTER — Ambulatory Visit: Admit: 2021-01-10 | Discharge: 2021-01-10

## 2021-01-10 DIAGNOSIS — Z09 Encounter for follow-up examination after completed treatment for conditions other than malignant neoplasm: Principal | ICD-10-CM

## 2021-01-12 ENCOUNTER — Ambulatory Visit: Admit: 2021-01-12 | Discharge: 2021-01-13 | Attending: Vascular Surgery | Primary: Vascular Surgery

## 2021-01-12 DIAGNOSIS — L03116 Cellulitis of left lower limb: Principal | ICD-10-CM

## 2021-01-12 DIAGNOSIS — A4902 Methicillin resistant Staphylococcus aureus infection, unspecified site: Principal | ICD-10-CM

## 2021-01-26 ENCOUNTER — Ambulatory Visit: Admit: 2021-01-26 | Discharge: 2021-01-27

## 2021-01-26 DIAGNOSIS — L02416 Cutaneous abscess of left lower limb: Principal | ICD-10-CM

## 2021-01-26 DIAGNOSIS — L97122 Non-pressure chronic ulcer of left thigh with fat layer exposed: Principal | ICD-10-CM

## 2021-01-28 DIAGNOSIS — F32A Depression, unspecified depression type: Principal | ICD-10-CM

## 2021-01-28 DIAGNOSIS — F419 Anxiety disorder, unspecified: Principal | ICD-10-CM

## 2021-01-28 MED ORDER — VENLAFAXINE ER 75 MG CAPSULE,EXTENDED RELEASE 24 HR
ORAL_CAPSULE | Freq: Every day | ORAL | 0 refills | 30 days | Status: CP
Start: 2021-01-28 — End: 2021-02-27
  Filled 2021-02-01: qty 30, 30d supply, fill #0

## 2021-01-28 MED ORDER — ARIPIPRAZOLE 5 MG TABLET
ORAL_TABLET | Freq: Every day | ORAL | 0 refills | 30 days | Status: CP
Start: 2021-01-28 — End: ?
  Filled 2021-02-01: qty 30, 30d supply, fill #0

## 2021-01-28 MED ORDER — PHENTERMINE 30 MG CAPSULE
ORAL_CAPSULE | Freq: Every morning | ORAL | 2 refills | 30 days | Status: CP
Start: 2021-01-28 — End: ?
  Filled 2021-02-01: qty 30, 30d supply, fill #0

## 2021-01-28 MED ORDER — VENLAFAXINE ER 150 MG CAPSULE,EXTENDED RELEASE 24 HR
ORAL_CAPSULE | Freq: Every day | ORAL | 0 refills | 30 days | Status: CP
Start: 2021-01-28 — End: 2021-02-27

## 2021-01-28 MED ORDER — TOPIRAMATE 25 MG TABLET
ORAL_TABLET | Freq: Two times a day (BID) | ORAL | 3 refills | 30 days | Status: CP
Start: 2021-01-28 — End: ?
  Filled 2021-02-01: qty 120, 30d supply, fill #0

## 2021-02-01 ENCOUNTER — Ambulatory Visit: Admit: 2021-02-01 | Discharge: 2021-02-02

## 2021-02-01 DIAGNOSIS — A4902 Methicillin resistant Staphylococcus aureus infection, unspecified site: Principal | ICD-10-CM

## 2021-02-01 DIAGNOSIS — L089 Local infection of the skin and subcutaneous tissue, unspecified: Principal | ICD-10-CM

## 2021-02-01 DIAGNOSIS — L02416 Cutaneous abscess of left lower limb: Principal | ICD-10-CM

## 2021-02-01 DIAGNOSIS — S81802A Unspecified open wound, left lower leg, initial encounter: Principal | ICD-10-CM

## 2021-02-01 DIAGNOSIS — L97122 Non-pressure chronic ulcer of left thigh with fat layer exposed: Principal | ICD-10-CM

## 2021-02-01 DIAGNOSIS — L03116 Cellulitis of left lower limb: Principal | ICD-10-CM

## 2021-02-01 MED ORDER — CHLORHEXIDINE GLUCONATE 4 % TOPICAL LIQUID
Freq: Every day | TOPICAL | 1 refills | 0 days | Status: CP
Start: 2021-02-01 — End: ?

## 2021-02-01 MED FILL — VENLAFAXINE ER 150 MG CAPSULE,EXTENDED RELEASE 24 HR: ORAL | 30 days supply | Qty: 60 | Fill #0

## 2021-02-04 ENCOUNTER — Ambulatory Visit
Admit: 2021-02-04 | Discharge: 2021-02-05 | Attending: Plastic and Reconstructive Surgery | Primary: Plastic and Reconstructive Surgery

## 2021-02-04 DIAGNOSIS — L97122 Non-pressure chronic ulcer of left thigh with fat layer exposed: Principal | ICD-10-CM

## 2021-02-04 DIAGNOSIS — L02416 Cutaneous abscess of left lower limb: Principal | ICD-10-CM

## 2021-02-14 ENCOUNTER — Ambulatory Visit
Admit: 2021-02-14 | Discharge: 2021-02-14 | Payer: PRIVATE HEALTH INSURANCE | Attending: Internal Medicine | Primary: Internal Medicine

## 2021-02-14 ENCOUNTER — Ambulatory Visit: Admit: 2021-02-14 | Discharge: 2021-02-14 | Payer: PRIVATE HEALTH INSURANCE

## 2021-02-14 DIAGNOSIS — Z79899 Other long term (current) drug therapy: Principal | ICD-10-CM

## 2021-02-14 DIAGNOSIS — R87810 Cervical high risk human papillomavirus (HPV) DNA test positive: Principal | ICD-10-CM

## 2021-02-14 DIAGNOSIS — E559 Vitamin D deficiency, unspecified: Principal | ICD-10-CM

## 2021-02-14 DIAGNOSIS — N761 Subacute and chronic vaginitis: Principal | ICD-10-CM

## 2021-02-14 DIAGNOSIS — E039 Hypothyroidism, unspecified: Principal | ICD-10-CM

## 2021-02-14 DIAGNOSIS — N879 Dysplasia of cervix uteri, unspecified: Principal | ICD-10-CM

## 2021-02-16 ENCOUNTER — Ambulatory Visit
Admit: 2021-02-16 | Discharge: 2021-02-17 | Payer: PRIVATE HEALTH INSURANCE | Attending: Vascular Surgery | Primary: Vascular Surgery

## 2021-02-16 DIAGNOSIS — L02416 Cutaneous abscess of left lower limb: Principal | ICD-10-CM

## 2021-02-17 DIAGNOSIS — N760 Acute vaginitis: Principal | ICD-10-CM

## 2021-02-17 DIAGNOSIS — B9689 Other specified bacterial agents as the cause of diseases classified elsewhere: Principal | ICD-10-CM

## 2021-02-17 MED ORDER — METRONIDAZOLE 500 MG TABLET
ORAL_TABLET | Freq: Two times a day (BID) | ORAL | 0 refills | 7.00000 days | Status: CP
Start: 2021-02-17 — End: 2021-02-24

## 2021-02-22 ENCOUNTER — Ambulatory Visit: Admit: 2021-02-22 | Discharge: 2021-02-23 | Payer: PRIVATE HEALTH INSURANCE

## 2021-02-22 DIAGNOSIS — S81802A Unspecified open wound, left lower leg, initial encounter: Principal | ICD-10-CM

## 2021-02-22 DIAGNOSIS — L03116 Cellulitis of left lower limb: Principal | ICD-10-CM

## 2021-02-22 DIAGNOSIS — S81802D Unspecified open wound, left lower leg, subsequent encounter: Principal | ICD-10-CM

## 2021-02-22 DIAGNOSIS — L02416 Cutaneous abscess of left lower limb: Principal | ICD-10-CM

## 2021-02-22 DIAGNOSIS — L089 Local infection of the skin and subcutaneous tissue, unspecified: Principal | ICD-10-CM

## 2021-02-22 DIAGNOSIS — E039 Hypothyroidism, unspecified: Principal | ICD-10-CM

## 2021-02-22 DIAGNOSIS — Z9189 Other specified personal risk factors, not elsewhere classified: Principal | ICD-10-CM

## 2021-02-22 DIAGNOSIS — A4902 Methicillin resistant Staphylococcus aureus infection, unspecified site: Principal | ICD-10-CM

## 2021-02-22 MED ORDER — TEDIZOLID 200 MG TABLET
ORAL_TABLET | Freq: Every day | ORAL | 0 refills | 14 days | Status: CP
Start: 2021-02-22 — End: 2021-03-08
  Filled 2021-02-22: qty 14, 14d supply, fill #0
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
## 2021-02-22 NOTE — Unmapped (Signed)
Thank you for coming in today.    Please note that your laboratory and other results may be visible to you in real time, possibly before they reach your provider. Please allow 48 hours for clinical interpretation of these results. Importantly, even if a result is flagged as abnormal, it may not be one that impacts your health.    The ID clinic phone number is [phone] (toll free [phone]).  The ID clinic fax number is [phone].    For urgent issues on nights and weekends you may reach the ID Physician on call through the [facility] Operator at [phone].     Mechele Claude, MD, PhD  Clinical Associate Professor  [facility] Infectious Diseases Clinic at [facility]  [address]   [address] [place], [place] [address]  Phone: [phone]   Fax: [phone]

## 2021-02-23 NOTE — Unmapped (Signed)
I saw patient in clinic yesterday and she had a large area of induration, erythema and pain superior to her wound. I ordered an ultrasound which shows:    FINDINGS:   There is edema throughout the subcutaneous fat. There is a lobulated 6.1 x 3.0 x 9.0 cm hypoechoic fluid collection with complex internal debris. There appears to be a tract that connects with the skin.  ??  IMPRESSION:  There is a subcutaneous fluid collection near the left thigh wound with complex internal debris which could represent an abscess. Additionally, there is edema overlying the fluid collection consistent with cellulitis.    The patient states that last night prior to packing the wound she was probing and able to get the Qtip in 2 inches. I only got in 1 cm yesterday.  She states a lot of fluid came out. Some pus but a bloody mess  I was able to get her Tedizolid and it was delivered this AM and she started it.  No fevers or chills.  She feels like it is maybeless indurated and less painful.  I see her again in a week  Recommended she reach out to you to schedule an appointment for earlier than 1 month.     Thanks,  MC Lavonne Kinderman  ID

## 2021-02-23 NOTE — Unmapped (Signed)
I saw patient in clinic yesterday and she had a large area of induration, erythema and pain superior to her wound.  Ultrasound obtained which shows:  FINDINGS:   There is edema throughout the subcutaneous fat. There is a lobulated 6.1 x 3.0 x 9.0 cm hypoechoic fluid collection with complex internal debris. There appears to be a tract that connects with the skin.  ??  IMPRESSION:  There is a subcutaneous fluid collection near the left thigh wound with complex internal debris which could represent an abscess. Additionally, there is edema overlying the fluid collection consistent with cellulitis.    The patient states that last night prior to packing the wound she was probing and able to get the Qtip in 2 inches. I only got in 1 cm yesterday.  She states a lot of fluid came out. Some pus but a bloody mess  The Tedizolid was delivered this AM and she started it.  No fevers or chills.  She feels like it is less indurated and less painful.  I see her again in a week and recommended she reach out to [place] to schedule an appointment for earlier than 1 month.

## 2021-02-28 ENCOUNTER — Ambulatory Visit: Admit: 2021-02-28 | Discharge: 2021-02-28 | Payer: PRIVATE HEALTH INSURANCE

## 2021-02-28 DIAGNOSIS — L03116 Cellulitis of left lower limb: Principal | ICD-10-CM

## 2021-02-28 DIAGNOSIS — L02416 Cutaneous abscess of left lower limb: Principal | ICD-10-CM

## 2021-02-28 DIAGNOSIS — A4902 Methicillin resistant Staphylococcus aureus infection, unspecified site: Principal | ICD-10-CM

## 2021-02-28 NOTE — Unmapped (Signed)
Thank you for coming in today.    Please note that your laboratory and other results may be visible to you in real time, possibly before they reach your provider. Please allow 48 hours for clinical interpretation of these results. Importantly, even if a result is flagged as abnormal, it may not be one that impacts your health.    The ID clinic phone number is [phone] (toll free [phone]).  The ID clinic fax number is [phone].    For urgent issues on nights and weekends you may reach the ID Physician on call through the [facility] Operator at [phone].     Mechele Claude, MD, PhD  Clinical Associate Professor  [facility] Infectious Diseases Clinic at [facility]  [address]   [address] [place], [place] [address]  Phone: [phone]   Fax: [phone]
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
## 2021-03-03 NOTE — Unmapped (Signed)
Specialty Medication(s): Sivextro 200mg     Ms.Durnin has been dis-enrolled from the [facility] specialty pharmacy services due to therapy completion - expected therapy completion date: 03/09/21 based on starting the Sivextro on 02/23/21.  She can be re-enrolled if needed.    Additional information provided to the patient: n/a    Roderic Palau  [facility] Specialty Pharmacist

## 2021-03-04 ENCOUNTER — Ambulatory Visit: Admit: 2021-03-04 | Discharge: 2021-03-04 | Payer: PRIVATE HEALTH INSURANCE

## 2021-03-04 DIAGNOSIS — Z1231 Encounter for screening mammogram for malignant neoplasm of breast: Principal | ICD-10-CM

## 2021-03-11 MED ORDER — ARIPIPRAZOLE 5 MG TABLET
ORAL_TABLET | Freq: Every day | ORAL | 0 refills | 30.00000 days | Status: CP
Start: 2021-03-11 — End: ?

## 2021-03-15 MED FILL — TOPIRAMATE 25 MG TABLET: ORAL | 30 days supply | Qty: 120 | Fill #1

## 2021-03-16 DIAGNOSIS — L03116 Cellulitis of left lower limb: Principal | ICD-10-CM

## 2021-03-16 DIAGNOSIS — E039 Hypothyroidism, unspecified: Principal | ICD-10-CM

## 2021-03-16 MED ORDER — VENLAFAXINE ER 75 MG CAPSULE,EXTENDED RELEASE 24 HR
ORAL_CAPSULE | Freq: Every day | ORAL | 0 refills | 30.00000 days | Status: CP
Start: 2021-03-16 — End: 2021-04-15

## 2021-03-16 MED ORDER — VENLAFAXINE ER 150 MG CAPSULE,EXTENDED RELEASE 24 HR
ORAL_CAPSULE | Freq: Every day | ORAL | 0 refills | 30 days | Status: CP
Start: 2021-03-16 — End: 2021-04-15

## 2021-03-17 ENCOUNTER — Ambulatory Visit
Admit: 2021-03-17 | Discharge: 2021-03-21 | Disposition: A | Discharge status: Home / Self Care | Payer: PRIVATE HEALTH INSURANCE | Admitting: Student in an Organized Health Care Education/Training Program

## 2021-03-17 ENCOUNTER — Ambulatory Visit
Admit: 2021-03-16 | Discharge: 2021-03-17 | Discharge status: Against Medical Advice | Payer: PRIVATE HEALTH INSURANCE | Attending: Student in an Organized Health Care Education/Training Program | Primary: Student in an Organized Health Care Education/Training Program

## 2021-03-17 ENCOUNTER — Ambulatory Visit: Admit: 2021-03-17 | Discharge: 2021-03-17 | Discharge status: Against Medical Advice | Payer: PRIVATE HEALTH INSURANCE

## 2021-03-17 DIAGNOSIS — S81802A Unspecified open wound, left lower leg, initial encounter: Principal | ICD-10-CM

## 2021-03-17 MED ORDER — TEDIZOLID 200 MG TABLET
ORAL_TABLET | Freq: Every day | ORAL | 0 refills | 14 days | Status: CP
Start: 2021-03-17 — End: 2021-03-31
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
## 2021-03-21 MED ORDER — OXYCODONE 5 MG TABLET
ORAL_TABLET | ORAL | 0 refills | 2.00000 days | Status: CP | PRN
Start: 2021-03-21 — End: 2021-03-21

## 2021-03-21 MED ORDER — TEDIZOLID 200 MG TABLET
ORAL_TABLET | Freq: Every day | ORAL | 0 refills | 6 days | Status: CP
Start: 2021-03-21 — End: 2021-03-27
  Filled 2021-03-21: qty 6, 6d supply, fill #0

## 2021-03-21 MED ADMIN — gabapentin (NEURONTIN) capsule 100 mg: 100 mg | ORAL | @ 15:00:00 | Stop: 2021-03-21

## 2021-03-21 MED ADMIN — ARIPiprazole (ABILIFY) tablet 5 mg: 5 mg | ORAL | @ 15:00:00 | Stop: 2021-03-21

## 2021-03-21 MED ADMIN — topiramate (TOPAMAX) tablet 50 mg: 50 mg | ORAL | @ 15:00:00 | Stop: 2021-03-21

## 2021-03-21 MED ADMIN — Bifidobacterium infantis (ALIGN) capsule 4 mg: 4 mg | ORAL | @ 15:00:00 | Stop: 2021-03-21

## 2021-03-21 MED ADMIN — oxyCODONE (ROXICODONE) immediate release tablet 5 mg: 5 mg | ORAL | @ 08:00:00 | Stop: 2021-03-21

## 2021-03-21 MED ADMIN — venlafaxine (EFFEXOR-XR) 24 hr capsule 375 mg: 375 mg | ORAL | @ 15:00:00 | Stop: 2021-03-21

## 2021-03-21 MED ADMIN — folic acid (FOLVITE) tablet 2 mg: 2 mg | ORAL | @ 15:00:00 | Stop: 2021-03-21

## 2021-03-21 MED ADMIN — levothyroxine (SYNTHROID) tablet 50 mcg: 50 ug | ORAL | @ 11:00:00 | Stop: 2021-03-21

## 2021-03-21 MED ADMIN — acetaminophen (TYLENOL) tablet 1,000 mg: 1000 mg | ORAL | @ 11:00:00 | Stop: 2021-03-21

## 2021-03-21 MED ADMIN — cholecalciferol (vitamin D3-10 mcg (400 unit)) tablet 10 mcg: 10 ug | ORAL | @ 15:00:00 | Stop: 2021-03-21

## 2021-03-21 MED ADMIN — vancomycin (VANCOCIN) 1,000 mg in sodium chloride (NS) 0.9 % 250 mL vialmate: 1000 mg | INTRAVENOUS | @ 10:00:00 | Stop: 2021-03-21

## 2021-03-21 NOTE — Unmapped (Signed)
Patient lying in bed, with right leg elevated. Currently in NAD, dressing change done to left thigh tolerated well, continues to have pain managed by PRN and scheduled meds per [facility]. Central line flushed and capped, blue line seems to be occluded and wont flush.  Ileostomy bag intact and managed by patient.  Patient is able to make needs known to staff, call bell within reach will continue to monitor.      Problem: Adult Inpatient Plan of Care  Goal: Plan of Care Review  Outcome: Progressing  Flowsheets (Taken 03/21/2021 0308)  Progress: improving  Plan of Care Reviewed With: patient  Goal: Patient-Specific Goal (Individualized)  Outcome: Progressing  Flowsheets (Taken 03/21/2021 0308)  Patient-Specific Goals (Include Timeframe): patient will continue to tolerate antibiotics without any adverse events  Individualized Care Needs: Pain control, wound care and dressing changes, contact isolation, illeostomy  Anxieties, Fears or Concerns: None  Goal: Absence of Hospital-Acquired Illness or Injury  Outcome: Progressing  Intervention: Identify and Manage Fall Risk  Flowsheets (Taken 03/21/2021 0308)  Safety Interventions:   isolation precautions   lighting adjusted for tasks/safety   low bed   bleeding precautions   nonskid shoes/slippers when out of bed   fall reduction program maintained  Intervention: Prevent Skin Injury  Flowsheets (Taken 03/21/2021 0308)  Body Position: position changed independently  Goal: Optimal Comfort and Wellbeing  Outcome: Progressing  Goal: Readiness for Transition of Care  Outcome: Progressing  Goal: Rounds/Family Conference  Outcome: Progressing     Problem: Infection  Goal: Absence of Infection Signs and Symptoms  Outcome: Progressing     Problem: Infection  Goal: Absence of Infection Signs and Symptoms  Outcome: Progressing     Problem: Impaired Wound Healing  Goal: Optimal Wound Healing  Outcome: Progressing

## 2021-03-21 NOTE — Unmapped (Signed)
Addended by: Nicole Kindred on: 03/21/2021 07:48 AM     Modules accepted: Level of Service

## 2021-03-21 NOTE — Unmapped (Signed)
Central line removed with patient in supine position and exhaling.  Tegaderm and clean gauze dressing applied.  Bleeding was well controlled.  Patient tolerated well.  Patient instructed to leave dressing in place for 24 to 48 hours.

## 2021-03-21 NOTE — Unmapped (Signed)
Problem: Adult Inpatient Plan of Care  Goal: Plan of Care Review  Outcome: Discharged to Home  Goal: Patient-Specific Goal (Individualized)  Outcome: Discharged to Home  Goal: Absence of Hospital-Acquired Illness or Injury  Outcome: Discharged to Home  Goal: Optimal Comfort and Wellbeing  Outcome: Discharged to Home  Goal: Readiness for Transition of Care  Outcome: Discharged to Home  Goal: Rounds/Family Conference  Outcome: Discharged to Home     Problem: Infection  Goal: Absence of Infection Signs and Symptoms  Outcome: Discharged to Home     Problem: Infection  Goal: Absence of Infection Signs and Symptoms  Outcome: Discharged to Home     Problem: Impaired Wound Healing  Goal: Optimal Wound Healing  Outcome: Discharged to Home

## 2021-03-21 NOTE — Unmapped (Signed)
Immediately after or during the visit, I reviewed with the resident the medical history and the resident???s findings on physical examination.  I discussed with the resident the patient???s diagnosis and concur with the treatment plan as documented in the resident note. I personally examined Julia Evans. Julia Chance, MD

## 2021-03-21 NOTE — Unmapped (Addendum)
Opened encounter in error  
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
## 2021-03-23 ENCOUNTER — Ambulatory Visit: Admit: 2021-03-23 | Discharge: 2021-03-24 | Payer: PRIVATE HEALTH INSURANCE

## 2021-03-23 DIAGNOSIS — L97122 Non-pressure chronic ulcer of left thigh with fat layer exposed: Principal | ICD-10-CM

## 2021-03-23 DIAGNOSIS — L02416 Cutaneous abscess of left lower limb: Principal | ICD-10-CM

## 2021-03-28 ENCOUNTER — Ambulatory Visit: Admit: 2021-03-28 | Discharge: 2021-03-29 | Payer: PRIVATE HEALTH INSURANCE

## 2021-03-28 DIAGNOSIS — L03116 Cellulitis of left lower limb: Principal | ICD-10-CM

## 2021-03-28 DIAGNOSIS — A4902 Methicillin resistant Staphylococcus aureus infection, unspecified site: Principal | ICD-10-CM

## 2021-03-28 DIAGNOSIS — L02416 Cutaneous abscess of left lower limb: Principal | ICD-10-CM

## 2021-03-28 MED ORDER — FOLIC ACID 1 MG TABLET
ORAL_TABLET | Freq: Every day | ORAL | 3 refills | 90 days | Status: CN
Start: 2021-03-28 — End: ?

## 2021-03-29 DIAGNOSIS — B379 Candidiasis, unspecified: Principal | ICD-10-CM

## 2021-03-29 DIAGNOSIS — S81802D Unspecified open wound, left lower leg, subsequent encounter: Principal | ICD-10-CM

## 2021-03-29 MED ORDER — FLUCONAZOLE 150 MG TABLET
ORAL_TABLET | Freq: Once | ORAL | 3 refills | 0 days | Status: CP | PRN
Start: 2021-03-29 — End: ?

## 2021-03-29 NOTE — Unmapped (Signed)
Received communication from patient that dalbavancin is covered at no cost to her. Updated the original therapy plan from 1 to 2 doses separated by 7 days and sent request for scheduling to [facility] location.     Of note, patient reported symptoms of yeast infection given ongoing abx use. Based on her symptoms and plan for further antibiotics she is worried she might require multiple doses of fluconazole. Rx to preferred pharmacy sent in.     Maryclare Bean, PharmD, BCPS, CPP   [facility] Pharmacist     Time Spent:  30 Minutes

## 2021-03-30 NOTE — Unmapped (Signed)
Specialty Medication(s): Sivextro 200mg     Julia Evans has been dis-enrolled from the [facility] specialty pharmacy services due to therapy completed on  03/27/21.    Additional information provided to the patient: n/a    Roderic Palau  [facility] Specialty Pharmacist

## 2021-04-04 ENCOUNTER — Ambulatory Visit: Admit: 2021-04-04 | Discharge: 2021-04-04 | Payer: PRIVATE HEALTH INSURANCE

## 2021-04-04 DIAGNOSIS — S81802D Unspecified open wound, left lower leg, subsequent encounter: Principal | ICD-10-CM

## 2021-04-05 ENCOUNTER — Telehealth
Admit: 2021-04-05 | Discharge: 2021-04-06 | Payer: PRIVATE HEALTH INSURANCE | Attending: Obstetrics & Gynecology | Primary: Obstetrics & Gynecology

## 2021-04-05 DIAGNOSIS — A630 Anogenital (venereal) warts: Principal | ICD-10-CM

## 2021-04-05 MED ORDER — OXYCODONE 5 MG TABLET
ORAL_TABLET | ORAL | 0 refills | 2.00000 days | Status: CP | PRN
Start: 2021-04-05 — End: ?

## 2021-04-05 MED ORDER — LIDOCAINE 5 % TOPICAL OINTMENT
Freq: Three times a day (TID) | TOPICAL | 0 refills | 0.00000 days | Status: CP
Start: 2021-04-05 — End: 2022-04-05

## 2021-04-12 ENCOUNTER — Telehealth: Admit: 2021-04-12 | Discharge: 2021-04-13

## 2021-04-12 ENCOUNTER — Ambulatory Visit: Admit: 2021-04-12 | Discharge: 2021-04-13 | Payer: PRIVATE HEALTH INSURANCE

## 2021-04-12 DIAGNOSIS — S81802D Unspecified open wound, left lower leg, subsequent encounter: Principal | ICD-10-CM

## 2021-04-12 MED ORDER — VENLAFAXINE ER 75 MG CAPSULE,EXTENDED RELEASE 24 HR
ORAL_CAPSULE | Freq: Every day | ORAL | 3 refills | 90 days | Status: CP
Start: 2021-04-12 — End: 2021-05-12

## 2021-04-12 MED ORDER — VENLAFAXINE ER 150 MG CAPSULE,EXTENDED RELEASE 24 HR
ORAL_CAPSULE | Freq: Every day | ORAL | 3 refills | 90 days | Status: CP
Start: 2021-04-12 — End: 2021-05-12

## 2021-04-12 MED ORDER — LORAZEPAM 0.5 MG TABLET
ORAL_TABLET | Freq: Three times a day (TID) | ORAL | 0 refills | 15 days | Status: CP | PRN
Start: 2021-04-12 — End: ?

## 2021-04-12 MED ORDER — ARIPIPRAZOLE 5 MG TABLET
ORAL_TABLET | Freq: Every day | ORAL | 3 refills | 90 days | Status: CP
Start: 2021-04-12 — End: ?

## 2021-04-18 ENCOUNTER — Ambulatory Visit
Admit: 2021-04-18 | Discharge: 2021-04-18 | Payer: PRIVATE HEALTH INSURANCE | Attending: Student in an Organized Health Care Education/Training Program | Primary: Student in an Organized Health Care Education/Training Program

## 2021-04-18 ENCOUNTER — Ambulatory Visit: Admit: 2021-04-18 | Discharge: 2021-04-18 | Payer: PRIVATE HEALTH INSURANCE

## 2021-04-18 DIAGNOSIS — L02416 Cutaneous abscess of left lower limb: Principal | ICD-10-CM

## 2021-04-18 DIAGNOSIS — J310 Chronic rhinitis: Principal | ICD-10-CM

## 2021-04-18 DIAGNOSIS — T781XXA Other adverse food reactions, not elsewhere classified, initial encounter: Principal | ICD-10-CM

## 2021-04-18 DIAGNOSIS — L508 Other urticaria: Principal | ICD-10-CM

## 2021-04-18 DIAGNOSIS — L97122 Non-pressure chronic ulcer of left thigh with fat layer exposed: Principal | ICD-10-CM

## 2021-04-18 DIAGNOSIS — I890 Lymphedema, not elsewhere classified: Principal | ICD-10-CM

## 2021-04-18 DIAGNOSIS — T50905A Adverse effect of unspecified drugs, medicaments and biological substances, initial encounter: Principal | ICD-10-CM

## 2021-04-18 MED ORDER — AZELASTINE 137 MCG (0.1 %) NASAL SPRAY AEROSOL
Freq: Two times a day (BID) | NASAL | 11 refills | 30 days | Status: CP
Start: 2021-04-18 — End: 2022-04-18

## 2021-04-21 MED FILL — ERGOCALCIFEROL (VITAMIN D2) 1,250 MCG (50,000 UNIT) CAPSULE: ORAL | 28 days supply | Qty: 8 | Fill #4

## 2021-04-21 MED FILL — TOPIRAMATE 25 MG TABLET: ORAL | 30 days supply | Qty: 120 | Fill #2

## 2021-04-21 MED FILL — PHENTERMINE 30 MG CAPSULE: ORAL | 30 days supply | Qty: 30 | Fill #1

## 2021-04-21 MED FILL — OMEPRAZOLE 20 MG CAPSULE,DELAYED RELEASE: ORAL | 30 days supply | Qty: 30 | Fill #3

## 2021-04-29 MED ORDER — ARIPIPRAZOLE 5 MG TABLET
ORAL_TABLET | Freq: Every day | ORAL | 3 refills | 90 days | Status: CP
Start: 2021-04-29 — End: ?

## 2021-04-29 MED ORDER — VENLAFAXINE ER 150 MG CAPSULE,EXTENDED RELEASE 24 HR
ORAL_CAPSULE | Freq: Every day | ORAL | 3 refills | 90 days | Status: CP
Start: 2021-04-29 — End: 2021-05-29

## 2021-04-29 MED ORDER — VENLAFAXINE ER 75 MG CAPSULE,EXTENDED RELEASE 24 HR
ORAL_CAPSULE | Freq: Every day | ORAL | 3 refills | 90 days | Status: CP
Start: 2021-04-29 — End: 2021-05-29

## 2021-04-30 ENCOUNTER — Ambulatory Visit
Admit: 2021-04-30 | Discharge: 2021-05-03 | Disposition: A | Discharge status: Home / Self Care | Payer: PRIVATE HEALTH INSURANCE

## 2021-05-01 NOTE — Unmapped (Addendum)
Julia Evans is a 46 y.o. female with PMHX of Ehlers-Danlos syndrome, morbid obesity, hypothyroidism, inflammatory polyarthritis, hemicolectomy with ileostomy and history of infection and complex wound of the left thigh who presented for nonpurulent cellulitis of the left thigh.     ##Cellulitis left upper lateral thigh - history of complex wound after fatty necrosis excision: Presentation of significant erythema with warmth and consistent with cellulitis.  No fluctuance noted.  This area is had a history of MRSA.  Patient with several allergies and drug drug interactions in the past. Initially started on vancomycin, flagyl, and cefepime for broad spectrum coverage given mildly hypotensive and requiring brief time on norepi for MAPs in 50s, then responded appropriately to IVF and was able to transition to vanc/cefepime with good improvement of cellulitis. Blood cxs with NGTD @ 24 hrs. ID was consulted for further abx narrowing given her complex hx, drug allergies, and many drug-drug interactions. ID recommended stopping cefepime and transitioning from vanc to another course of tedizolid to complete a 14 day course of abx coverage for MRSA.    Of note, with regard to patient's brief norepi requirement, believe patient's low MAPs may have been multifactorial. Sh did not have her typical high caffeine intake, missed phentermine doses for 48 hours, and normally has low systolics in 100s. She also may have potential undiagnosed POTS in the setting of her EDS hx. Patient was asymptomatic when her MAPs were in the 50s and reported after the fact that she has had systolics in the 70s at home before and been symptomatic without simultaneous infections present.      ##Left anterior thigh wound and closing abscess: No surrounding erythema or significant tenderness in this area. Continued to follow drainage and dressed wound during admission.      Continued all chronic home medications aside from phentermine during admission.

## 2021-05-02 MED ORDER — TEDIZOLID 200 MG TABLET
ORAL_TABLET | Freq: Every day | ORAL | 0 refills | 12 days | Status: CP
Start: 2021-05-02 — End: 2021-05-02
  Filled 2021-05-03: qty 12, 12d supply, fill #0

## 2021-05-02 MED ORDER — SIVEXTRO 200 MG TABLET
ORAL_TABLET | 0 refills | 0 days
Start: 2021-05-02 — End: ?

## 2021-05-03 LAB — CBC
HEMATOCRIT: 32 % — ABNORMAL LOW (ref 34.0–44.0)
HEMOGLOBIN: 10.7 g/dL — ABNORMAL LOW (ref 11.3–14.9)
MEAN CORPUSCULAR HEMOGLOBIN CONC: 33.4 g/dL (ref 32.0–36.0)
MEAN CORPUSCULAR HEMOGLOBIN: 30.5 pg (ref 25.9–32.4)
MEAN CORPUSCULAR VOLUME: 91.3 fL (ref 77.6–95.7)
MEAN PLATELET VOLUME: 10.2 fL (ref 6.8–10.7)
PLATELET COUNT: 152 10*9/L (ref 150–450)
RED BLOOD CELL COUNT: 3.51 10*12/L — ABNORMAL LOW (ref 3.95–5.13)
RED CELL DISTRIBUTION WIDTH: 15.7 % — ABNORMAL HIGH (ref 12.2–15.2)
WBC ADJUSTED: 5.3 10*9/L (ref 3.6–11.2)

## 2021-05-03 LAB — BASIC METABOLIC PANEL
ANION GAP: 7 mmol/L (ref 7–15)
BLOOD UREA NITROGEN: 9 mg/dL (ref 7–21)
BUN / CREAT RATIO: 13
CALCIUM: 7.9 mg/dL — ABNORMAL LOW (ref 8.5–10.2)
CHLORIDE: 114 mmol/L — ABNORMAL HIGH (ref 98–107)
CO2: 18 mmol/L — ABNORMAL LOW (ref 22.0–32.0)
CREATININE: 0.7 mg/dL (ref 0.60–1.00)
EGFR CKD-EPI (2021) FEMALE: >90 mL/min/{1.73_m2} (ref >=60)
GLUCOSE RANDOM: 91 mg/dL (ref 74–106)
POTASSIUM: 3.8 mmol/L (ref 3.5–5.0)
SODIUM: 139 mmol/L (ref 135–145)

## 2021-05-03 MED ADMIN — ferrous sulfate tablet 325 mg: 325 mg | ORAL | @ 14:00:00 | Stop: 2021-05-03

## 2021-05-03 MED ADMIN — celecoxib (CeleBREX) capsule 200 mg: 200 mg | ORAL | @ 14:00:00 | Stop: 2021-05-03

## 2021-05-03 MED ADMIN — folic acid (FOLVITE) tablet 2 mg: 2 mg | ORAL | @ 14:00:00 | Stop: 2021-05-03

## 2021-05-03 MED ADMIN — levothyroxine (SYNTHROID) tablet 50 mcg: 50 ug | ORAL | @ 10:00:00 | Stop: 2021-05-03

## 2021-05-03 MED ADMIN — venlafaxine (EFFEXOR-XR) 24 hr capsule 375 mg: 375 mg | ORAL | @ 14:00:00 | Stop: 2021-05-03

## 2021-05-03 MED ADMIN — vancomycin (VANCOCIN) 1,000 mg in sodium chloride (NS) 0.9 % 250 mL vialmate: 1000 mg | INTRAVENOUS | @ 18:00:00 | Stop: 2021-05-03

## 2021-05-03 MED ADMIN — ARIPiprazole (ABILIFY) tablet 5 mg: 5 mg | ORAL | @ 14:00:00 | Stop: 2021-05-03

## 2021-05-03 MED ADMIN — topiramate (TOPAMAX) tablet 50 mg: 50 mg | ORAL | @ 14:00:00 | Stop: 2021-05-03

## 2021-05-03 MED ADMIN — gabapentin (NEURONTIN) capsule 100 mg: 100 mg | ORAL | @ 14:00:00 | Stop: 2021-05-03

## 2021-05-03 MED ADMIN — vancomycin (VANCOCIN) 1,000 mg in sodium chloride (NS) 0.9 % 250 mL vialmate: 1000 mg | INTRAVENOUS | @ 08:00:00 | Stop: 2021-05-03

## 2021-05-03 NOTE — Unmapped (Incomplete)
Problem: Adult Inpatient Plan of Care  Goal: Plan of Care Review  Outcome: Progressing  Flowsheets (Taken 05/03/2021 0618)  Progress: improving  Plan of Care Reviewed With: patient  Goal: Patient-Specific Goal (Individualized)  Outcome: Progressing  Flowsheets (Taken 05/03/2021 0620)  Patient-Specific Goals (Include Timeframe): patient will be free from adverse reaction this shift  Individualized Care Needs: Pain control, wound care, illeostomy, fall precaution, bleeding precautions, IV antibiotics,  Anxieties, Fears or Concerns: Denies  Goal: Absence of Hospital-Acquired Illness or Injury  Outcome: Progressing  Intervention: Identify and Manage Fall Risk  Flowsheets (Taken 05/03/2021 0620)  Safety Interventions:   bleeding precautions   infection management   fall reduction program maintained   nonskid shoes/slippers when out of bed   room near unit station   commode/urinal/bedpan at bedside  Goal: Optimal Comfort and Wellbeing  Outcome: Progressing  Goal: Readiness for Transition of Care  Outcome: Progressing  Goal: Rounds/Family Conference  Outcome: Progressing     Problem: Adjustment to Illness (Sepsis/Septic Shock)  Goal: Optimal Coping  Outcome: Progressing     Problem: Bleeding (Sepsis/Septic Shock)  Goal: Absence of Bleeding  Outcome: Progressing     Problem: Glycemic Control Impaired (Sepsis/Septic Shock)  Goal: Blood Glucose Level Within Desired Range  Outcome: Progressing     Problem: Infection Progression (Sepsis/Septic Shock)  Goal: Absence of Infection Signs and Symptoms  Outcome: Progressing     Problem: Nutrition Impaired (Sepsis/Septic Shock)  Goal: Optimal Nutrition Intake  Outcome: Progressing     Problem: Infection  Goal: Absence of Infection Signs and Symptoms  Outcome: Progressing

## 2021-05-03 NOTE — Unmapped (Signed)
[facility] Hospitalist Service  Daily Progress Note    Julia Evans is a 46 y.o. 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 *       LOS: 3 days     ##Cellulitis left upper lateral thigh (improving) - history of complex wound after fatty necrosis excision  -Presentation of significant erythema with warmth and consistent with cellulitis.  No fluctuance noted.  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  Patient with several allergies and drug drug interactions in the past.  -blood cxs NGTD @ 24hrs  -Cellulitis leading edge has been traced and is showing e/o ongoing improvement  -ID consulted, recommended transitioning from vanc/cefepime to tedizolid given DDI and h/o drug allergies. Have prescribed this so daughter can pick it up through [facility] specialty pharmacy once ready for pickup. Improving so much at this point that we feel she does not need to be observed on tedizolid and can discharge home once medication is in daughter's possession to ensure no missed doses.     ##Left anterior thigh wound and closing abscess.  -No surrounding erythema or significant tenderness in this area.  -Continue to follow drainage and dress wound.  Opening is punctate and may be difficult to pack.     ##Chronic medical issues  #Ehlers-Danlos syndrome  -Has been on Humira weekly in the past.  However has not had this for several months due to various infections  #Anxiety/depression  -Stable.    -Continue home Effexor 375 mg daily every morning.  -Continue Abilify 5 mg daily every morning  -Continue Topamax 50 mg twice daily  #GERD  -Stable.  Continue home PPI.  Substitute Protonix for omeprazole  #Anemia  -Normal H&H today. Continue to monitor and continue home iron supplementation every other day.  #Morbid obesity  -Patient on home phentermine 30 mg daily. Not available on hospital formulary. May have daughter try to bring this in but will encourage home level of high caffeine intake to help support Bps from a stimulant perspective.  #Hypothyroidism  -Continue home levothyroxine 50/75 mcg daily alternating  #Status post ileostomy  -Continue routine ostomy care    # FEN/GI:  - IVF LR at 135mL/hr  - Check electrolytes as indicated, replete as needed.  - Diet Regular    # PPX:   - DVT: SQ Lovenox    Code Status: Full Code    Disposition:   floor    Estimated Date of Discharge (EDD) :    05/03/21 vs 4/12    ______________________________________________________________________    Subjective:     Interval History: reports ongoing improvement of leg, feeling well overall.    ROS negative for fever, chills, night sweats, nausea, vomiting, diarrhea, constipation, chest pain, SOB, DOE, Abdominal pain, dysuria, vision changes, headache, unilateral weakness, numbness or tingling.       Objective:     Vital signs in last 24 hours:  Temp:  [36.2 ??C (97.2 ??F)-36.7 ??C (98.1 ??F)] 36.2 ??C (97.2 ??F)  Heart Rate:  [58-64] 58  Resp:  [16-18] 18  BP: (94-132)/(52-77) 132/77  MAP (mmHg):  [65-92] 92  SpO2:  [  98 %-100 %] 98 %    Intake/Output last 3 shifts:  I/O last 3 completed shifts:  In: 1690 [P.O.:840; IV Piggyback:850]  Out: 1000 [Stool:1000]    Weight:   Wt Readings from Last 4 Encounters:   04/30/21 (!) 108.4 kg (239 lb)   04/18/21 (!) 108.7 kg (239 lb 9.6 oz)   04/04/21 (!) 109.4 kg (241 lb 2.9 oz)   03/28/21 (!) 111.8 kg (246 lb 6.4 oz)       Physical Exam  Constitutional:       General: She is not in acute distress.     Appearance: Normal appearance.   Cardiovascular:      Rate and Rhythm: Normal rate and regular rhythm.      Pulses: Normal pulses.      Heart sounds: Normal heart sounds. No murmur heard.    No friction rub. No gallop.   Pulmonary: Effort: Pulmonary effort is normal. No respiratory distress.      Breath sounds: Normal breath sounds. No wheezing, rhonchi or rales.   Abdominal:      General: Abdomen is flat. Bowel sounds are normal. There is no distension.      Palpations: Abdomen is soft.      Tenderness: There is no abdominal tenderness.   Musculoskeletal:      Right lower leg: No edema.      Left lower leg: No edema.   Skin:     General: Skin is warm.      Capillary Refill: Capillary refill takes less than 2 seconds.      Comments: See image below, erythema improved from prior   Neurological:      General: No focal deficit present.      Mental Status: She is alert. Mental status is at baseline.   Psychiatric:         Mood and Affect: Mood normal.       The following labs/tests have been reviewed and independently interpreted by me:    Labs:  Recent Labs   Lab Units 05/03/21  0640 05/02/21  0700 05/01/21  0629   WBC 10*9/L 5.3 5.8 5.9   HEMOGLOBIN g/dL 16.1* 09.6* 04.5*   HEMATOCRIT % 32.0* 30.5* 30.5*   MCV fL 91.3 90.6 90.7   PLATELET COUNT (1) 10*9/L 152 130* 134*       Recent Labs   Lab Units 05/03/21  0640 05/02/21  0700 05/01/21  0629 04/30/21  1533   SODIUM mmol/L 139 140 137 135   POTASSIUM mmol/L 3.8 4.0 3.3* 3.6   CHLORIDE mmol/L 114* 116* 113* 108*   CO2 mmol/L 18.0* 17.0* 17.0* 18.0*   BUN mg/dL 9 12 12 18    CREATININE mg/dL 4.09 8.11 9.14 7.82   GLUCOSE mg/dL 91 88 83 72*   CALCIUM mg/dL 7.9* 7.6* 7.1* 8.5   ALBUMIN g/dL  --   --   --  3.7   ALT U/L  --   --   --  31   AST U/L  --   --   --  33   ALK PHOS U/L  --   --   --  105   BILIRUBIN TOTAL mg/dL  --   --   --  0.6   PROTEIN TOTAL g/dL  --   --   --  6.9       Recent Labs   Lab Units 04/30/21  1533   LACTATE mmol/L 1.5       No  results in the last week  No results in the last week    Invalid input(s): CK  No results in the last week   No results in the last week    No results in the last week        Pending Labs:  Pending Labs       Order Current Status    Blood Culture #2 Preliminary result            Imaging:  No results found.    Meds:   ARIPiprazole  5 mg Oral Daily    celecoxib  200 mg Oral Daily    enoxaparin (LOVENOX) injection  40 mg Subcutaneous Nightly    ferrous sulfate  325 mg Oral Every other day    folic acid  2 mg Oral Daily    gabapentin  100 mg Oral BID    levothyroxine  50 mcg Oral daily    pantoprazole  20 mg Oral Nightly    topiramate  50 mg Oral BID    vancomycin  1,000 mg Intravenous Q12H    venlafaxine  375 mg Oral Daily       Continuous Infusions:        Prn Meds:  acetaminophen, diclofenac sodium, senna **AND** docusate sodium, LORazepam, melatonin

## 2021-05-03 NOTE — Unmapped (Signed)
[facility] Specialty Medication Onboarding    Specialty Medication: SIVEXTRO 200 mg Tab (tedizolid)  Prior Authorization: Not Required   Financial Assistance: No - copay  <$25  Final Copay/Day Supply: $0 / 12    Insurance Restrictions: None     Notes to Pharmacist:     The triage team has completed the benefits investigation and has determined that the patient is able to fill this medication at [facility]. Please contact the patient to complete the onboarding or follow up with the prescribing physician as needed.

## 2021-05-03 NOTE — Unmapped (Signed)
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 *        Hospital Course:      PCP Follow Up:  [ ]  follow up cellulitis improvement, antibiotic tolerance    Julia Evans is a 46 y.o. female with PMHX of Ehlers-Danlos syndrome, morbid obesity, hypothyroidism, inflammatory polyarthritis, hemicolectomy with ileostomy and history of infection and complex wound of the left thigh who presented for nonpurulent cellulitis of the left thigh.     ##Cellulitis left upper lateral thigh - history of complex wound after fatty necrosis excision: Presentation of significant erythema with warmth and consistent with cellulitis.  No fluctuance noted.  This area is had a history of MRSA.  Patient with several allergies and drug drug interactions in the past. Initially started on vancomycin, flagyl, and cefepime for broad spectrum coverage given mildly hypotensive and requiring brief time on norepi for MAPs in 50s, then responded appropriately to IVF and was able to transition to vanc/cefepime with good improvement of cellulitis. Blood cxs with NGTD @ 24 hrs. ID was consulted for further abx narrowing given her complex hx, drug allergies, and many drug-drug interactions. ID recommended stopping cefepime and transitioning from vanc to another course of tedizolid to complete a 14 day course of abx coverage for MRSA.    Of note, with regard to patient's brief norepi requirement, believe patient's low MAPs may have been multifactorial. [name] did not have her typical high caffeine intake, missed phentermine doses for 48 hours, and normally has low systolics in 100s. She also may have potential undiagnosed POTS in the setting of her EDS hx. Patient was asymptomatic when her MAPs were in the 50s and reported after the fact that she has had systolics in the 70s at home before and been symptomatic without simultaneous infections present.      ##Left anterior thigh wound and closing abscess: No surrounding erythema or significant tenderness in this area. Continued to follow drainage and dressed wound during admission.      Continued all chronic home medications aside from phentermine during admission.        Procedures:    None    ___________________________________________________________________  Discharge Day Services:    Pt seen on the day of discharge and determined appropriate for discharge.    Day of Discharge Services:    Subjective:     Interval History: feeling well, states tedizolid is to be delivered to hospital room at 4 PM today.     ROS negative for fever, chills, night sweats, nausea, vomiting, diarrhea, constipation, chest pain, SOB, DOE, Abdominal pain, dysuria, rash, vision changes, headache, unilateral weakness, numbness or tingling    Objective:     Vital signs in last 24 hours:  Temp:  [36.2 ??C (97.2 ??F)-36.7 ??C (98.1 ??F)] 36.2 ??C (97.2 ??F)  Heart Rate:  [58-64] 58  Resp:  [16-18] 18  BP: (  94-132)/(52-77) 132/77  MAP (mmHg):  [65-92] 92  SpO2:  [98 %-100 %] 98 %    Intake/Output last 3 shifts:  I/O last 3 completed shifts:  In: 1690 [P.O.:840; IV Piggyback:850]  Out: 1000 [Stool:1000]    Weight:   Wt Readings from Last 4 Encounters:   04/30/21 (!) 108.4 kg (239 lb)   04/18/21 (!) 108.7 kg (239 lb 9.6 oz)   04/04/21 (!) 109.4 kg (241 lb 2.9 oz)   03/28/21 (!) 111.8 kg (246 lb 6.4 oz)       Physical Exam  Constitutional:       General: She is not in acute distress.     Appearance: Normal appearance.   Cardiovascular:      Rate and Rhythm: Normal rate and regular rhythm.      Pulses: Normal pulses.      Heart sounds: Normal heart sounds. No murmur heard.    No friction rub. No gallop.   Pulmonary:      Effort: Pulmonary effort is normal. No respiratory distress.      Breath sounds: Normal breath sounds. No wheezing, rhonchi or rales.   Abdominal:      General: Abdomen is flat. Bowel sounds are normal. There is no distension.      Palpations: Abdomen is soft.      Tenderness: There is no abdominal tenderness.   Musculoskeletal:      Right lower leg: No edema.      Left lower leg: No edema.   Skin:     General: Skin is warm.      Capillary Refill: Capillary refill takes less than 2 seconds.      Comments: See image below, erythema much improved from prior   Neurological:      General: No focal deficit present.      Mental Status: She is alert. Mental status is at baseline.   Psychiatric:         Mood and Affect: Mood normal.       Condition at Discharge: good    Length of Discharge: I spent greater than 30 mins in the discharge of this patient.  ___________________________________________________________________  Discharge Medications:         Your Medication List        STOP taking these medications      imiquimod 5 % cream  Commonly known as: ALDARA     oxyCODONE 5 MG immediate release tablet  Commonly known as: ROXICODONE            START taking these medications      tedizolid 200 mg Tab  Take 1 tablet (200 mg total) by mouth daily for 12 days.     SIVEXTRO 200 mg Tab  Generic drug: tedizolid  Take 1 tablet (200 mg total) by mouth daily for 12 days.            CONTINUE taking these medications      acetaminophen 500 MG tablet  Commonly known as: TYLENOL  Take 2 tablets (1,000 mg total) by mouth every eight (8) hours.     ADAPT STOMA POWDER Powd  Generic drug: ostomy supplies  Use as directed     SENSURA FLEX OSTOMY POUCH Misc  Generic drug: ostomy supplies  Use as directed     ARIPiprazole 5 MG tablet  Commonly known as: ABILIFY  Take 1 tablet (5 mg total) by mouth daily.     ASSURA OSTOMY BELT Misc  Generic drug: colostomy belt  Use as directed     azelastine 137 mcg (0.1 %) nasal spray  Commonly known as: ASTELIN  2 sprays into each nostril Two (2) times a day. Use in each nostril as directed     celecoxib 200 MG capsule  Commonly known as: CeleBREX  Take 1 capsule (200 mg total) by mouth daily.     chlorhexidine 4 % external liquid  Commonly known as: HIBICLENS  Apply topically daily. Follow instructions provided to you at your visit.     COLOPLAST BARRIER RING 2  Misc  Generic drug: colostomy washer  Use as directed     DAKIN'S SOLUTION 0.125 % Soln  Generic drug: sodium hypochlorite  Apply topically daily. Use as a wound cleanser, pat dry, then apply dressings.     diclofenac sodium 1 % gel  Commonly known as: VOLTAREN  Apply 2 g topically four (4) times a day as needed for pain or arthritis.     ergocalciferol-1,250 mcg (50,000 unit) 1,250 mcg (50,000 unit) capsule  Commonly known as: DRISDOL  Take 1 capsule (1,250 mcg total) by mouth Two (2) times a week.     FeroSuL 325 (65 FE) MG tablet  Generic drug: ferrous sulfate  Take 1 tablet (325 mg total) by mouth every other day.     fluconazole 150 MG tablet  Commonly known as: DIFLUCAN  Take 1 tablet (150 mg total) by mouth once as needed (for yeast infection). Can repeat in 7 days if symptoms have not improved.     folic acid 1 MG tablet  Commonly known as: FOLVITE  Take 2 tablets (2 mg total) by mouth daily.     gabapentin 100 MG capsule  Commonly known as: NEURONTIN  Take 1 capsule (100 mg total) by mouth two (2) times a day.     lidocaine 5 % ointment  Commonly known as: XYLOCAINE  Apply topically Three (3) times a day.     LORazepam 0.5 MG tablet  Commonly known as: ATIVAN  Take 2 tablets (1 mg total) by mouth every eight (8) hours as needed for anxiety.     NO STING BARRIER FILM Padm  Generic drug: skin protectants, misc.  Use as directed omeprazole 20 MG capsule  Commonly known as: PriLOSEC  Take 1 capsule (20 mg total) by mouth in the morning.     phentermine 30 MG capsule  Take 1 capsule (30 mg total) by mouth every morning.     SENSI-CARE ADHESI REMOVER WIPE Misc  Generic drug: adhesive remover  Use as directed     SYNTHROID 50 MCG tablet  Generic drug: levothyroxine  Take 1 tablet (50 mcg total) by mouth daily.     topiramate 25 MG tablet  Commonly known as: TOPAMAX  Take 2 tablets (50 mg total) by mouth Two (2) times a day.     venlafaxine 150 MG 24 hr capsule  Commonly known as: EFFEXOR-XR  Take 2 capsules (300 mg total) by mouth daily. Takes total of 375mg  daily     venlafaxine 75 MG 24 hr capsule  Commonly known as: EFFEXOR-XR  Take 1 capsule (75 mg total) by mouth daily. Takes total of 375mg  daily            ___________________________________________________________________  Hospital Tests/Labs:    Pending Test Results (if blank, then none):  Pending Labs       Order Current Status    Blood Culture #2 Preliminary result  Hospital Radiology:  No results found.    Most Recent Labs:  Recent Labs   Lab Units 05/03/21  0640 05/02/21  0700 05/01/21  0629   WBC 10*9/L 5.3 5.8 5.9   HEMOGLOBIN g/dL 16.1* 09.6* 04.5*   HEMATOCRIT % 32.0* 30.5* 30.5*   MCV fL 91.3 90.6 90.7   PLATELET COUNT (1) 10*9/L 152 130* 134*     Recent Labs   Lab Units 05/03/21  0640 05/02/21  0700 05/01/21  0629 04/30/21  1533   SODIUM mmol/L 139 140 137 135   POTASSIUM mmol/L 3.8 4.0 3.3* 3.6   CHLORIDE mmol/L 114* 116* 113* 108*   CO2 mmol/L 18.0* 17.0* 17.0* 18.0*   BUN mg/dL 9 12 12 18    CREATININE mg/dL 4.09 8.11 9.14 7.82   GLUCOSE mg/dL 91 88 83 72*   CALCIUM mg/dL 7.9* 7.6* 7.1* 8.5   ALBUMIN g/dL  --   --   --  3.7   ALT U/L  --   --   --  31   AST U/L  --   --   --  33   ALK PHOS U/L  --   --   --  105   BILIRUBIN TOTAL mg/dL  --   --   --  0.6   PROTEIN TOTAL g/dL  --   --   --  6.9   TSH uIU/mL  --   --   --  0.799     Recent Labs   Lab Units 04/30/21  1533   LACTATE mmol/L 1.5     No results in the last week  No results in the last week    Invalid input(s): CK  No results in the last week  No results in the last week    Recent Labs     05/01/21  0715   WBCUA >10*   NITRITE Negative   LEUKOCYTESUR Moderate*   BACTERIA Moderate*   RBCUA 2   BLOODU Trace*   GLUCOSEU Negative   PROTEINUA Negative   KETONESU Negative     No results for input(s): PREGTESTUR in the last 72 hours.  No results for input(s): OPIAU, BENZU, TRICYCLIC, PCPU, AMPHU, COCAU, CANNAU, BARBU, ETOH, ACETAMIN, SALICYLATE in the last 72 hours.  Microbiology Results (last day)       Procedure Component Value Date/Time Date/Time    Blood Culture #2 [[id number]]  (Normal) Collected: 04/30/21 1533    Lab Status: Preliminary result Specimen: Blood from 1 Peripheral Draw Updated: 05/02/21 1545     Blood Culture, Routine No Growth at 48 hours          ___________________________________________________________________  Discharge Instructions      Activity Instructions       Activity as tolerated              Other Instructions       Discharge instructions      You were in the hospital for cellulitis on your left thigh.  We are treating this with tedizolid after you responded very well to IV vancomycin while in the hospital.  Please continue taking this medication for the next 12 days to complete your antibiotic course and follow-up with your primary care doctor during that time to ensure you continue to progress.  Please do not take your phentermine while you are taking the tedizolid, but you can resume this medicine once you have finished the antibiotic.          Follow  Up instructions and Outpatient Referrals     Discharge instructions        Appointments which have been scheduled for you      May 10, 2021 11:30 AM  (Arrive by 11:20 AM)  RETURN VIDEO MYCHART with Illene Silver, MD  [facility] PSYCHIATRY [facility] AT [facility] REGION) [address]  [address]  [place] [address]  [phone]   Please sign into My [place] Chart at least 15 minutes before your appointment to complete the eCheck-In process. You must complete eCheck-In before you can start your video visit. We also recommend testing your audio and video connection to troubleshoot any issues before your visit begins. Click ???Join Video Visit??? to complete these checks. Once you have completed eCheck-In and tested your audio and video, click ???Join Call??? to connect to your visit.     For your video visit, you will need a computer with a working camera, speaker and microphone, a smartphone, or a tablet with internet access.    My [place] Chart enables you to manage your health, send non-urgent messages to your provider, view your test results, schedule and manage appointments, and request prescription refills securely and conveniently from your computer or mobile device.    You can go to [email or web address] to sign in to your My [place] Chart account with your username and password. If you have forgotten your username or password, please choose the ???Forgot Username???? and/or ???Forgot Password???? links to gain access. You also can access your My [place] Chart account with the free MyChart mobile app for Android or iPhone.    If you need assistance accessing your My [place] Chart account or for assistance in reaching your provider's office to reschedule or cancel your appointment, please call [facility] [phone].           May 10, 2021  2:30 PM  (Arrive by 2:15 PM)  RETURN  RHEUMATOLOGY with Staci Righter, PA  [facility] RHEUMATOLOGY SPECIALTY [place] [facility]) [address]  [place] [address]  [phone]        May 12, 2021  DESTRUCTION OF LESION(S), VULVA; SIMPLE (EG, LASER SURGERY, ELECTROSURGERY, CRYOSURGERY, CHEMOSURGERY) with Tamsen Roers, MD PhD  PERIOP HBR [facility]) [address]  [place] [address]  [phone]     May 19, 2021 10:30 AM  (Arrive by 10:15 AM)  RETURN WOUND with Barbette Or, PA  [facility] HEART VASCULAR CENTER WOUND [place] [facility] REGION) [address]  [address]  [place] [address]  [phone]        Jun 14, 2021 10:40 AM  (Arrive by 10:25 AM)  RETURN GYN LATINO with Tamsen Roers, MD PhD  [facility] GENERAL OB GYN WEAVER CROSSING [facility]) [address]  [address]  [place] [address]  [phone]        Jun 27, 2021  3:30 PM  (Arrive by 3:15 PM)  RETURN  ALLERGY with Alexander Mt, MD  [facility] ALLERGY [place] [facility] REGION) [address]  [place] [address]  [phone]        Aug 01, 2021  1:45 PM  (Arrive by 1:30 PM)  OFFICE VISIT with Sophia Leanord Hawking, MD  [facility] PEDS AND INTERNAL MEDICINE [facility] REGION) [address]  [address]  [place] [address]  [phone]             ___________________________________________________________________

## 2021-05-03 NOTE — Unmapped (Signed)
[facility] INFECTIOUS DISEASES INPATIENT E-CONSULT      Julia Evans is being seen in consultation at the request of Bernette Redbird, MD for evaluation and management of complicated MRSA SSTI.     Recommendations:     ??? Tedizolid 200mg  daily to complete a 14 day total course of antimicrobials  ??? Follow up with Dr. Orson Slick in ID clinic (requested)    Plan reviewed with primary team.            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 She is allergic to Bactrim and requires medications with known DDI with linezolid, leaving few options for outpatient therapy. Due to her limited options considering antimicrobial resistance, allergies and difficulty with access, and based on prior extensive discussions about antimicrobial options, it is my recommendation that she receive a 14 day course of antibiotics to be completed with tedizolid, with follow up in ID clinic with Dr. Orson Slick.     I spent 60 minutes in medical consultative discussion and review of medical records, including a written report to the treating provider via electronic health record regarding the condition of this patient.    If you have questions or concerns about this plan, please feel free to contact me through Epic by staff message or chat.    Dorene Grebe, MD  Division of Infectious Diseases    The recommendations provided in this eConsult are based on the clinical data available to me and are furnished without the benefit of a comprehensive in-person evaluation of the patient. Any new clinical issues or changes in patient status not available to me will need to be taken into account when assessing these recommendations. The ongoing management of this patient is the responsibility of the referring clinician. Please contact me if you have further questions.  _____________________________________________________________________________          Initial Consult Documentation from May 03, 2021     You asked:  Treatment for drug-resistant MRSA skin and soft tissue infection.     Sources of information include: chart review, discussion with providers.     I reviewed and summarized old records and reviewed lab and imaging results, and my impression is described in the HPI.       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 She was admitted on 04/30/21 with nonpurulent cellulitis of the left thigh, and received vancomycin, cefepime and flagyl due to initial concern for sepsis (mild hypotension), which was de-escalated to vancomycin alone, with good clinical improvement. Prior culture data has shown MRSA resistant to clinda and doxy, susceptible to vancomycin, linezolid and Bactrim. Unfortunately she is allergic to Bactrim and requires medications with known DDI with linezolid, leaving few options for outpatient therapy. She has previsouly been treated with tedizolid for 2 weeks with resolution of infection. For detailed history, please see note dated 03/28/21 by Dr. Orson Slick.     This patient has limited options due to resistance, allergies and difficulty with access. Antimicrobial options have been discussed extensively in  the past, including with pharmacy, and patient has been following with Dr. Orson Slick in ID clinic.       Review of Systems  As per HPI.     Past Medical History   Ehlers-Danlos syndrome  Anxiety/depression  GERD  Anemia  Class 3 obesity  Hypothyroidism  Status post ileostomy    Meds and Allergies  Patient has a current medication list which includes the following prescription(s): acetaminophen, sensi-care adhesi remover wipe, aripiprazole, azelastine, celecoxib, chlorhexidine, assura ostomy belt, coloplast barrier ring, diclofenac sodium, ergocalciferol-1,250 mcg (50,000 unit), ferrous sulfate, fluconazole, folic acid, gabapentin, levothyroxine, lidocaine, lorazepam, omeprazole, ostomy supplies, ostomy supplies, oxycodone, phentermine, no sting barrier film, sodium hypochlorite, topiramate, venlafaxine, venlafaxine, imiquimod, sivextro, and tedizolid, and the following Facility-Administered Medications: acetaminophen, aripiprazole, celecoxib, diclofenac sodium, senna **AND** docusate sodium, enoxaparin, ferrous sulfate, folic acid, gabapentin, levothyroxine, lorazepam, melatonin, pantoprazole, topiramate, vancomycin (VANCOCIN) 1,000 mg in sodium chloride (NS) 0.9 % 250 mL vialmate, venlafaxine.    Allergies: Cashew nut, Morphine, Sulfamethoxazole-trimethoprim, Tree nuts, and Adhesive tape-silicones      Family History   Family History   Problem Relation Age of Onset   ??? Stroke Mother    ??? Hypertension Mother    ??? Depression Mother    ??? Diabetes Father    ??? Thyroid disease Father    ??? Hypertension Father    ??? Idiopathic pulmonary fibrosis Father    ??? Depression Sister    ??? Stroke Brother    ??? No Known Problems Brother    ??? Diabetes Maternal Grandfather    ??? Emphysema Maternal Grandfather    ??? Emphysema Maternal Grandmother    ??? Leukemia Paternal Grandfather    ??? Idiopathic pulmonary fibrosis Paternal Grandmother    ??? Hyperthyroidism Daughter    ??? Mental illness Daughter    ??? No Known Problems Son    ??? No Known Problems Son    ??? Ehlers-Danlos syndrome Granddaughter    ??? Ehlers-Danlos syndrome Granddaughter    ??? BRCA 1/2 Neg Hx    ??? Breast cancer Neg Hx    ??? Cancer Neg Hx    ??? Colon cancer Neg Hx    ??? Endometrial cancer Neg Hx    ??? Ovarian cancer Neg Hx        Social History  Social History Socioeconomic History   ??? Marital status: Single     Spouse name: None   ??? Number of children: None   ??? Years of education: None   ??? Highest education level: None   Occupational History   ??? Occupation: Customer service   Tobacco Use   ??? Smoking status: Never   ??? Smokeless tobacco: Never   Vaping Use   ??? Vaping Use: Never used   Substance and Sexual Activity   ??? Alcohol use: Not Currently     Comment: Occassionally   ??? Drug use: Never   ??? Sexual activity: Not Currently     Partners: Male     Birth control/protection: Bilateral Tubal Ligation   Other Topics Concern   ??? Do you use sunscreen? No   ??? Tanning bed use? No   ??? Are you easily burned? No   ??? Excessive sun exposure? No   ??? Blistering sunburns? No     Social Determinants of Health     Financial Resource Strain: Low Risk    ??? Difficulty of Paying Living Expenses: Not hard at all   Food Insecurity: No Food Insecurity   ??? Worried About  Running Out of Food in the Last Year: Never true   ??? Ran Out of Food in the Last Year: Never true   Transportation Needs: No Transportation Needs   ??? Lack of Transportation (Medical): No   ??? Lack of Transportation (Non-Medical): No   Physical Activity: Inactive   ??? Days of Exercise per Week: 0 days   ??? Minutes of Exercise per Session: 0 min   Stress: Stress Concern Present   ??? Feeling of Stress : Very much   Social Connections: Socially Isolated   ??? Frequency of Communication with Friends and Family: More than three times a week   ??? Frequency of Social Gatherings with Friends and Family: More than three times a week   ??? Attends Religious Services: Never   ??? Active Member of Clubs or Organizations: No   ??? Attends Banker Meetings: Never   ??? Marital Status: Divorced       Antimicrobials & Other Medications   Currently on vancomycin    Current Medications as of 05/03/2021  Scheduled  PRN   ARIPiprazole, 5 mg, Daily  celecoxib, 200 mg, Daily  enoxaparin (LOVENOX) injection, 40 mg, Nightly  ferrous sulfate, 325 mg, Every other day  folic acid, 2 mg, Daily  gabapentin, 100 mg, BID  levothyroxine, 50 mcg, daily  pantoprazole, 20 mg, Nightly  topiramate, 50 mg, BID  vancomycin, 1,000 mg, Q12H  venlafaxine, 375 mg, Daily      acetaminophen, 1,000 mg, Q8H PRN  diclofenac sodium, 2 g, QID PRN  senna, 1 tablet, BID PRN   And  docusate sodium, 100 mg, BID PRN  LORazepam, 0.5 mg, BID PRN  melatonin, 3 mg, Nightly PRN           Physical Exam  Temp:  [36.2 ??C (97.2 ??F)-36.7 ??C (98.1 ??F)] 36.2 ??C (97.2 ??F)  Heart Rate:  [58-64] 58  Resp:  [16-18] 18  BP: (94-132)/(52-77) 132/77  MAP (mmHg):  [65-92] 92  SpO2:  [98 %-100 %] 98 %    Actual body weight: (!) 108.4 kg (239 lb)   Ideal body weight: 59.3 kg (130 lb 11.7 oz)  Adjusted ideal body weight: 78.9 kg (174 lb 0.6 oz)     PE otherwise not performed

## 2021-05-03 NOTE — Unmapped (Signed)
Ultrasound-Guided Peripheral IV Placement (CPT Y420307 and 45409)    Indication: Failed/difficult IV access    Location: R EJ   Is the selected vessel patent?: Yes    Catheter gauge: 20    Catheter length: 1 3/4.    Limitations: None.     Complications: NONE    Interpreted by: Bernardo Heater, MD
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
## 2021-05-04 NOTE — Unmapped (Signed)
Discharge instructions reviewed with patient and prescription given to patient by pharmacy which had been sent to site.   Patient also given work excuse note.  Patient discharged via wheelchair without incident.  Daughter provided transportation home    Problem: Infection  Goal: Absence of Infection Signs and Symptoms  Outcome: Discharged to Home     Problem: Nutrition Impaired (Sepsis/Septic Shock)  Goal: Optimal Nutrition Intake  Outcome: Discharged to Home     Problem: Fall Injury Risk  Goal: Absence of Fall and Fall-Related Injury  Outcome: Discharged to Home     Problem: Infection Progression (Sepsis/Septic Shock)  Goal: Absence of Infection Signs and Symptoms  Outcome: Discharged to Home     Problem: Glycemic Control Impaired (Sepsis/Septic Shock)  Goal: Blood Glucose Level Within Desired Range  Outcome: Discharged to Home     Problem: Bleeding (Sepsis/Septic Shock)  Goal: Absence of Bleeding  Outcome: Discharged to Home     Problem: Adjustment to Illness (Sepsis/Septic Shock)  Goal: Optimal Coping  Outcome: Discharged to Home     Problem: Adult Inpatient Plan of Care  Goal: Plan of Care Review  05/03/2021 1709 by Thomasene Lot, RN  Outcome: Discharged to Home  05/03/2021 1543 by Thomasene Lot, RN  Flowsheets (Taken 05/03/2021 1543)  Progress: improving  Plan of Care Reviewed With: patient  Outcome Evaluation: patient will be discharged today.  Goal: Patient-Specific Goal (Individualized)  05/03/2021 1709 by Thomasene Lot, RN  Outcome: Discharged to Home  05/03/2021 1543 by Thomasene Lot, RN  Flowsheets (Taken 05/03/2021 1543)  Patient-Specific Goals (Include Timeframe): patinent will be discharged if family can arrange to pickup antibiotics for patient's home use  Individualized Care Needs: IV antibiotics, dressing change care,  Anxieties, Fears or Concerns: dressing change needs  Goal: Absence of Hospital-Acquired Illness or Injury  Outcome: Discharged to Home  Goal: Optimal Comfort and Wellbeing  Outcome: Discharged to Home  Goal: Readiness for Transition of Care  Outcome: Discharged to Home  Goal: Rounds/Family Conference  Outcome: Discharged to Home

## 2021-05-06 NOTE — Unmapped (Addendum)
TC to patient to schedule virtual visit via telephone. Patient is aware they will receive three calls near appointment time: one from registration, one from Nurse and one from Provider.    PAT phone visit: 05/10/2021 @ 4pm

## 2021-05-10 ENCOUNTER — Telehealth: Admit: 2021-05-10 | Discharge: 2021-05-10 | Payer: PRIVATE HEALTH INSURANCE

## 2021-05-10 ENCOUNTER — Ambulatory Visit: Admit: 2021-05-10 | Discharge: 2021-05-10 | Payer: PRIVATE HEALTH INSURANCE

## 2021-05-10 DIAGNOSIS — Q796 Ehlers-Danlos syndrome, unspecified: Principal | ICD-10-CM

## 2021-05-10 DIAGNOSIS — Z01818 Encounter for other preprocedural examination: Principal | ICD-10-CM

## 2021-05-10 NOTE — Unmapped (Signed)
Per Anesthesia's guidelines:    Please take the following medications the morning of your procedure with a sip of water:    Abilify  Synthroid  Ativan  Effexor  Tylenol  Neurontin  Topamax

## 2021-05-10 NOTE — Unmapped (Signed)
Specialty Medication(s): Sivextro 200mg     [name] has been dis-enrolled from the [facility] specialty pharmacy services due to therapy completion - expected therapy completion date: 05/14/21 .    Additional information provided to the patient: n/a    Roderic Palau  [facility] Specialty Pharmacist

## 2021-05-10 NOTE — Unmapped (Signed)
Patient did not show for their rheumatology appointment today.

## 2021-05-12 ENCOUNTER — Encounter: Admit: 2021-05-12 | Payer: PRIVATE HEALTH INSURANCE | Attending: Anesthesiology | Primary: Anesthesiology

## 2021-05-12 DIAGNOSIS — A630 Anogenital (venereal) warts: Principal | ICD-10-CM

## 2021-05-19 ENCOUNTER — Ambulatory Visit: Admit: 2021-05-19 | Discharge: 2021-05-20 | Payer: PRIVATE HEALTH INSURANCE

## 2021-05-20 MED ORDER — LEVOTHYROXINE 50 MCG TABLET
ORAL_TABLET | ORAL | 3 refills | 85 days | Status: CP
Start: 2021-05-20 — End: 2022-05-20

## 2021-05-23 ENCOUNTER — Telehealth: Admit: 2021-05-23 | Discharge: 2021-05-24 | Payer: PRIVATE HEALTH INSURANCE

## 2021-05-23 DIAGNOSIS — F4310 Post-traumatic stress disorder, unspecified: Principal | ICD-10-CM

## 2021-05-23 MED ORDER — LORAZEPAM 0.5 MG TABLET
ORAL_TABLET | Freq: Three times a day (TID) | ORAL | 0 refills | 15 days | Status: CP | PRN
Start: 2021-05-23 — End: ?

## 2021-05-23 MED ORDER — ARIPIPRAZOLE 10 MG TABLET
ORAL_TABLET | Freq: Every day | ORAL | 0 refills | 30 days | Status: CP
Start: 2021-05-23 — End: 2021-06-22

## 2021-06-01 ENCOUNTER — Ambulatory Visit: Admit: 2021-06-01 | Payer: PRIVATE HEALTH INSURANCE

## 2021-06-03 MED ORDER — OMEPRAZOLE 20 MG CAPSULE,DELAYED RELEASE
ORAL_CAPSULE | Freq: Every day | ORAL | 3 refills | 90 days
Start: 2021-06-03 — End: 2022-06-03

## 2021-06-03 MED ORDER — TOPIRAMATE 25 MG TABLET
ORAL_TABLET | Freq: Two times a day (BID) | ORAL | 3 refills | 30 days
Start: 2021-06-03 — End: ?

## 2021-06-06 MED ORDER — ERGOCALCIFEROL (VITAMIN D2) 1,250 MCG (50,000 UNIT) CAPSULE
ORAL_CAPSULE | ORAL | 11 refills | 56.00000 days | Status: CP
Start: 2021-06-06 — End: 2022-06-06

## 2021-06-06 MED ORDER — TOPIRAMATE 25 MG TABLET
ORAL_TABLET | Freq: Two times a day (BID) | ORAL | 3 refills | 30 days | Status: CP
Start: 2021-06-06 — End: ?

## 2021-06-06 MED ORDER — OMEPRAZOLE 20 MG CAPSULE,DELAYED RELEASE
ORAL_CAPSULE | Freq: Every day | ORAL | 3 refills | 90 days | Status: CP
Start: 2021-06-06 — End: 2022-06-06

## 2021-06-08 MED ORDER — ARIPIPRAZOLE 10 MG TABLET
ORAL_TABLET | Freq: Every day | ORAL | 2 refills | 90 days | Status: CP
Start: 2021-06-08 — End: 2021-07-08
  Filled 2021-06-27: qty 30, 30d supply, fill #0

## 2021-06-23 ENCOUNTER — Ambulatory Visit: Admit: 2021-06-23 | Payer: PRIVATE HEALTH INSURANCE

## 2021-06-27 ENCOUNTER — Ambulatory Visit
Admit: 2021-06-27 | Discharge: 2021-06-27 | Payer: PRIVATE HEALTH INSURANCE | Attending: Student in an Organized Health Care Education/Training Program | Primary: Student in an Organized Health Care Education/Training Program

## 2021-06-27 ENCOUNTER — Ambulatory Visit: Admit: 2021-06-27 | Discharge: 2021-06-27 | Payer: PRIVATE HEALTH INSURANCE

## 2021-06-27 DIAGNOSIS — J3089 Other allergic rhinitis: Principal | ICD-10-CM

## 2021-06-27 DIAGNOSIS — J310 Chronic rhinitis: Principal | ICD-10-CM

## 2021-06-27 DIAGNOSIS — T781XXD Other adverse food reactions, not elsewhere classified, subsequent encounter: Principal | ICD-10-CM

## 2021-06-27 DIAGNOSIS — L508 Other urticaria: Principal | ICD-10-CM

## 2021-06-27 MED FILL — PHENTERMINE 30 MG CAPSULE: ORAL | 30 days supply | Qty: 30 | Fill #2

## 2021-06-29 DIAGNOSIS — Z789 Other specified health status: Principal | ICD-10-CM

## 2021-07-18 ENCOUNTER — Ambulatory Visit: Admit: 2021-07-18 | Discharge: 2021-07-18 | Payer: PRIVATE HEALTH INSURANCE

## 2021-07-18 DIAGNOSIS — Z789 Other specified health status: Principal | ICD-10-CM

## 2021-07-21 ENCOUNTER — Encounter
Admit: 2021-07-21 | Discharge: 2021-07-21 | Payer: PRIVATE HEALTH INSURANCE | Attending: Anesthesiology | Primary: Anesthesiology

## 2021-07-21 ENCOUNTER — Ambulatory Visit: Admit: 2021-07-21 | Discharge: 2021-07-21 | Payer: PRIVATE HEALTH INSURANCE

## 2021-07-28 MED ORDER — OXYCODONE 5 MG TABLET
ORAL_TABLET | 0 refills | 0.00000 days
Start: 2021-07-28 — End: ?

## 2021-08-10 ENCOUNTER — Ambulatory Visit: Admit: 2021-08-10 | Discharge: 2021-08-11 | Payer: PRIVATE HEALTH INSURANCE

## 2021-08-10 DIAGNOSIS — E038 Other specified hypothyroidism: Principal | ICD-10-CM

## 2021-08-10 DIAGNOSIS — Z9189 Other specified personal risk factors, not elsewhere classified: Principal | ICD-10-CM

## 2021-08-10 DIAGNOSIS — E559 Vitamin D deficiency, unspecified: Principal | ICD-10-CM

## 2021-08-10 DIAGNOSIS — Z6841 Body Mass Index (BMI) 40.0 and over, adult: Principal | ICD-10-CM

## 2021-08-10 DIAGNOSIS — Q796 Ehlers-Danlos syndrome, unspecified: Principal | ICD-10-CM

## 2021-08-10 DIAGNOSIS — R609 Edema, unspecified: Principal | ICD-10-CM

## 2021-08-10 DIAGNOSIS — D649 Anemia, unspecified: Principal | ICD-10-CM

## 2021-08-10 DIAGNOSIS — Z789 Other specified health status: Principal | ICD-10-CM

## 2021-08-10 MED ORDER — PHENTERMINE 30 MG CAPSULE
ORAL_CAPSULE | Freq: Every morning | ORAL | 2 refills | 30 days | Status: CP
Start: 2021-08-10 — End: ?
  Filled 2021-08-17: qty 30, 30d supply, fill #0

## 2021-08-10 MED ORDER — ARIPIPRAZOLE 10 MG TABLET
ORAL_TABLET | Freq: Every day | ORAL | 2 refills | 90 days | Status: CP
Start: 2021-08-10 — End: 2022-05-07

## 2021-08-19 ENCOUNTER — Ambulatory Visit: Admit: 2021-08-19 | Discharge: 2021-08-20 | Payer: PRIVATE HEALTH INSURANCE

## 2021-08-23 ENCOUNTER — Telehealth: Admit: 2021-08-23 | Discharge: 2021-08-24 | Payer: PRIVATE HEALTH INSURANCE

## 2021-08-23 DIAGNOSIS — F411 Generalized anxiety disorder: Principal | ICD-10-CM

## 2021-08-23 DIAGNOSIS — F4310 Post-traumatic stress disorder, unspecified: Principal | ICD-10-CM

## 2021-08-23 MED ORDER — LORAZEPAM 0.5 MG TABLET
ORAL_TABLET | Freq: Three times a day (TID) | ORAL | 0 refills | 15 days | Status: CP | PRN
Start: 2021-08-23 — End: ?

## 2021-08-25 ENCOUNTER — Ambulatory Visit
Admit: 2021-08-25 | Discharge: 2021-08-26 | Payer: PRIVATE HEALTH INSURANCE | Attending: Adult Health | Primary: Adult Health

## 2021-08-25 DIAGNOSIS — I5189 Other ill-defined heart diseases: Principal | ICD-10-CM

## 2021-08-25 DIAGNOSIS — Q796 Ehlers-Danlos syndrome, unspecified: Principal | ICD-10-CM

## 2021-08-25 DIAGNOSIS — G90A POTS (postural orthostatic tachycardia syndrome): Principal | ICD-10-CM

## 2021-09-22 MED FILL — PHENTERMINE 30 MG CAPSULE: ORAL | 30 days supply | Qty: 30 | Fill #1

## 2021-10-10 MED ORDER — TOPIRAMATE 25 MG TABLET
ORAL_TABLET | ORAL | 11 refills | 0 days
Start: 2021-10-10 — End: ?

## 2021-10-11 MED ORDER — TOPIRAMATE 25 MG TABLET
ORAL_TABLET | ORAL | 11 refills | 0 days
Start: 2021-10-11 — End: ?

## 2021-10-24 ENCOUNTER — Telehealth: Admit: 2021-10-24 | Discharge: 2021-10-25 | Payer: PRIVATE HEALTH INSURANCE

## 2021-10-24 DIAGNOSIS — F988 Other specified behavioral and emotional disorders with onset usually occurring in childhood and adolescence: Principal | ICD-10-CM

## 2021-10-24 DIAGNOSIS — F4310 Post-traumatic stress disorder, unspecified: Principal | ICD-10-CM

## 2021-10-24 MED ORDER — LORAZEPAM 0.5 MG TABLET
ORAL_TABLET | Freq: Three times a day (TID) | ORAL | 0 refills | 30.00000 days | Status: CP | PRN
Start: 2021-10-24 — End: ?

## 2021-10-24 MED ORDER — DEXTROAMPHETAMINE-AMPHETAMINE 5 MG TABLET
ORAL_TABLET | Freq: Two times a day (BID) | ORAL | 0 refills | 15 days | Status: CP
Start: 2021-10-24 — End: ?

## 2021-10-26 DIAGNOSIS — L97122 Non-pressure chronic ulcer of left thigh with fat layer exposed: Principal | ICD-10-CM

## 2021-10-26 DIAGNOSIS — M792 Neuralgia and neuritis, unspecified: Principal | ICD-10-CM

## 2021-10-26 MED ORDER — LORAZEPAM 0.5 MG TABLET
ORAL_TABLET | Freq: Three times a day (TID) | ORAL | 0 refills | 30 days | Status: CP | PRN
Start: 2021-10-26 — End: ?

## 2021-11-22 ENCOUNTER — Ambulatory Visit
Admit: 2021-11-22 | Discharge: 2021-11-23 | Payer: PRIVATE HEALTH INSURANCE | Attending: Internal Medicine | Primary: Internal Medicine

## 2021-11-22 DIAGNOSIS — Z6841 Body Mass Index (BMI) 40.0 and over, adult: Principal | ICD-10-CM

## 2021-11-22 MED ORDER — TOPIRAMATE 25 MG TABLET
ORAL_TABLET | 2 refills | 0 days | Status: CP
Start: 2021-11-22 — End: ?

## 2021-11-30 ENCOUNTER — Ambulatory Visit
Admit: 2021-11-30 | Discharge: 2021-12-01 | Payer: PRIVATE HEALTH INSURANCE | Attending: Adult Health | Primary: Adult Health

## 2021-11-30 DIAGNOSIS — G90A POTS (postural orthostatic tachycardia syndrome): Principal | ICD-10-CM

## 2021-11-30 DIAGNOSIS — R609 Edema, unspecified: Principal | ICD-10-CM

## 2021-11-30 DIAGNOSIS — I5189 Other ill-defined heart diseases: Principal | ICD-10-CM

## 2021-11-30 DIAGNOSIS — Q796 Ehlers-Danlos syndrome, unspecified: Principal | ICD-10-CM

## 2021-11-30 DIAGNOSIS — Z7189 Other specified counseling: Principal | ICD-10-CM

## 2021-11-30 MED ORDER — FUROSEMIDE 20 MG TABLET
ORAL_TABLET | Freq: Every day | ORAL | 6 refills | 30 days | Status: CP | PRN
Start: 2021-11-30 — End: 2022-11-30

## 2021-12-06 ENCOUNTER — Telehealth: Admit: 2021-12-06 | Discharge: 2021-12-07 | Payer: PRIVATE HEALTH INSURANCE

## 2021-12-06 DIAGNOSIS — F988 Other specified behavioral and emotional disorders with onset usually occurring in childhood and adolescence: Principal | ICD-10-CM

## 2021-12-06 DIAGNOSIS — F4310 Post-traumatic stress disorder, unspecified: Principal | ICD-10-CM

## 2021-12-06 MED ORDER — LISDEXAMFETAMINE 40 MG CAPSULE
ORAL_CAPSULE | Freq: Every morning | ORAL | 0 refills | 30 days | Status: CP
Start: 2021-12-06 — End: ?

## 2021-12-22 ENCOUNTER — Ambulatory Visit: Admit: 2021-12-22 | Payer: PRIVATE HEALTH INSURANCE

## 2021-12-27 ENCOUNTER — Ambulatory Visit
Admit: 2021-12-27 | Discharge: 2021-12-28 | Payer: PRIVATE HEALTH INSURANCE | Attending: Internal Medicine | Primary: Internal Medicine

## 2021-12-27 DIAGNOSIS — Z6841 Body Mass Index (BMI) 40.0 and over, adult: Principal | ICD-10-CM

## 2021-12-28 ENCOUNTER — Ambulatory Visit: Admit: 2021-12-28 | Discharge: 2021-12-29 | Payer: PRIVATE HEALTH INSURANCE

## 2022-01-05 MED ORDER — LISDEXAMFETAMINE 40 MG CAPSULE
ORAL_CAPSULE | Freq: Every morning | ORAL | 0 refills | 30 days | Status: CP
Start: 2022-01-05 — End: ?

## 2022-01-05 MED ORDER — LORAZEPAM 0.5 MG TABLET
ORAL_TABLET | Freq: Three times a day (TID) | ORAL | 0 refills | 30 days | Status: CP | PRN
Start: 2022-01-05 — End: ?

## 2022-01-31 ENCOUNTER — Other Ambulatory Visit: Payer: Self-pay

## 2022-01-31 ENCOUNTER — Emergency Department
Admission: EM | Admit: 2022-01-31 | Discharge: 2022-01-31 | Disposition: A | Discharge status: Home / Self Care | Payer: Commercial Managed Care - PPO | Attending: Emergency Medicine | Admitting: Emergency Medicine

## 2022-01-31 ENCOUNTER — Emergency Department: Payer: Commercial Managed Care - PPO

## 2022-01-31 DIAGNOSIS — M25361 Other instability, right knee: Secondary | ICD-10-CM

## 2022-01-31 DIAGNOSIS — M25561 Pain in right knee: Secondary | ICD-10-CM

## 2022-01-31 DIAGNOSIS — M2351 Chronic instability of knee, right knee: Secondary | ICD-10-CM | POA: Diagnosis not present

## 2022-01-31 MED ORDER — HYDROCODONE-ACETAMINOPHEN 5-325 MG PO TABS: 1.0000 | ORAL_TABLET | ORAL | 0 refills | Status: AC | PRN

## 2022-01-31 MED ORDER — HYDROCODONE-ACETAMINOPHEN 5-325 MG PO TABS
1.0000 | ORAL_TABLET | Freq: Once | ORAL | Status: AC
  Administered 2022-01-31: 1 via ORAL
  Filled 2022-01-31: qty 1

## 2022-01-31 NOTE — ED Notes (Signed)
First Nurse Note: Patient to ED via [facility] from home for possible knee dislocation. Patient was getting into car when she felt a pop in right knee. Rates pain 5/10.  129/74 88 HR 100%

## 2022-01-31 NOTE — ED Provider Notes (Signed)
[facility] Provider Note   Event Date/Time   First MD Initiated Contact with Patient 01/31/22 1506     (approximate) History  knee dislocation (Right)  HPI Barbara Hawkins is a 47 y.o. female with a past medical history of morbid obesity as well as ligamentous injury to the left knee status post repair who presents for instability in the right knee.  Patient states that she stepped out of the car and felt as though her knee "dislocated".  Patient states that it immediately relocated to the right place whenever she lifts her weight from it.  Patient states that this discomfort is similar to ligamentous injury that she has had on the other knee.  Patient describes 5/10 aching right knee pain that is worse when bearing any weight on the right knee. ROS: Patient currently denies any vision changes, tinnitus, difficulty speaking, facial droop, sore throat, chest pain, shortness of breath, abdominal pain, nausea/vomiting/diarrhea, dysuria, or weakness/numbness/paresthesias in any extremity   Physical Exam  Triage Vital Signs: ED Triage Vitals  Enc Vitals Group     BP 01/31/22 1225 130/82     Pulse Rate 01/31/22 1225 87     Resp 01/31/22 1225 16     Temp 01/31/22 1225 98.4 F (36.9 C)     Temp Source 01/31/22 1225 Oral     SpO2 01/31/22 1225 100 %     Weight --      Height --      Head Circumference --      Peak Flow --      Pain Score 01/31/22 1230 5     Pain Loc --      Pain Edu? --      Excl. in GC? --    Most recent vital signs: Vitals:   01/31/22 1225  BP: 130/82  Pulse: 87  Resp: 16  Temp: 98.4 F (36.9 C)  SpO2: 100%   General: Awake, oriented x4. CV:  Good peripheral perfusion.  Resp:  Normal effort.  Abd:  No distention.  Other:  Morbidly obese middle-aged Caucasian female laying in bed in no acute distress.  Anterior and posterior drawer tests show no instability.  Patient is able to stand on this leg without significant instability however  there is palpable crepitus whenever she attempts to bear weight ED Results / Procedures / Treatments  RADIOLOGY ED MD interpretation: X-ray of the right knee shows no evidence of fracture, dislocation, or effusion -Agree with radiology assessment Official radiology report(s): DG Knee Complete 4 Views Right  Result Date: 01/31/2022 CLINICAL DATA:  Trauma, pain EXAM: RIGHT KNEE - COMPLETE 4+ VIEW COMPARISON:  01/03/2019 FINDINGS: No recent fracture or dislocation is seen. There is no significant effusion. There are numerous scattered soft tissue calcifications with interval increase. IMPRESSION: No fracture or dislocation is seen in right knee. There is no effusion. Electronically Signed   By: Ernie Avena M.D.   On: 01/31/2022 12:54   PROCEDURES: Critical Care performed: No Procedures MEDICATIONS ORDERED IN ED: Medications  HYDROcodone-acetaminophen (NORCO/VICODIN) 5-325 MG per tablet 1 tablet (has no administration in time range)   IMPRESSION / MDM / ASSESSMENT AND PLAN / ED COURSE  I reviewed the triage vital signs and the nursing notes.                             The patient is on the cardiac monitor to evaluate for evidence of arrhythmia  and/or significant heart rate changes. Patient's presentation is most consistent with acute presentation with potential threat to life or bodily function. 47 year old female with the above-stated past medical history presents for right knee instability with mild pain Given history, exam and workup I have low suspicion for fracture, dislocation, significant ligamentous injury, septic arthritis, gout flare, new autoimmune arthropathy, or gonococcal arthropathy.  Interventions: 4 view x-ray of the right knee does not show any evidence of acute bony abnormalities. Physical exam does not show any evidence of significant joint instability.  Patient does have crepitus upon bearing weight on this right knee.  Given this instability and inability to bear  weight, patient be placed in a knee immobilizer as well as crutches with instructions to weight-bear as tolerated prior to follow-up with her orthopedic surgeon, Dr. Fransisco Beau Disposition: Discharge home with strict return precautions and instructions for prompt primary care follow up in the next week.   FINAL CLINICAL IMPRESSION(S) / ED DIAGNOSES   Final diagnoses:  Acute pain of right knee  Knee instability, right   Rx / DC Orders   ED Discharge Orders          Ordered    HYDROcodone-acetaminophen (NORCO) 5-325 MG tablet  Every 4 hours PRN        01/31/22 1542           Note:  This document was prepared using Dragon voice recognition software and may include unintentional dictation errors.   Naaman Plummer, MD 01/31/22 [phone]

## 2022-01-31 NOTE — ED Provider Triage Note (Signed)
Emergency Medicine Provider Triage Evaluation Note  Barbara Hawkins , a 47 y.o. female  was evaluated in triage.  Pt complains of fell getting out of car.  Thinks that she dislocated her right knee.  Hx of same to left knee in past.   Review of Systems  Positive: Right knee pain Negative: No LOC or head injury  Physical Exam  There were no vitals taken for this visit. Gen:   Awake, no distress   Resp:  Normal effort  MSK:   Marked tenderness to right patella, edema.   Other:    Medical Decision Making  Medically screening exam initiated at 12:26 PM.  Appropriate orders placed.  Barbara Hawkins was informed that the remainder of the evaluation will be completed by another provider, this initial triage assessment does not replace that evaluation, and the importance of remaining in the ED until their evaluation is complete.     Johnn Hai, PA-C 01/31/22 1228

## 2022-01-31 NOTE — ED Notes (Signed)
Pt placed in knee immbolizer. Verbalized and demonstrates proper use of immobilizer.  Pt given crutches. Pt demonstrates correct use of crutches.  Leaves in wheelchair with stable vitals. All questions answered.

## 2022-01-31 NOTE — ED Triage Notes (Signed)
Pt presents to ED with c/o of R knee dislocation. Pt states HX of L but not R. Pt states she was getting out of her car. No fall noted.

## 2022-02-01 ENCOUNTER — Ambulatory Visit: Admit: 2022-02-01 | Discharge: 2022-02-02 | Payer: PRIVATE HEALTH INSURANCE

## 2022-02-01 DIAGNOSIS — S8991XA Unspecified injury of right lower leg, initial encounter: Principal | ICD-10-CM

## 2022-02-01 MED ORDER — TRAMADOL 50 MG TABLET
ORAL_TABLET | Freq: Three times a day (TID) | ORAL | 0 refills | 5 days | Status: CP | PRN
Start: 2022-02-01 — End: 2022-02-06

## 2022-02-08 ENCOUNTER — Ambulatory Visit
Admit: 2022-02-08 | Discharge: 2022-02-09 | Payer: PRIVATE HEALTH INSURANCE | Attending: Rehabilitative and Restorative Service Providers" | Primary: Rehabilitative and Restorative Service Providers"

## 2022-02-08 MED ORDER — TRAMADOL 50 MG TABLET
ORAL_TABLET | 0 refills | 0 days | Status: CP
Start: 2022-02-08 — End: ?

## 2022-02-09 MED ORDER — LISDEXAMFETAMINE 40 MG CAPSULE
ORAL_CAPSULE | Freq: Every morning | ORAL | 0 refills | 30 days | Status: CP
Start: 2022-02-09 — End: ?

## 2022-02-09 MED ORDER — LORAZEPAM 0.5 MG TABLET
ORAL_TABLET | Freq: Three times a day (TID) | ORAL | 0 refills | 30 days | Status: CP | PRN
Start: 2022-02-09 — End: ?

## 2022-02-13 ENCOUNTER — Ambulatory Visit
Admit: 2022-02-13 | Discharge: 2022-02-14 | Payer: PRIVATE HEALTH INSURANCE | Attending: Internal Medicine | Primary: Internal Medicine

## 2022-02-13 DIAGNOSIS — Z1239 Encounter for other screening for malignant neoplasm of breast: Principal | ICD-10-CM

## 2022-02-13 DIAGNOSIS — Z0000 Encounter for general adult medical examination without abnormal findings: Principal | ICD-10-CM

## 2022-02-13 DIAGNOSIS — E038 Other specified hypothyroidism: Principal | ICD-10-CM

## 2022-02-13 DIAGNOSIS — M792 Neuralgia and neuritis, unspecified: Principal | ICD-10-CM

## 2022-02-13 DIAGNOSIS — Z136 Encounter for screening for cardiovascular disorders: Principal | ICD-10-CM

## 2022-02-13 DIAGNOSIS — E559 Vitamin D deficiency, unspecified: Principal | ICD-10-CM

## 2022-02-13 DIAGNOSIS — Z6841 Body Mass Index (BMI) 40.0 and over, adult: Principal | ICD-10-CM

## 2022-02-13 DIAGNOSIS — N926 Irregular menstruation, unspecified: Principal | ICD-10-CM

## 2022-02-13 DIAGNOSIS — L97122 Non-pressure chronic ulcer of left thigh with fat layer exposed: Principal | ICD-10-CM

## 2022-02-13 MED ORDER — GABAPENTIN 100 MG CAPSULE
ORAL_CAPSULE | Freq: Two times a day (BID) | ORAL | 3 refills | 90 days | Status: CP
Start: 2022-02-13 — End: 2023-02-13

## 2022-02-14 DIAGNOSIS — R748 Abnormal levels of other serum enzymes: Principal | ICD-10-CM

## 2022-02-22 DIAGNOSIS — R748 Abnormal levels of other serum enzymes: Principal | ICD-10-CM

## 2022-03-01 ENCOUNTER — Ambulatory Visit: Admit: 2022-03-01 | Discharge: 2022-03-01 | Payer: PRIVATE HEALTH INSURANCE

## 2022-03-03 ENCOUNTER — Ambulatory Visit
Admit: 2022-03-03 | Discharge: 2022-03-04 | Payer: PRIVATE HEALTH INSURANCE | Attending: Rehabilitative and Restorative Service Providers" | Primary: Rehabilitative and Restorative Service Providers"

## 2022-03-06 ENCOUNTER — Telehealth: Admit: 2022-03-06 | Discharge: 2022-03-07 | Payer: PRIVATE HEALTH INSURANCE

## 2022-03-06 DIAGNOSIS — F4310 Post-traumatic stress disorder, unspecified: Principal | ICD-10-CM

## 2022-03-06 DIAGNOSIS — F988 Other specified behavioral and emotional disorders with onset usually occurring in childhood and adolescence: Principal | ICD-10-CM

## 2022-03-06 MED ORDER — LISDEXAMFETAMINE 60 MG CAPSULE
ORAL_CAPSULE | Freq: Every morning | ORAL | 0 refills | 30 days | Status: CP
Start: 2022-03-06 — End: ?

## 2022-03-10 DIAGNOSIS — R928 Other abnormal and inconclusive findings on diagnostic imaging of breast: Principal | ICD-10-CM

## 2022-03-14 DIAGNOSIS — R928 Other abnormal and inconclusive findings on diagnostic imaging of breast: Principal | ICD-10-CM

## 2022-03-27 ENCOUNTER — Ambulatory Visit
Admit: 2022-03-27 | Discharge: 2022-04-25 | Payer: PRIVATE HEALTH INSURANCE | Attending: Rehabilitative and Restorative Service Providers" | Primary: Rehabilitative and Restorative Service Providers"

## 2022-03-27 ENCOUNTER — Ambulatory Visit
Admit: 2022-03-27 | Payer: PRIVATE HEALTH INSURANCE | Attending: Rehabilitative and Restorative Service Providers" | Primary: Rehabilitative and Restorative Service Providers"

## 2022-03-30 MED ORDER — LEVOTHYROXINE 50 MCG TABLET
ORAL_TABLET | 3 refills | 0 days | Status: CP
Start: 2022-03-30 — End: ?

## 2022-04-13 MED ORDER — LORAZEPAM 0.5 MG TABLET
ORAL_TABLET | Freq: Three times a day (TID) | ORAL | 0 refills | 30 days | Status: CP | PRN
Start: 2022-04-13 — End: ?

## 2022-04-17 ENCOUNTER — Telehealth: Admit: 2022-04-17 | Discharge: 2022-04-18 | Payer: PRIVATE HEALTH INSURANCE

## 2022-04-17 DIAGNOSIS — F4310 Post-traumatic stress disorder, unspecified: Principal | ICD-10-CM

## 2022-04-17 DIAGNOSIS — F988 Other specified behavioral and emotional disorders with onset usually occurring in childhood and adolescence: Principal | ICD-10-CM

## 2022-04-17 MED ORDER — LISDEXAMFETAMINE 60 MG CAPSULE
ORAL_CAPSULE | Freq: Every morning | ORAL | 0 refills | 30 days | Status: CP
Start: 2022-04-17 — End: ?

## 2022-04-17 MED ORDER — ERGOCALCIFEROL (VITAMIN D2) 1,250 MCG (50,000 UNIT) CAPSULE
ORAL_CAPSULE | 3 refills | 0 days
Start: 2022-04-17 — End: ?

## 2022-04-18 MED ORDER — ERGOCALCIFEROL (VITAMIN D2) 1,250 MCG (50,000 UNIT) CAPSULE
ORAL_CAPSULE | 3 refills | 0 days
Start: 2022-04-18 — End: ?

## 2022-04-19 MED ORDER — ERGOCALCIFEROL (VITAMIN D2) 1,250 MCG (50,000 UNIT) CAPSULE
ORAL_CAPSULE | 3 refills | 0 days | Status: CP
Start: 2022-04-19 — End: ?

## 2022-04-28 ENCOUNTER — Ambulatory Visit
Admit: 2022-04-28 | Discharge: 2022-04-29 | Payer: PRIVATE HEALTH INSURANCE | Attending: Rehabilitative and Restorative Service Providers" | Primary: Rehabilitative and Restorative Service Providers"

## 2022-04-28 MED ORDER — HYDROCODONE 5 MG-ACETAMINOPHEN 325 MG TABLET
ORAL_TABLET | Freq: Four times a day (QID) | ORAL | 0 refills | 5 days | Status: CP | PRN
Start: 2022-04-28 — End: ?

## 2022-05-04 MED ORDER — TOPIRAMATE 50 MG TABLET
ORAL_TABLET | Freq: Two times a day (BID) | ORAL | 6 refills | 30 days | Status: CP
Start: 2022-05-04 — End: ?

## 2022-05-04 MED ORDER — VENLAFAXINE ER 150 MG CAPSULE,EXTENDED RELEASE 24 HR
ORAL_CAPSULE | 7 refills | 0 days | Status: CP
Start: 2022-05-04 — End: ?

## 2022-05-04 MED ORDER — VENLAFAXINE ER 75 MG CAPSULE,EXTENDED RELEASE 24 HR
ORAL_CAPSULE | 3 refills | 0 days | Status: CP
Start: 2022-05-04 — End: ?

## 2022-05-10 ENCOUNTER — Ambulatory Visit
Admit: 2022-05-10 | Discharge: 2022-05-25 | Payer: PRIVATE HEALTH INSURANCE | Attending: Rehabilitative and Restorative Service Providers" | Primary: Rehabilitative and Restorative Service Providers"

## 2022-05-10 ENCOUNTER — Ambulatory Visit
Admit: 2022-05-10 | Payer: PRIVATE HEALTH INSURANCE | Attending: Rehabilitative and Restorative Service Providers" | Primary: Rehabilitative and Restorative Service Providers"

## 2022-05-29 MED ORDER — LISDEXAMFETAMINE 60 MG CAPSULE
ORAL_CAPSULE | Freq: Every morning | ORAL | 0 refills | 30 days | Status: CP
Start: 2022-05-29 — End: ?

## 2022-06-12 ENCOUNTER — Telehealth: Admit: 2022-06-12 | Discharge: 2022-06-13 | Payer: PRIVATE HEALTH INSURANCE

## 2022-06-12 DIAGNOSIS — F4310 Post-traumatic stress disorder, unspecified: Principal | ICD-10-CM

## 2022-06-12 MED ORDER — ARIPIPRAZOLE 15 MG TABLET
ORAL_TABLET | Freq: Every day | ORAL | 0 refills | 30.00000 days | Status: CP
Start: 2022-06-12 — End: 2022-07-12

## 2022-06-12 MED ORDER — LISDEXAMFETAMINE 60 MG CAPSULE
ORAL_CAPSULE | Freq: Every morning | ORAL | 0 refills | 30 days | Status: CP
Start: 2022-06-12 — End: ?

## 2022-06-12 MED ORDER — LORAZEPAM 0.5 MG TABLET
ORAL_TABLET | Freq: Three times a day (TID) | ORAL | 0 refills | 30 days | Status: CP | PRN
Start: 2022-06-12 — End: ?

## 2022-07-09 MED ORDER — FUROSEMIDE 20 MG TABLET
ORAL_TABLET | Freq: Every day | ORAL | 6 refills | 30 days | PRN
Start: 2022-07-09 — End: 2023-07-09

## 2022-07-10 ENCOUNTER — Telehealth: Admit: 2022-07-10 | Discharge: 2022-07-11 | Payer: PRIVATE HEALTH INSURANCE

## 2022-07-10 DIAGNOSIS — F988 Other specified behavioral and emotional disorders with onset usually occurring in childhood and adolescence: Principal | ICD-10-CM

## 2022-07-10 DIAGNOSIS — F4310 Post-traumatic stress disorder, unspecified: Principal | ICD-10-CM

## 2022-07-10 MED ORDER — FUROSEMIDE 20 MG TABLET
ORAL_TABLET | Freq: Every day | ORAL | 6 refills | 30 days | Status: CP | PRN
Start: 2022-07-10 — End: 2023-07-10

## 2022-07-10 MED ORDER — LISDEXAMFETAMINE 70 MG CAPSULE
ORAL_CAPSULE | Freq: Every morning | ORAL | 0 refills | 30 days | Status: CP
Start: 2022-07-10 — End: ?

## 2022-07-31 ENCOUNTER — Ambulatory Visit
Admit: 2022-07-31 | Discharge: 2022-08-01 | Payer: PRIVATE HEALTH INSURANCE | Attending: Internal Medicine | Primary: Internal Medicine

## 2022-07-31 DIAGNOSIS — Z6841 Body Mass Index (BMI) 40.0 and over, adult: Principal | ICD-10-CM

## 2022-07-31 MED ORDER — WEGOVY 0.25 MG/0.5 ML SUBCUTANEOUS PEN INJECTOR
SUBCUTANEOUS | 0 refills | 0 days | Status: CP
Start: 2022-07-31 — End: ?

## 2022-08-01 MED ORDER — ARIPIPRAZOLE 15 MG TABLET
ORAL_TABLET | Freq: Every day | ORAL | 0 refills | 0 days
Start: 2022-08-01 — End: ?

## 2022-08-02 MED ORDER — ARIPIPRAZOLE 15 MG TABLET
ORAL_TABLET | Freq: Every day | ORAL | 0 refills | 30 days | Status: CP
Start: 2022-08-02 — End: 2022-08-02

## 2022-08-03 MED ORDER — VENLAFAXINE ER 75 MG CAPSULE,EXTENDED RELEASE 24 HR
ORAL_CAPSULE | Freq: Every day | ORAL | 3 refills | 90 days | Status: CP
Start: 2022-08-03 — End: 2022-11-01

## 2022-08-03 MED ORDER — VENLAFAXINE ER 150 MG CAPSULE,EXTENDED RELEASE 24 HR
ORAL_CAPSULE | Freq: Every day | ORAL | 7 refills | 90 days | Status: CP
Start: 2022-08-03 — End: 2022-09-02

## 2022-08-04 MED ORDER — VENLAFAXINE ER 75 MG CAPSULE,EXTENDED RELEASE 24 HR
ORAL_CAPSULE | Freq: Every day | ORAL | 2 refills | 30.00000 days | Status: CP
Start: 2022-08-04 — End: 2022-11-02

## 2022-08-04 MED ORDER — VENLAFAXINE ER 150 MG CAPSULE,EXTENDED RELEASE 24 HR
ORAL_CAPSULE | Freq: Every day | ORAL | 2 refills | 30.00000 days | Status: CP
Start: 2022-08-04 — End: 2022-09-03

## 2022-08-24 DIAGNOSIS — F988 Other specified behavioral and emotional disorders with onset usually occurring in childhood and adolescence: Principal | ICD-10-CM

## 2022-08-24 MED ORDER — LISDEXAMFETAMINE 70 MG CAPSULE
ORAL_CAPSULE | 0 refills | 0 days | Status: CP
Start: 2022-08-24 — End: ?

## 2022-08-29 DIAGNOSIS — Z6841 Body Mass Index (BMI) 40.0 and over, adult: Principal | ICD-10-CM

## 2022-08-29 MED ORDER — WEGOVY 0.25 MG/0.5 ML SUBCUTANEOUS PEN INJECTOR
0 refills | 0 days | Status: CP
Start: 2022-08-29 — End: ?

## 2022-09-04 ENCOUNTER — Telehealth: Admit: 2022-09-04 | Discharge: 2022-09-05 | Payer: PRIVATE HEALTH INSURANCE

## 2022-09-04 DIAGNOSIS — F988 Other specified behavioral and emotional disorders with onset usually occurring in childhood and adolescence: Principal | ICD-10-CM

## 2022-09-04 DIAGNOSIS — F4310 Post-traumatic stress disorder, unspecified: Principal | ICD-10-CM

## 2022-09-04 MED ORDER — LISDEXAMFETAMINE 70 MG CAPSULE
ORAL_CAPSULE | Freq: Every morning | ORAL | 0 refills | 30 days | Status: CP
Start: 2022-09-04 — End: ?

## 2022-09-04 MED ORDER — VENLAFAXINE ER 150 MG CAPSULE,EXTENDED RELEASE 24 HR
ORAL_CAPSULE | Freq: Every day | ORAL | 5 refills | 30 days | Status: CP
Start: 2022-09-04 — End: 2022-10-04

## 2022-09-04 MED ORDER — VENLAFAXINE ER 75 MG CAPSULE,EXTENDED RELEASE 24 HR
ORAL_CAPSULE | Freq: Every day | ORAL | 5 refills | 30 days | Status: CP
Start: 2022-09-04 — End: 2022-12-03

## 2022-09-04 MED ORDER — ARIPIPRAZOLE 15 MG TABLET
ORAL_TABLET | Freq: Every day | ORAL | 5 refills | 30 days | Status: CP
Start: 2022-09-04 — End: ?

## 2022-09-19 ENCOUNTER — Ambulatory Visit
Admit: 2022-09-19 | Discharge: 2022-09-20 | Payer: PRIVATE HEALTH INSURANCE | Attending: Internal Medicine | Primary: Internal Medicine

## 2022-09-19 DIAGNOSIS — Z6841 Body Mass Index (BMI) 40.0 and over, adult: Principal | ICD-10-CM

## 2022-09-19 MED ORDER — WEGOVY 0.5 MG/0.5 ML SUBCUTANEOUS PEN INJECTOR
SUBCUTANEOUS | 0 refills | 0 days | Status: CP
Start: 2022-09-19 — End: ?

## 2022-10-02 ENCOUNTER — Ambulatory Visit
Admit: 2022-10-02 | Discharge: 2022-10-03 | Payer: PRIVATE HEALTH INSURANCE | Attending: Internal Medicine | Primary: Internal Medicine

## 2022-10-02 DIAGNOSIS — G8929 Other chronic pain: Principal | ICD-10-CM

## 2022-10-02 DIAGNOSIS — R87810 Cervical high risk human papillomavirus (HPV) DNA test positive: Principal | ICD-10-CM

## 2022-10-02 DIAGNOSIS — Z6841 Body Mass Index (BMI) 40.0 and over, adult: Principal | ICD-10-CM

## 2022-10-02 DIAGNOSIS — F419 Anxiety disorder, unspecified: Principal | ICD-10-CM

## 2022-10-02 DIAGNOSIS — Q796 Ehlers-Danlos syndrome, unspecified: Principal | ICD-10-CM

## 2022-10-02 DIAGNOSIS — M25561 Pain in right knee: Principal | ICD-10-CM

## 2022-10-02 DIAGNOSIS — E559 Vitamin D deficiency, unspecified: Principal | ICD-10-CM

## 2022-10-02 DIAGNOSIS — E038 Other specified hypothyroidism: Principal | ICD-10-CM

## 2022-10-02 MED ORDER — GABAPENTIN 300 MG CAPSULE
ORAL_CAPSULE | Freq: Three times a day (TID) | ORAL | 3 refills | 90 days | Status: CP
Start: 2022-10-02 — End: 2023-10-02

## 2022-10-19 MED ORDER — OMEPRAZOLE 20 MG CAPSULE,DELAYED RELEASE
ORAL_CAPSULE | Freq: Every day | ORAL | 3 refills | 90 days | Status: CP
Start: 2022-10-19 — End: 2023-10-19

## 2022-10-23 DIAGNOSIS — Z6841 Body Mass Index (BMI) 40.0 and over, adult: Principal | ICD-10-CM

## 2022-10-23 MED ORDER — LEVOTHYROXINE 50 MCG TABLET
ORAL_TABLET | 3 refills | 0 days | Status: CP
Start: 2022-10-23 — End: ?

## 2022-10-23 MED ORDER — WEGOVY 1 MG/0.5 ML SUBCUTANEOUS PEN INJECTOR
SUBCUTANEOUS | 0 refills | 0 days | Status: CP
Start: 2022-10-23 — End: ?

## 2022-10-30 DIAGNOSIS — F988 Other specified behavioral and emotional disorders with onset usually occurring in childhood and adolescence: Principal | ICD-10-CM

## 2022-10-30 MED ORDER — LISDEXAMFETAMINE 70 MG CAPSULE
ORAL_CAPSULE | 0 refills | 0 days | Status: CP
Start: 2022-10-30 — End: ?

## 2022-10-31 DIAGNOSIS — F988 Other specified behavioral and emotional disorders with onset usually occurring in childhood and adolescence: Principal | ICD-10-CM

## 2022-10-31 MED ORDER — LISDEXAMFETAMINE 70 MG CAPSULE
ORAL_CAPSULE | Freq: Every morning | ORAL | 0 refills | 30.00000 days | Status: CP
Start: 2022-10-31 — End: ?

## 2022-11-22 MED ORDER — WEGOVY 1.7 MG/0.75 ML SUBCUTANEOUS PEN INJECTOR
SUBCUTANEOUS | 0 refills | 7 days | Status: CP
Start: 2022-11-22 — End: ?

## 2022-12-04 ENCOUNTER — Telehealth
Admit: 2022-12-04 | Discharge: 2022-12-05 | Payer: BLUE CROSS/BLUE SHIELD | Attending: Internal Medicine | Primary: Internal Medicine

## 2022-12-04 DIAGNOSIS — G8929 Other chronic pain: Secondary | ICD-10-CM | POA: Diagnosis not present

## 2022-12-04 DIAGNOSIS — M25561 Pain in right knee: Secondary | ICD-10-CM | POA: Diagnosis not present

## 2022-12-04 DIAGNOSIS — Z6841 Body Mass Index (BMI) 40.0 and over, adult: Secondary | ICD-10-CM | POA: Diagnosis not present

## 2022-12-04 DIAGNOSIS — Q796 Ehlers-Danlos syndrome, unspecified: Secondary | ICD-10-CM | POA: Diagnosis not present

## 2022-12-04 DIAGNOSIS — M1380 Other specified arthritis, unspecified site: Secondary | ICD-10-CM | POA: Diagnosis not present

## 2022-12-04 DIAGNOSIS — M549 Dorsalgia, unspecified: Secondary | ICD-10-CM | POA: Diagnosis not present

## 2022-12-04 DIAGNOSIS — F32A Depression, unspecified depression type: Principal | ICD-10-CM

## 2022-12-04 DIAGNOSIS — E66813 Class 3 severe obesity with body mass index (BMI) of 40.0 to 44.9 in adult, unspecified obesity type, unspecified whether serious comorbidity present (CMS-HCC): Principal | ICD-10-CM

## 2022-12-04 MED ORDER — GABAPENTIN 300 MG CAPSULE
ORAL_CAPSULE | Freq: Three times a day (TID) | ORAL | 3 refills | 90 days | Status: CP
Start: 2022-12-04 — End: 2023-12-04

## 2022-12-18 ENCOUNTER — Telehealth
Admit: 2022-12-18 | Discharge: 2022-12-19 | Payer: BLUE CROSS/BLUE SHIELD | Attending: Internal Medicine | Primary: Internal Medicine

## 2022-12-18 DIAGNOSIS — E66813 Class 3 severe obesity with body mass index (BMI) of 40.0 to 44.9 in adult, unspecified obesity type, unspecified whether serious comorbidity present (CMS-HCC): Principal | ICD-10-CM

## 2022-12-18 DIAGNOSIS — M549 Dorsalgia, unspecified: Secondary | ICD-10-CM | POA: Diagnosis not present

## 2022-12-18 DIAGNOSIS — Z6841 Body Mass Index (BMI) 40.0 and over, adult: Secondary | ICD-10-CM | POA: Diagnosis not present

## 2022-12-18 DIAGNOSIS — M25561 Pain in right knee: Secondary | ICD-10-CM | POA: Diagnosis not present

## 2022-12-18 DIAGNOSIS — M1380 Other specified arthritis, unspecified site: Secondary | ICD-10-CM | POA: Diagnosis not present

## 2022-12-18 DIAGNOSIS — Q796 Ehlers-Danlos syndrome, unspecified: Secondary | ICD-10-CM | POA: Diagnosis not present

## 2022-12-18 DIAGNOSIS — G8929 Other chronic pain: Secondary | ICD-10-CM | POA: Diagnosis not present

## 2022-12-18 MED ORDER — CYCLOBENZAPRINE 5 MG TABLET
ORAL_TABLET | Freq: Three times a day (TID) | ORAL | 0 refills | 7 days | Status: CP | PRN
Start: 2022-12-18 — End: ?

## 2022-12-27 MED ORDER — TOPIRAMATE 50 MG TABLET
ORAL_TABLET | 6 refills | 0 days
Start: 2022-12-27 — End: ?

## 2022-12-27 MED ORDER — LORAZEPAM 0.5 MG TABLET
ORAL_TABLET | Freq: Three times a day (TID) | ORAL | 0 refills | 30 days | Status: CP | PRN
Start: 2022-12-27 — End: ?

## 2022-12-28 MED ORDER — TOPIRAMATE 50 MG TABLET
ORAL_TABLET | 6 refills | 0 days
Start: 2022-12-28 — End: ?

## 2022-12-29 MED ORDER — TOPIRAMATE 50 MG TABLET
ORAL_TABLET | Freq: Two times a day (BID) | ORAL | 3 refills | 90 days | Status: CP
Start: 2022-12-29 — End: 2023-12-29

## 2023-01-01 ENCOUNTER — Telehealth: Admit: 2023-01-01 | Discharge: 2023-01-02 | Payer: BLUE CROSS/BLUE SHIELD

## 2023-01-01 DIAGNOSIS — F4310 Post-traumatic stress disorder, unspecified: Secondary | ICD-10-CM | POA: Diagnosis not present

## 2023-01-01 DIAGNOSIS — F411 Generalized anxiety disorder: Secondary | ICD-10-CM | POA: Diagnosis not present

## 2023-01-01 DIAGNOSIS — F988 Other specified behavioral and emotional disorders with onset usually occurring in childhood and adolescence: Principal | ICD-10-CM

## 2023-01-01 MED ORDER — WEGOVY 2.4 MG/0.75 ML SUBCUTANEOUS PEN INJECTOR
SUBCUTANEOUS | 6 refills | 7.00 days | Status: CP
Start: 2023-01-01 — End: ?

## 2023-01-01 MED ORDER — LISDEXAMFETAMINE 70 MG CAPSULE
ORAL_CAPSULE | Freq: Every morning | ORAL | 0 refills | 30.00 days | Status: CP
Start: 2023-01-01 — End: ?

## 2023-01-26 MED ORDER — LORAZEPAM 0.5 MG TABLET
ORAL_TABLET | Freq: Three times a day (TID) | ORAL | 0 refills | 30 days | Status: CP | PRN
Start: 2023-01-26 — End: ?

## 2023-02-26 MED ORDER — LORAZEPAM 0.5 MG TABLET
ORAL_TABLET | Freq: Three times a day (TID) | ORAL | 0 refills | 30 days | Status: CP | PRN
Start: 2023-02-26 — End: ?

## 2023-02-28 DIAGNOSIS — F988 Other specified behavioral and emotional disorders with onset usually occurring in childhood and adolescence: Principal | ICD-10-CM

## 2023-02-28 MED ORDER — LISDEXAMFETAMINE 70 MG CAPSULE
ORAL_CAPSULE | Freq: Every morning | ORAL | 0 refills | 30.00 days | Status: CP
Start: 2023-02-28 — End: ?

## 2023-03-05 MED ORDER — CYCLOBENZAPRINE 5 MG TABLET
ORAL_TABLET | Freq: Three times a day (TID) | ORAL | 1 refills | 10.00 days | Status: CP | PRN
Start: 2023-03-05 — End: ?

## 2023-03-16 ENCOUNTER — Encounter: Admit: 2023-03-16 | Discharge: 2023-03-17 | Payer: BLUE CROSS/BLUE SHIELD

## 2023-03-16 DIAGNOSIS — F4310 Post-traumatic stress disorder, unspecified: Secondary | ICD-10-CM | POA: Diagnosis not present

## 2023-03-16 DIAGNOSIS — F411 Generalized anxiety disorder: Secondary | ICD-10-CM | POA: Diagnosis not present

## 2023-03-28 ENCOUNTER — Ambulatory Visit
Admit: 2023-03-28 | Discharge: 2023-03-29 | Payer: BLUE CROSS/BLUE SHIELD | Attending: Internal Medicine | Primary: Internal Medicine

## 2023-03-28 DIAGNOSIS — E66813 Class 3 severe obesity with body mass index (BMI) of 40.0 to 44.9 in adult, unspecified obesity type, unspecified whether serious comorbidity present (CMS-HCC): Principal | ICD-10-CM

## 2023-03-28 DIAGNOSIS — Z6839 Body mass index (BMI) 39.0-39.9, adult: Secondary | ICD-10-CM | POA: Diagnosis not present

## 2023-03-28 DIAGNOSIS — Z6841 Body Mass Index (BMI) 40.0 and over, adult: Secondary | ICD-10-CM | POA: Diagnosis not present

## 2023-03-28 MED ORDER — LORAZEPAM 0.5 MG TABLET
ORAL_TABLET | Freq: Three times a day (TID) | ORAL | 0 refills | 30 days | Status: CP | PRN
Start: 2023-03-28 — End: ?

## 2023-03-28 MED ORDER — WEGOVY 0.25 MG/0.5 ML SUBCUTANEOUS PEN INJECTOR
SUBCUTANEOUS | 0 refills | 0.00 days | Status: CP
Start: 2023-03-28 — End: ?

## 2023-04-12 DIAGNOSIS — F988 Other specified behavioral and emotional disorders with onset usually occurring in childhood and adolescence: Principal | ICD-10-CM

## 2023-04-12 MED ORDER — LISDEXAMFETAMINE 70 MG CAPSULE
ORAL_CAPSULE | Freq: Every morning | ORAL | 0 refills | 30.00 days | Status: CP
Start: 2023-04-12 — End: ?

## 2023-04-12 MED ORDER — CYCLOBENZAPRINE 5 MG TABLET
ORAL_TABLET | Freq: Three times a day (TID) | ORAL | 1 refills | 10.00 days | Status: CP | PRN
Start: 2023-04-12 — End: ?

## 2023-04-27 MED ORDER — LORAZEPAM 0.5 MG TABLET
ORAL_TABLET | Freq: Three times a day (TID) | ORAL | 0 refills | 30 days | Status: CP | PRN
Start: 2023-04-27 — End: ?

## 2023-04-29 MED ORDER — WEGOVY 0.5 MG/0.5 ML SUBCUTANEOUS PEN INJECTOR
SUBCUTANEOUS | 0 refills | 0.00 days | Status: CP
Start: 2023-04-29 — End: ?

## 2023-05-11 ENCOUNTER — Ambulatory Visit
Admit: 2023-05-11 | Discharge: 2023-05-12 | Payer: Medicaid (Managed Care) | Attending: Rehabilitative and Restorative Service Providers" | Primary: Rehabilitative and Restorative Service Providers"

## 2023-05-11 DIAGNOSIS — S83511D Sprain of anterior cruciate ligament of right knee, subsequent encounter: Secondary | ICD-10-CM | POA: Diagnosis not present

## 2023-05-11 DIAGNOSIS — M1711 Unilateral primary osteoarthritis, right knee: Secondary | ICD-10-CM | POA: Diagnosis not present

## 2023-05-11 MED ORDER — CELECOXIB 200 MG CAPSULE
ORAL_CAPSULE | Freq: Two times a day (BID) | ORAL | 1 refills | 30.00 days | Status: CP
Start: 2023-05-11 — End: ?

## 2023-05-15 DIAGNOSIS — F988 Other specified behavioral and emotional disorders with onset usually occurring in childhood and adolescence: Principal | ICD-10-CM

## 2023-05-15 MED ORDER — LEVOTHYROXINE 50 MCG TABLET
ORAL_TABLET | 3 refills | 0.00 days | Status: CP
Start: 2023-05-15 — End: ?

## 2023-05-15 MED ORDER — LISDEXAMFETAMINE 70 MG CAPSULE
ORAL_CAPSULE | Freq: Every morning | ORAL | 0 refills | 30.00 days | Status: CP
Start: 2023-05-15 — End: ?

## 2023-05-17 ENCOUNTER — Encounter: Admit: 2023-05-17 | Discharge: 2023-05-18 | Payer: Medicaid (Managed Care)

## 2023-05-17 DIAGNOSIS — F4310 Post-traumatic stress disorder, unspecified: Secondary | ICD-10-CM | POA: Diagnosis not present

## 2023-05-17 DIAGNOSIS — F411 Generalized anxiety disorder: Secondary | ICD-10-CM | POA: Diagnosis not present

## 2023-05-24 MED ORDER — WEGOVY 1 MG/0.5 ML SUBCUTANEOUS PEN INJECTOR
SUBCUTANEOUS | 0 refills | 0.00000 days | Status: CP
Start: 2023-05-24 — End: ?

## 2023-05-28 ENCOUNTER — Ambulatory Visit: Admit: 2023-05-28 | Discharge: 2023-05-29 | Payer: Medicaid (Managed Care)

## 2023-05-28 DIAGNOSIS — R87810 Cervical high risk human papillomavirus (HPV) DNA test positive: Secondary | ICD-10-CM | POA: Diagnosis not present

## 2023-05-28 MED ORDER — ARIPIPRAZOLE 15 MG TABLET
ORAL_TABLET | Freq: Every day | ORAL | 5 refills | 30.00000 days | Status: CP
Start: 2023-05-28 — End: ?

## 2023-06-06 ENCOUNTER — Inpatient Hospital Stay: Admit: 2023-06-06 | Discharge: 2023-06-07 | Payer: Medicaid (Managed Care)

## 2023-06-06 ENCOUNTER — Ambulatory Visit: Admit: 2023-06-06 | Discharge: 2023-06-07 | Payer: Medicaid (Managed Care)

## 2023-06-06 DIAGNOSIS — M25569 Pain in unspecified knee: Secondary | ICD-10-CM | POA: Diagnosis not present

## 2023-06-06 DIAGNOSIS — Z6841 Body Mass Index (BMI) 40.0 and over, adult: Secondary | ICD-10-CM | POA: Diagnosis not present

## 2023-06-06 DIAGNOSIS — Z885 Allergy status to narcotic agent status: Secondary | ICD-10-CM | POA: Diagnosis not present

## 2023-06-06 DIAGNOSIS — Z79899 Other long term (current) drug therapy: Secondary | ICD-10-CM | POA: Diagnosis not present

## 2023-06-06 DIAGNOSIS — I7300 Raynaud's syndrome without gangrene: Secondary | ICD-10-CM | POA: Diagnosis not present

## 2023-06-06 DIAGNOSIS — M4726 Other spondylosis with radiculopathy, lumbar region: Secondary | ICD-10-CM | POA: Diagnosis not present

## 2023-06-06 DIAGNOSIS — G8929 Other chronic pain: Secondary | ICD-10-CM | POA: Diagnosis not present

## 2023-06-06 DIAGNOSIS — Z932 Ileostomy status: Secondary | ICD-10-CM | POA: Diagnosis not present

## 2023-06-06 DIAGNOSIS — M4316 Spondylolisthesis, lumbar region: Secondary | ICD-10-CM | POA: Diagnosis not present

## 2023-06-06 DIAGNOSIS — M5416 Radiculopathy, lumbar region: Secondary | ICD-10-CM | POA: Diagnosis not present

## 2023-06-06 DIAGNOSIS — M549 Dorsalgia, unspecified: Secondary | ICD-10-CM | POA: Diagnosis not present

## 2023-06-06 DIAGNOSIS — Q796 Ehlers-Danlos syndrome, unspecified: Secondary | ICD-10-CM | POA: Diagnosis not present

## 2023-06-06 DIAGNOSIS — R29898 Other symptoms and signs involving the musculoskeletal system: Secondary | ICD-10-CM | POA: Diagnosis not present

## 2023-06-06 DIAGNOSIS — K219 Gastro-esophageal reflux disease without esophagitis: Secondary | ICD-10-CM | POA: Diagnosis not present

## 2023-06-06 DIAGNOSIS — Z5982 Transportation insecurity: Secondary | ICD-10-CM | POA: Diagnosis not present

## 2023-06-06 DIAGNOSIS — M064 Inflammatory polyarthropathy: Secondary | ICD-10-CM | POA: Diagnosis not present

## 2023-06-06 DIAGNOSIS — Z818 Family history of other mental and behavioral disorders: Secondary | ICD-10-CM | POA: Diagnosis not present

## 2023-06-06 DIAGNOSIS — F410 Panic disorder [episodic paroxysmal anxiety] without agoraphobia: Secondary | ICD-10-CM | POA: Diagnosis not present

## 2023-06-13 ENCOUNTER — Ambulatory Visit
Admit: 2023-06-13 | Discharge: 2023-06-14 | Payer: Medicaid (Managed Care) | Attending: Obstetrics & Gynecology | Primary: Obstetrics & Gynecology

## 2023-06-13 DIAGNOSIS — R87618 Other abnormal cytological findings on specimens from cervix uteri: Secondary | ICD-10-CM | POA: Diagnosis not present

## 2023-06-13 DIAGNOSIS — N879 Dysplasia of cervix uteri, unspecified: Secondary | ICD-10-CM | POA: Diagnosis not present

## 2023-06-25 MED ORDER — WEGOVY 1.7 MG/0.75 ML SUBCUTANEOUS PEN INJECTOR
SUBCUTANEOUS | 2 refills | 28.00000 days | Status: CP
Start: 2023-06-25 — End: ?

## 2023-06-28 DIAGNOSIS — F988 Other specified behavioral and emotional disorders with onset usually occurring in childhood and adolescence: Principal | ICD-10-CM

## 2023-06-28 MED ORDER — LORAZEPAM 0.5 MG TABLET
ORAL_TABLET | Freq: Three times a day (TID) | ORAL | 0 refills | 10.00000 days | Status: CP | PRN
Start: 2023-06-28 — End: ?

## 2023-06-28 MED ORDER — CELECOXIB 200 MG CAPSULE
ORAL_CAPSULE | Freq: Two times a day (BID) | ORAL | 1 refills | 0.00000 days
Start: 2023-06-28 — End: ?

## 2023-06-28 MED ORDER — LISDEXAMFETAMINE 70 MG CAPSULE
ORAL_CAPSULE | ORAL | 0 refills | 0.00000 days | Status: CP
Start: 2023-06-28 — End: ?

## 2023-07-02 MED ORDER — CELECOXIB 200 MG CAPSULE
ORAL_CAPSULE | Freq: Two times a day (BID) | ORAL | 1 refills | 30.00000 days | Status: CP
Start: 2023-07-02 — End: ?

## 2023-07-12 DIAGNOSIS — F4310 Post-traumatic stress disorder, unspecified: Principal | ICD-10-CM

## 2023-07-12 DIAGNOSIS — F411 Generalized anxiety disorder: Principal | ICD-10-CM

## 2023-07-12 MED ORDER — VILAZODONE 20 MG TABLET
ORAL_TABLET | Freq: Every day | ORAL | 3 refills | 90.00000 days | Status: CP
Start: 2023-07-12 — End: 2024-08-15

## 2023-07-13 ENCOUNTER — Inpatient Hospital Stay: Admit: 2023-07-13 | Discharge: 2023-07-13 | Payer: Medicaid (Managed Care)

## 2023-07-18 ENCOUNTER — Ambulatory Visit: Admit: 2023-07-18 | Discharge: 2023-07-19 | Payer: Medicaid (Managed Care)

## 2023-07-18 DIAGNOSIS — M5416 Radiculopathy, lumbar region: Principal | ICD-10-CM

## 2023-07-23 MED ORDER — WEGOVY 2.4 MG/0.75 ML SUBCUTANEOUS PEN INJECTOR
SUBCUTANEOUS | 4 refills | 28.00000 days | Status: CP
Start: 2023-07-23 — End: ?

## 2023-07-26 DIAGNOSIS — F988 Other specified behavioral and emotional disorders with onset usually occurring in childhood and adolescence: Principal | ICD-10-CM

## 2023-07-26 DIAGNOSIS — F411 Generalized anxiety disorder: Principal | ICD-10-CM

## 2023-07-26 DIAGNOSIS — F4310 Post-traumatic stress disorder, unspecified: Principal | ICD-10-CM

## 2023-07-26 MED ORDER — LORAZEPAM 0.5 MG TABLET
ORAL_TABLET | Freq: Three times a day (TID) | ORAL | 0 refills | 0.00000 days | PRN
Start: 2023-07-26 — End: ?

## 2023-07-26 MED ORDER — LISDEXAMFETAMINE 70 MG CAPSULE
ORAL_CAPSULE | Freq: Every morning | ORAL | 0 refills | 0.00000 days
Start: 2023-07-26 — End: ?

## 2023-08-13 ENCOUNTER — Encounter
Admit: 2023-08-13 | Discharge: 2023-08-13 | Payer: Medicaid (Managed Care) | Attending: Internal Medicine | Primary: Internal Medicine

## 2023-08-13 DIAGNOSIS — M5440 Lumbago with sciatica, unspecified side: Principal | ICD-10-CM

## 2023-08-13 DIAGNOSIS — Q796 Ehlers-Danlos syndrome, unspecified: Principal | ICD-10-CM

## 2023-08-13 DIAGNOSIS — Z136 Encounter for screening for cardiovascular disorders: Principal | ICD-10-CM

## 2023-08-13 DIAGNOSIS — L0390 Cellulitis, unspecified: Principal | ICD-10-CM

## 2023-08-13 DIAGNOSIS — L03011 Cellulitis of right finger: Principal | ICD-10-CM

## 2023-08-13 DIAGNOSIS — Z6836 Body mass index (BMI) 36.0-36.9, adult: Principal | ICD-10-CM

## 2023-08-13 DIAGNOSIS — E559 Vitamin D deficiency, unspecified: Principal | ICD-10-CM

## 2023-08-13 DIAGNOSIS — R609 Edema, unspecified: Principal | ICD-10-CM

## 2023-08-13 DIAGNOSIS — R87810 Cervical high risk human papillomavirus (HPV) DNA test positive: Principal | ICD-10-CM

## 2023-08-13 DIAGNOSIS — E038 Other specified hypothyroidism: Principal | ICD-10-CM

## 2023-08-13 DIAGNOSIS — L03012 Cellulitis of left finger: Principal | ICD-10-CM

## 2023-08-13 DIAGNOSIS — Z6841 Body Mass Index (BMI) 40.0 and over, adult: Principal | ICD-10-CM

## 2023-08-13 DIAGNOSIS — D508 Other iron deficiency anemias: Principal | ICD-10-CM

## 2023-08-13 DIAGNOSIS — Z0000 Encounter for general adult medical examination without abnormal findings: Principal | ICD-10-CM

## 2023-08-13 DIAGNOSIS — Z1239 Encounter for other screening for malignant neoplasm of breast: Principal | ICD-10-CM

## 2023-08-13 DIAGNOSIS — E66813 Class 3 severe obesity with body mass index (BMI) of 40.0 to 44.9 in adult, unspecified obesity type, unspecified whether serious comorbidity present (CMS-HCC): Principal | ICD-10-CM

## 2023-08-13 MED ORDER — DOXYCYCLINE HYCLATE 100 MG TABLET
ORAL_TABLET | Freq: Two times a day (BID) | ORAL | 0 refills | 7.00000 days | Status: CP
Start: 2023-08-13 — End: 2023-08-20

## 2023-08-13 MED ORDER — FOLIC ACID 1 MG TABLET
ORAL_TABLET | Freq: Every day | ORAL | 3 refills | 90.00000 days | Status: CP
Start: 2023-08-13 — End: ?

## 2023-08-13 MED ORDER — ZEPBOUND 5 MG/0.5 ML SUBCUTANEOUS PEN INJECTOR
SUBCUTANEOUS | 0 refills | 0.00000 days | Status: CP
Start: 2023-08-13 — End: ?

## 2023-08-13 MED ORDER — CYCLOBENZAPRINE 5 MG TABLET
ORAL_TABLET | Freq: Three times a day (TID) | ORAL | 3 refills | 30.00000 days | Status: CP | PRN
Start: 2023-08-13 — End: 2024-08-12

## 2023-08-13 MED ORDER — CHOLECALCIFEROL (VITAMIN D3) 1,250 MCG (50,000 UNIT) TABLET
ORAL_TABLET | ORAL | 3 refills | 84.00000 days | Status: CP
Start: 2023-08-13 — End: 2024-08-12

## 2023-08-13 MED ORDER — ONDANSETRON HCL 4 MG TABLET
ORAL_TABLET | Freq: Every day | ORAL | 2 refills | 30.00000 days | Status: CP | PRN
Start: 2023-08-13 — End: 2024-08-12

## 2023-08-14 DIAGNOSIS — R7401 Elevated ALT measurement: Principal | ICD-10-CM

## 2023-08-23 DIAGNOSIS — E66813 Class 3 severe obesity with body mass index (BMI) of 40.0 to 44.9 in adult, unspecified obesity type, unspecified whether serious comorbidity present (CMS-HCC): Principal | ICD-10-CM

## 2023-08-23 DIAGNOSIS — Z6841 Body Mass Index (BMI) 40.0 and over, adult: Principal | ICD-10-CM

## 2023-08-23 MED ORDER — ZEPBOUND 5 MG/0.5 ML SUBCUTANEOUS PEN INJECTOR
0 refills | 0.00000 days
Start: 2023-08-23 — End: ?

## 2023-08-24 ENCOUNTER — Inpatient Hospital Stay: Admit: 2023-08-24 | Discharge: 2023-08-24 | Payer: Medicaid (Managed Care)

## 2023-08-27 ENCOUNTER — Ambulatory Visit
Admit: 2023-08-27 | Discharge: 2023-08-28 | Payer: Medicaid (Managed Care) | Attending: Rehabilitative and Restorative Service Providers" | Primary: Rehabilitative and Restorative Service Providers"

## 2023-08-27 DIAGNOSIS — M549 Dorsalgia, unspecified: Principal | ICD-10-CM

## 2023-08-27 DIAGNOSIS — M1711 Unilateral primary osteoarthritis, right knee: Principal | ICD-10-CM

## 2023-08-27 DIAGNOSIS — F988 Other specified behavioral and emotional disorders with onset usually occurring in childhood and adolescence: Principal | ICD-10-CM

## 2023-08-27 MED ORDER — LIDOCAINE 5 % TOPICAL PATCH
MEDICATED_PATCH | TRANSDERMAL | 4 refills | 30.00000 days | Status: CP
Start: 2023-08-27 — End: 2023-09-26

## 2023-08-27 MED ORDER — LISDEXAMFETAMINE 70 MG CAPSULE
ORAL_CAPSULE | Freq: Every morning | ORAL | 0 refills | 30.00000 days | Status: CP
Start: 2023-08-27 — End: ?

## 2023-08-27 MED ORDER — DICLOFENAC 1 % TOPICAL GEL
Freq: Four times a day (QID) | TOPICAL | 4 refills | 13.00000 days | Status: CP | PRN
Start: 2023-08-27 — End: 2024-08-26

## 2023-08-27 MED ORDER — DOXYCYCLINE HYCLATE 100 MG TABLET
ORAL_TABLET | Freq: Two times a day (BID) | ORAL | 0 refills | 7.00000 days | Status: CP
Start: 2023-08-27 — End: 2023-09-03

## 2023-08-27 MED ORDER — CEPHALEXIN 500 MG CAPSULE
ORAL_CAPSULE | Freq: Three times a day (TID) | ORAL | 0 refills | 10.00000 days | Status: CP
Start: 2023-08-27 — End: 2023-09-06

## 2023-08-27 MED ORDER — LORAZEPAM 0.5 MG TABLET
ORAL_TABLET | Freq: Three times a day (TID) | ORAL | 0 refills | 10.00000 days | Status: CP | PRN
Start: 2023-08-27 — End: ?

## 2023-08-28 DIAGNOSIS — R7401 Elevated ALT measurement: Principal | ICD-10-CM

## 2023-08-29 DIAGNOSIS — F411 Generalized anxiety disorder: Principal | ICD-10-CM

## 2023-08-29 DIAGNOSIS — F4310 Post-traumatic stress disorder, unspecified: Principal | ICD-10-CM

## 2023-09-12 ENCOUNTER — Inpatient Hospital Stay: Admit: 2023-09-12 | Discharge: 2023-09-12 | Payer: Medicaid (Managed Care)

## 2023-09-12 DIAGNOSIS — R7401 Elevated ALT measurement: Principal | ICD-10-CM

## 2023-09-13 DIAGNOSIS — R932 Abnormal findings on diagnostic imaging of liver and biliary tract: Principal | ICD-10-CM

## 2023-09-13 DIAGNOSIS — R7401 Elevated ALT measurement: Principal | ICD-10-CM

## 2023-09-18 MED ORDER — ZEPBOUND 7.5 MG/0.5 ML SUBCUTANEOUS PEN INJECTOR
SUBCUTANEOUS | 0 refills | 0.00000 days | Status: CP
Start: 2023-09-18 — End: ?

## 2023-09-21 ENCOUNTER — Ambulatory Visit: Admit: 2023-09-21 | Discharge: 2023-09-22 | Payer: Medicaid (Managed Care)

## 2023-09-26 DIAGNOSIS — F988 Other specified behavioral and emotional disorders with onset usually occurring in childhood and adolescence: Principal | ICD-10-CM

## 2023-09-26 DIAGNOSIS — R7401 Elevated ALT measurement: Principal | ICD-10-CM

## 2023-09-26 MED ORDER — LORAZEPAM 0.5 MG TABLET
ORAL_TABLET | Freq: Three times a day (TID) | ORAL | 2 refills | 10.00000 days | Status: CP | PRN
Start: 2023-09-26 — End: ?

## 2023-09-26 MED ORDER — LISDEXAMFETAMINE 70 MG CAPSULE
ORAL_CAPSULE | ORAL | 0 refills | 0.00000 days | Status: CP
Start: 2023-09-26 — End: ?

## 2023-10-05 DIAGNOSIS — Q796 Ehlers-Danlos syndrome, unspecified: Principal | ICD-10-CM

## 2023-10-05 DIAGNOSIS — D508 Other iron deficiency anemias: Principal | ICD-10-CM

## 2023-10-05 DIAGNOSIS — L03011 Cellulitis of right finger: Principal | ICD-10-CM

## 2023-10-05 DIAGNOSIS — M5440 Lumbago with sciatica, unspecified side: Principal | ICD-10-CM

## 2023-10-05 DIAGNOSIS — E559 Vitamin D deficiency, unspecified: Principal | ICD-10-CM

## 2023-10-05 DIAGNOSIS — L03012 Cellulitis of left finger: Principal | ICD-10-CM

## 2023-10-05 DIAGNOSIS — E038 Other specified hypothyroidism: Principal | ICD-10-CM

## 2023-10-05 DIAGNOSIS — R928 Other abnormal and inconclusive findings on diagnostic imaging of breast: Principal | ICD-10-CM

## 2023-10-05 DIAGNOSIS — Z0000 Encounter for general adult medical examination without abnormal findings: Principal | ICD-10-CM

## 2023-10-05 DIAGNOSIS — Z6836 Body mass index (BMI) 36.0-36.9, adult: Principal | ICD-10-CM

## 2023-10-05 DIAGNOSIS — R87810 Cervical high risk human papillomavirus (HPV) DNA test positive: Principal | ICD-10-CM

## 2023-10-05 DIAGNOSIS — R7401 Elevated ALT measurement: Principal | ICD-10-CM

## 2023-10-05 DIAGNOSIS — Z136 Encounter for screening for cardiovascular disorders: Principal | ICD-10-CM

## 2023-10-15 DIAGNOSIS — F4310 Post-traumatic stress disorder, unspecified: Principal | ICD-10-CM

## 2023-10-15 DIAGNOSIS — F411 Generalized anxiety disorder: Principal | ICD-10-CM

## 2023-10-15 MED ORDER — VILAZODONE 40 MG TABLET
ORAL_TABLET | Freq: Every day | ORAL | 3 refills | 90.00000 days | Status: CP
Start: 2023-10-15 — End: 2024-11-18

## 2023-10-24 ENCOUNTER — Ambulatory Visit: Admitting: Pain Medicine

## 2023-10-25 DIAGNOSIS — F988 Other specified behavioral and emotional disorders with onset usually occurring in childhood and adolescence: Principal | ICD-10-CM

## 2023-10-25 MED ORDER — LISDEXAMFETAMINE 70 MG CAPSULE
ORAL_CAPSULE | ORAL | 0 refills | 0.00000 days | Status: CP
Start: 2023-10-25 — End: ?

## 2023-10-25 MED ORDER — OMEPRAZOLE 20 MG CAPSULE,DELAYED RELEASE
ORAL_CAPSULE | ORAL | 3 refills | 0.00000 days | Status: CP
Start: 2023-10-25 — End: ?

## 2023-11-29 DIAGNOSIS — F988 Other specified behavioral and emotional disorders with onset usually occurring in childhood and adolescence: Principal | ICD-10-CM

## 2023-11-29 MED ORDER — LISDEXAMFETAMINE 70 MG CAPSULE
ORAL_CAPSULE | ORAL | 0 refills | 0.00000 days | Status: CP
Start: 2023-11-29 — End: ?

## 2023-11-29 MED ORDER — CELECOXIB 200 MG CAPSULE
ORAL_CAPSULE | Freq: Two times a day (BID) | ORAL | 1 refills | 0.00000 days
Start: 2023-11-29 — End: ?

## 2023-11-29 MED ORDER — ARIPIPRAZOLE 15 MG TABLET
ORAL_TABLET | Freq: Every day | ORAL | 5 refills | 30.00000 days | Status: CP
Start: 2023-11-29 — End: ?

## 2023-11-30 ENCOUNTER — Inpatient Hospital Stay: Admit: 2023-11-30 | Discharge: 2023-11-30 | Payer: Medicaid (Managed Care)

## 2023-11-30 MED ORDER — CELECOXIB 200 MG CAPSULE
ORAL_CAPSULE | Freq: Two times a day (BID) | ORAL | 1 refills | 30.00000 days | Status: CP
Start: 2023-11-30 — End: ?

## 2023-12-10 DIAGNOSIS — F411 Generalized anxiety disorder: Principal | ICD-10-CM

## 2023-12-10 DIAGNOSIS — F4310 Post-traumatic stress disorder, unspecified: Principal | ICD-10-CM

## 2023-12-10 DIAGNOSIS — F988 Other specified behavioral and emotional disorders with onset usually occurring in childhood and adolescence: Principal | ICD-10-CM

## 2023-12-18 DIAGNOSIS — M25561 Pain in right knee: Principal | ICD-10-CM

## 2023-12-18 DIAGNOSIS — G8929 Other chronic pain: Principal | ICD-10-CM

## 2023-12-28 DIAGNOSIS — F988 Other specified behavioral and emotional disorders with onset usually occurring in childhood and adolescence: Principal | ICD-10-CM

## 2023-12-28 MED ORDER — LISDEXAMFETAMINE 70 MG CAPSULE
ORAL_CAPSULE | ORAL | 0 refills | 0.00000 days | Status: CP
Start: 2023-12-28 — End: ?

## 2023-12-28 MED ORDER — ONDANSETRON HCL 4 MG TABLET
ORAL_TABLET | 2 refills | 0.00000 days
Start: 2023-12-28 — End: ?

## 2023-12-28 MED ORDER — LORAZEPAM 0.5 MG TABLET
ORAL_TABLET | Freq: Three times a day (TID) | ORAL | 0 refills | 10.00000 days | Status: CP | PRN
Start: 2023-12-28 — End: ?

## 2023-12-31 MED ORDER — ONDANSETRON HCL 4 MG TABLET
ORAL_TABLET | 2 refills | 0.00000 days | Status: CP
Start: 2023-12-31 — End: ?

## 2024-01-02 MED ORDER — GABAPENTIN 300 MG CAPSULE
ORAL_CAPSULE | 3 refills | 0.00000 days
Start: 2024-01-02 — End: ?

## 2024-01-02 MED ORDER — ONDANSETRON HCL 4 MG TABLET
ORAL_TABLET | Freq: Every day | ORAL | 2 refills | 30.00000 days | PRN
Start: 2024-01-02 — End: ?

## 2024-01-03 MED ORDER — ONDANSETRON HCL 4 MG TABLET
ORAL_TABLET | Freq: Every day | ORAL | 2 refills | 30.00000 days | Status: CP | PRN
Start: 2024-01-03 — End: ?

## 2024-01-03 MED ORDER — GABAPENTIN 300 MG CAPSULE
ORAL_CAPSULE | ORAL | 3 refills | 0.00000 days | Status: CP
Start: 2024-01-03 — End: ?

## 2024-01-29 DIAGNOSIS — F988 Other specified behavioral and emotional disorders with onset usually occurring in childhood and adolescence: Principal | ICD-10-CM

## 2024-01-29 MED ORDER — LISDEXAMFETAMINE 70 MG CAPSULE
ORAL_CAPSULE | ORAL | 0 refills | 0.00000 days | Status: CP
Start: 2024-01-29 — End: ?

## 2024-01-29 MED ORDER — CELECOXIB 200 MG CAPSULE
ORAL_CAPSULE | Freq: Two times a day (BID) | ORAL | 1 refills | 0.00000 days
Start: 2024-01-29 — End: ?

## 2024-01-29 MED ORDER — LORAZEPAM 0.5 MG TABLET
ORAL_TABLET | Freq: Three times a day (TID) | ORAL | 3 refills | 10.00000 days | Status: CP | PRN
Start: 2024-01-29 — End: ?

## 2024-01-31 MED ORDER — CELECOXIB 200 MG CAPSULE
ORAL_CAPSULE | Freq: Two times a day (BID) | ORAL | 1 refills | 30.00000 days | Status: CP
Start: 2024-01-31 — End: ?

## 2024-02-05 DIAGNOSIS — F4310 Post-traumatic stress disorder, unspecified: Principal | ICD-10-CM

## 2024-02-05 DIAGNOSIS — F411 Generalized anxiety disorder: Principal | ICD-10-CM

## 2024-02-05 DIAGNOSIS — R41840 Attention and concentration deficit: Principal | ICD-10-CM

## 2024-02-05 MED ORDER — LURASIDONE 40 MG TABLET
ORAL_TABLET | Freq: Every day | ORAL | 1 refills | 30.00000 days | Status: CP
Start: 2024-02-05 — End: ?

## 2024-02-06 MED ORDER — TOPIRAMATE 50 MG TABLET
ORAL_TABLET | Freq: Two times a day (BID) | ORAL | 3 refills | 90.00000 days | Status: CP
Start: 2024-02-06 — End: 2025-02-05

## 2024-02-18 DIAGNOSIS — M79675 Pain in left toe(s): Secondary | ICD-10-CM

## 2024-02-18 DIAGNOSIS — M549 Dorsalgia, unspecified: Secondary | ICD-10-CM

## 2024-02-18 DIAGNOSIS — M67911 Unspecified disorder of synovium and tendon, right shoulder: Secondary | ICD-10-CM

## 2024-02-18 DIAGNOSIS — Z6836 Body mass index (BMI) 36.0-36.9, adult: Secondary | ICD-10-CM

## 2024-02-18 DIAGNOSIS — Q796 Ehlers-Danlos syndrome, unspecified: Secondary | ICD-10-CM

## 2024-02-18 DIAGNOSIS — M542 Cervicalgia: Secondary | ICD-10-CM

## 2024-02-18 DIAGNOSIS — F3289 Other specified depressive episodes: Secondary | ICD-10-CM

## 2024-02-18 DIAGNOSIS — G8929 Other chronic pain: Secondary | ICD-10-CM

## 2024-02-18 DIAGNOSIS — Z780 Asymptomatic menopausal state: Principal | ICD-10-CM

## 2024-02-18 DIAGNOSIS — N951 Menopausal and female climacteric states: Secondary | ICD-10-CM

## 2024-02-18 DIAGNOSIS — G629 Polyneuropathy, unspecified: Secondary | ICD-10-CM

## 2024-02-18 DIAGNOSIS — M7989 Other specified soft tissue disorders: Secondary | ICD-10-CM

## 2024-02-18 DIAGNOSIS — M25561 Pain in right knee: Secondary | ICD-10-CM

## 2024-02-18 DIAGNOSIS — E038 Other specified hypothyroidism: Secondary | ICD-10-CM

## 2024-02-18 MED ORDER — PREMPRO 0.3 MG-1.5 MG TABLET
ORAL_TABLET | Freq: Every day | ORAL | 11 refills | 30.00000 days | Status: CP
Start: 2024-02-18 — End: 2025-02-17

## 2024-02-21 ENCOUNTER — Encounter
Admit: 2024-02-21 | Discharge: 2024-02-22 | Payer: Medicaid (Managed Care) | Attending: Registered" | Primary: Registered"

## 2024-02-25 DIAGNOSIS — Z6841 Body Mass Index (BMI) 40.0 and over, adult: Secondary | ICD-10-CM

## 2024-02-25 DIAGNOSIS — R609 Edema, unspecified: Principal | ICD-10-CM

## 2024-02-25 DIAGNOSIS — E66813 Class 3 severe obesity with body mass index (BMI) of 40.0 to 44.9 in adult, unspecified obesity type, unspecified whether serious comorbidity present (CMS-HCC): Secondary | ICD-10-CM

## 2024-02-25 MED ORDER — ZEPBOUND 2.5 MG/0.5 ML SUBCUTANEOUS PEN INJECTOR
SUBCUTANEOUS | 0 refills | 28.00000 days | Status: CP
Start: 2024-02-25 — End: ?
